# Patient Record
Sex: Male | Born: 1955 | Race: Black or African American | Hispanic: No | Marital: Single | Smoking: Former smoker
Health system: Southern US, Community
[De-identification: ages and names within clinical notes are randomized; demographics above are authoritative.]

## PROBLEM LIST (undated history)

## (undated) DIAGNOSIS — J349 Unspecified disorder of nose and nasal sinuses: Secondary | ICD-10-CM

## (undated) DIAGNOSIS — M549 Dorsalgia, unspecified: Secondary | ICD-10-CM

## (undated) DIAGNOSIS — I10 Essential (primary) hypertension: Secondary | ICD-10-CM

## (undated) DIAGNOSIS — M1990 Unspecified osteoarthritis, unspecified site: Secondary | ICD-10-CM

## (undated) DIAGNOSIS — R609 Edema, unspecified: Secondary | ICD-10-CM

## (undated) DIAGNOSIS — E119 Type 2 diabetes mellitus without complications: Secondary | ICD-10-CM

## (undated) DIAGNOSIS — T7840XA Allergy, unspecified, initial encounter: Secondary | ICD-10-CM

## (undated) HISTORY — PX: NO PAST SURGERIES: SHX2092

---

## 2000-11-26 ENCOUNTER — Encounter: Payer: Self-pay | Admitting: Emergency Medicine

## 2000-11-26 ENCOUNTER — Emergency Department (HOSPITAL_COMMUNITY): Admission: EM | Admit: 2000-11-26 | Discharge: 2000-11-26 | Payer: Self-pay | Admitting: Emergency Medicine

## 2008-07-15 ENCOUNTER — Emergency Department (HOSPITAL_COMMUNITY): Admission: EM | Admit: 2008-07-15 | Discharge: 2008-07-15 | Payer: Self-pay | Admitting: Emergency Medicine

## 2013-10-04 ENCOUNTER — Emergency Department (HOSPITAL_COMMUNITY): Payer: Medicaid Other

## 2013-10-04 ENCOUNTER — Observation Stay (HOSPITAL_COMMUNITY)
Admission: EM | Admit: 2013-10-04 | Discharge: 2013-10-06 | Disposition: A | Discharge status: Home / Self Care | Payer: Medicaid Other | Attending: Internal Medicine | Admitting: Internal Medicine

## 2013-10-04 ENCOUNTER — Encounter (HOSPITAL_COMMUNITY): Payer: Self-pay | Admitting: Emergency Medicine

## 2013-10-04 DIAGNOSIS — E43 Unspecified severe protein-calorie malnutrition: Secondary | ICD-10-CM | POA: Diagnosis present

## 2013-10-04 DIAGNOSIS — R5381 Other malaise: Secondary | ICD-10-CM | POA: Diagnosis not present

## 2013-10-04 DIAGNOSIS — L4050 Arthropathic psoriasis, unspecified: Secondary | ICD-10-CM

## 2013-10-04 DIAGNOSIS — IMO0002 Reserved for concepts with insufficient information to code with codable children: Secondary | ICD-10-CM

## 2013-10-04 DIAGNOSIS — R110 Nausea: Secondary | ICD-10-CM | POA: Insufficient documentation

## 2013-10-04 DIAGNOSIS — E860 Dehydration: Secondary | ICD-10-CM | POA: Diagnosis present

## 2013-10-04 DIAGNOSIS — R509 Fever, unspecified: Secondary | ICD-10-CM

## 2013-10-04 DIAGNOSIS — L511 Stevens-Johnson syndrome: Secondary | ICD-10-CM

## 2013-10-04 DIAGNOSIS — Z720 Tobacco use: Secondary | ICD-10-CM | POA: Insufficient documentation

## 2013-10-04 DIAGNOSIS — Z87891 Personal history of nicotine dependence: Secondary | ICD-10-CM | POA: Diagnosis not present

## 2013-10-04 DIAGNOSIS — R5383 Other fatigue: Secondary | ICD-10-CM

## 2013-10-04 DIAGNOSIS — E876 Hypokalemia: Secondary | ICD-10-CM | POA: Diagnosis present

## 2013-10-04 DIAGNOSIS — R21 Rash and other nonspecific skin eruption: Secondary | ICD-10-CM

## 2013-10-04 DIAGNOSIS — R000 Tachycardia, unspecified: Secondary | ICD-10-CM | POA: Insufficient documentation

## 2013-10-04 DIAGNOSIS — E870 Hyperosmolality and hypernatremia: Secondary | ICD-10-CM

## 2013-10-04 DIAGNOSIS — E871 Hypo-osmolality and hyponatremia: Secondary | ICD-10-CM | POA: Diagnosis present

## 2013-10-04 LAB — CBC WITH DIFFERENTIAL/PLATELET
Basophils Absolute: 0 10*3/uL (ref 0.0–0.1)
Basophils Relative: 1 % (ref 0–1)
Eosinophils Absolute: 0 10*3/uL (ref 0.0–0.7)
Eosinophils Relative: 0 % (ref 0–5)
HCT: 39.7 % (ref 39.0–52.0)
Hemoglobin: 13.7 g/dL (ref 13.0–17.0)
Lymphocytes Relative: 11 % — ABNORMAL LOW (ref 12–46)
Lymphs Abs: 0.7 10*3/uL (ref 0.7–4.0)
MCH: 28.4 pg (ref 26.0–34.0)
MCHC: 34.5 g/dL (ref 30.0–36.0)
MCV: 82.2 fL (ref 78.0–100.0)
Monocytes Absolute: 0.8 10*3/uL (ref 0.1–1.0)
Monocytes Relative: 13 % — ABNORMAL HIGH (ref 3–12)
Neutro Abs: 5 10*3/uL (ref 1.7–7.7)
Neutrophils Relative %: 75 % (ref 43–77)
Platelets: 256 10*3/uL (ref 150–400)
RBC: 4.83 MIL/uL (ref 4.22–5.81)
RDW: 12.5 % (ref 11.5–15.5)
WBC: 6.6 10*3/uL (ref 4.0–10.5)

## 2013-10-04 LAB — RAPID URINE DRUG SCREEN, HOSP PERFORMED
AMPHETAMINES: NOT DETECTED
Barbiturates: NOT DETECTED
Benzodiazepines: NOT DETECTED
Cocaine: NOT DETECTED
Opiates: NOT DETECTED
TETRAHYDROCANNABINOL: NOT DETECTED

## 2013-10-04 LAB — URINALYSIS, ROUTINE W REFLEX MICROSCOPIC
Bilirubin Urine: NEGATIVE
Glucose, UA: NEGATIVE mg/dL
Hgb urine dipstick: NEGATIVE
KETONES UR: 40 mg/dL — AB
LEUKOCYTES UA: NEGATIVE
Nitrite: NEGATIVE
PROTEIN: 100 mg/dL — AB
Specific Gravity, Urine: 1.014 (ref 1.005–1.030)
Urobilinogen, UA: 4 mg/dL — ABNORMAL HIGH (ref 0.0–1.0)
pH: 5.5 (ref 5.0–8.0)

## 2013-10-04 LAB — COMPREHENSIVE METABOLIC PANEL
ALT: 50 U/L (ref 0–53)
AST: 109 U/L — ABNORMAL HIGH (ref 0–37)
Albumin: 2.2 g/dL — ABNORMAL LOW (ref 3.5–5.2)
Alkaline Phosphatase: 79 U/L (ref 39–117)
BUN: 14 mg/dL (ref 6–23)
CO2: 24 mEq/L (ref 19–32)
Calcium: 8.8 mg/dL (ref 8.4–10.5)
Chloride: 94 mEq/L — ABNORMAL LOW (ref 96–112)
Creatinine, Ser: 0.83 mg/dL (ref 0.50–1.35)
GFR calc Af Amer: >90 mL/min (ref >90)
GFR calc non Af Amer: >90 mL/min (ref >90)
Glucose, Bld: 114 mg/dL — ABNORMAL HIGH (ref 70–99)
Potassium: 3.9 mEq/L (ref 3.7–5.3)
Sodium: 133 mEq/L — ABNORMAL LOW (ref 137–147)
Total Bilirubin: 0.8 mg/dL (ref 0.3–1.2)
Total Protein: 7.9 g/dL (ref 6.0–8.3)

## 2013-10-04 LAB — SEDIMENTATION RATE: Sed Rate: 95 mm/hr — ABNORMAL HIGH (ref 0–16)

## 2013-10-04 LAB — URINE MICROSCOPIC-ADD ON

## 2013-10-04 LAB — CBG MONITORING, ED
GLUCOSE-CAPILLARY: 120 mg/dL — AB (ref 70–99)
Glucose-Capillary: 118 mg/dL — ABNORMAL HIGH (ref 70–99)

## 2013-10-04 LAB — RAPID HIV SCREEN (WH-MAU): Rapid HIV Screen: NONREACTIVE

## 2013-10-04 LAB — HIV ANTIBODY (ROUTINE TESTING W REFLEX): HIV 1&2 Ab, 4th Generation: NONREACTIVE

## 2013-10-04 LAB — RAPID STREP SCREEN (MED CTR MEBANE ONLY): Streptococcus, Group A Screen (Direct): NEGATIVE

## 2013-10-04 LAB — RPR

## 2013-10-04 LAB — I-STAT CG4 LACTIC ACID, ED: LACTIC ACID, VENOUS: 2.25 mmol/L — AB (ref 0.5–2.2)

## 2013-10-04 MED ORDER — SODIUM CHLORIDE 0.9 % IV BOLUS (SEPSIS)
2000.0000 mL | Freq: Once | INTRAVENOUS | Status: AC
  Administered 2013-10-04: 2000 mL via INTRAVENOUS

## 2013-10-04 MED ORDER — ONDANSETRON HCL 4 MG/2ML IJ SOLN: 4.0000 mg | Freq: Four times a day (QID) | INTRAMUSCULAR | Status: DC | PRN

## 2013-10-04 MED ORDER — ALBUTEROL SULFATE (2.5 MG/3ML) 0.083% IN NEBU: 2.5000 mg | INHALATION_SOLUTION | RESPIRATORY_TRACT | Status: DC | PRN

## 2013-10-04 MED ORDER — SODIUM CHLORIDE 0.9 % IV SOLN
INTRAVENOUS | Status: AC
  Administered 2013-10-04 – 2013-10-05 (×3): via INTRAVENOUS

## 2013-10-04 MED ORDER — ACETAMINOPHEN 500 MG PO TABS
1000.0000 mg | ORAL_TABLET | Freq: Once | ORAL | Status: AC
  Administered 2013-10-04: 1000 mg via ORAL
  Filled 2013-10-04: qty 2

## 2013-10-04 MED ORDER — ONDANSETRON HCL 4 MG PO TABS: 4.0000 mg | ORAL_TABLET | Freq: Four times a day (QID) | ORAL | Status: DC | PRN

## 2013-10-04 NOTE — ED Notes (Signed)
CBG 120 

## 2013-10-04 NOTE — ED Notes (Signed)
Hospitalist MD at bedside for evaluation.

## 2013-10-04 NOTE — ED Notes (Addendum)
Pt reports no medical hx, thinks he might be diabetic. Pt reports he had a sinus infection 3 weeks ago. Took OTC meds, broke out in a rash, rash went away and now is back. Diffuse rash. Reports body aches 5/10. Pt has not been eating in 2 weeks. A table spoon of soup once or twice a day.

## 2013-10-04 NOTE — Consult Note (Signed)
[facility]    Date of Admission:  10/04/2013  Date of Consult:  10/04/2013  Reason for Consult: fever and a rash Referring Physician: Dr. Algis Liming   HPI: Lawrence Jennings is an 58 y.o. male with no significant PMHX self diagnosed with sinusitis 3 weeks ago and took 4 different over-the-counter medications (cannot recollect names of any) and the next day noticed rash on the face. He stopped taking the medications and 2 days later, the rash began to improve. However a couple days later, patient noticed recurrence of rash on his face, scalp ears, underneath chin, back,  penis.   It is now present extensively on his back and has spread to his  Legs. He has a few lesions on his soles as well.   On exam he has exudate in posterior oropharynx.    They apparently developed as red areas without swelling, no pruritus or pain and skin eventually peels.   He has poor appetite for 2 weeks and has not eaten or drank much. He denies sores in his mouth or eyes. No difficulty swallowing. He has subjective fevers and chills. Denies headache, earache or sore throat. No cough, dyspnea or chest pain. No nausea, vomiting, abdominal pain or diarrhea. No dysuria or urinary frequency. No sickly contacts.     He lives with his sister and her 32 year old son, neither of which have been ill.   Sister did recently brought in a new cat 3 weeks ago but no history of the cat scratching or biting him and he has no history of allergy to cats.  No recent travel history. 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 He is currently a Optometrist in a church. He does admit to having been sexually active with other men and women but has not had sex for years. He denies hx of STDs.   . In the ED, patient had low-grade temperature of 67F, mild tachycardia in the low 100s, lab work unremarkable except for mild hyponatremia. Elevated AST with normal ALT,  normal bilirubin and alk phosph. CBC with normal WBC hgb, platelets, ANC at 0.7 barely in normal range,  No significant Eos.  Chest and neck x-ray without acute findings. HIV rapid screen negative. RPR nonreactive (but PROZONE not checked)  UA negative Lactate mildly elevated at 2.25.     History reviewed. No pertinent past medical history.  No PMHX  History reviewed. No pertinent past surgical history.ergies:  No surgeries  No Known Allergies  NKDA  Medications: I have reviewed patients current medications as documented in Epic. No medications Anti-infectives   None      Social History:  reports that he has been smoking Cigarettes.  He has a 5 pack-year smoking history. He has never used smokeless tobacco. He reports that he does not drink alcohol or use illicit drugs.  History reviewed. No pertinent family history. No hx of CTD, RA.  As in HPI and primary teams notes otherwise 12 point review of systems is negative  Blood pressure 112/73, pulse 103, temperature 99.8 F (37.7 C), temperature source Oral, resp. rate 18, SpO2 97.00%. General: Alert and awake, oriented x3, not in any acute distress. HEENT: anicteric sclera,, EOMI, oropharynx +  Exudate  See below:      CVS tachy rate, normal r,  no murmur rubs or gallops Chest: clear to auscultation bilaterally, no wheezing, rales or rhonchi Abdomen: soft nontender, nondistended, normal bowel sounds,  Extremities: no  clubbing or edema noted bilaterally Skin: see pictures  Rash on face: Macular with confluent areas, scaling, exfolating  lesion on scalp            Back involved fairly extensively      But chest and trunk relatively spared     Hands with exfolative lesions:     Penis and scrotum with  Intense erythema and exfoliative rash                Feet with few lesions:      Legs with fresh lesions:       Neuro: nonfocal, strength and sensation intact    Results  for orders placed during the hospital encounter of 10/04/13 (from the past 48 hour(s))  CBG MONITORING, ED     Status: Abnormal   Collection Time    10/04/13  9:29 AM      Result Value Ref Range   Glucose-Capillary 120 (*) 70 - 99 mg/dL  CBG MONITORING, ED     Status: Abnormal   Collection Time    10/04/13  9:52 AM      Result Value Ref Range   Glucose-Capillary 118 (*) 70 - 99 mg/dL  CBC WITH DIFFERENTIAL     Status: Abnormal   Collection Time    10/04/13 10:05 AM      Result Value Ref Range   WBC 6.6  4.0 - 10.5 K/uL   RBC 4.83  4.22 - 5.81 MIL/uL   Hemoglobin 13.7  13.0 - 17.0 g/dL   HCT 39.7  39.0 - 52.0 %   MCV 82.2  78.0 - 100.0 fL   MCH 28.4  26.0 - 34.0 pg   MCHC 34.5  30.0 - 36.0 g/dL   RDW 12.5  11.5 - 15.5 %   Platelets 256  150 - 400 K/uL   Neutrophils Relative % 75  43 - 77 %   Neutro Abs 5.0  1.7 - 7.7 K/uL   Lymphocytes Relative 11 (*) 12 - 46 %   Lymphs Abs 0.7  0.7 - 4.0 K/uL   Monocytes Relative 13 (*) 3 - 12 %   Monocytes Absolute 0.8  0.1 - 1.0 K/uL   Eosinophils Relative 0  0 - 5 %   Eosinophils Absolute 0.0  0.0 - 0.7 K/uL   Basophils Relative 1  0 - 1 %   Basophils Absolute 0.0  0.0 - 0.1 K/uL  COMPREHENSIVE METABOLIC PANEL     Status: Abnormal   Collection Time    10/04/13 10:05 AM      Result Value Ref Range   Sodium 133 (*) 137 - 147 mEq/L   Potassium 3.9  3.7 - 5.3 mEq/L   Chloride 94 (*) 96 - 112 mEq/L   CO2 24  19 - 32 mEq/L   Glucose, Bld 114 (*) 70 - 99 mg/dL   BUN 14  6 - 23 mg/dL   Creatinine, Ser 0.83  0.50 - 1.35 mg/dL   Calcium 8.8  8.4 - 10.5 mg/dL   Total Protein 7.9  6.0 - 8.3 g/dL   Albumin 2.2 (*) 3.5 - 5.2 g/dL   AST 109 (*) 0 - 37 U/L   ALT 50  0 - 53 U/L   Alkaline Phosphatase 79  39 - 117 U/L   Total Bilirubin 0.8  0.3 - 1.2 mg/dL   GFR calc non Af Amer >90  >90 mL/min   GFR calc Af Amer >90  >  90 mL/min   Comment: (NOTE)     The eGFR has been calculated using the CKD EPI equation.     This calculation has not been  validated in all clinical situations.     eGFR's persistently <90 mL/min signify possible Chronic Kidney     Disease.  RAPID HIV SCREEN [facility])     Status: None   Collection Time    10/04/13 10:05 AM      Result Value Ref Range   SUDS Rapid HIV Screen NON REACTIVE  NON REACTIVE   Comment: RESULT CALLED TO, READ BACK BY AND VERIFIED WITH:     YAO,D MD @ 1107 [phone] Modena Jansky  RPR     Status: None   Collection Time    10/04/13 10:05 AM      Result Value Ref Range   RPR NON REAC  NON REAC   Comment: Performed at [facility] ACID, ED     Status: Abnormal   Collection Time    10/04/13 10:26 AM      Result Value Ref Range   Lactic Acid, Venous 2.25 (*) 0.5 - 2.2 mmol/L  URINALYSIS, ROUTINE W REFLEX MICROSCOPIC     Status: Abnormal   Collection Time    10/04/13 10:42 AM      Result Value Ref Range   Color, Urine AMBER (*) YELLOW   Comment: BIOCHEMICALS MAY BE AFFECTED BY COLOR   APPearance CLOUDY (*) CLEAR   Specific Gravity, Urine 1.014  1.005 - 1.030   pH 5.5  5.0 - 8.0   Glucose, UA NEGATIVE  NEGATIVE mg/dL   Hgb urine dipstick NEGATIVE  NEGATIVE   Bilirubin Urine NEGATIVE  NEGATIVE   Ketones, ur 40 (*) NEGATIVE mg/dL   Protein, ur 100 (*) NEGATIVE mg/dL   Urobilinogen, UA 4.0 (*) 0.0 - 1.0 mg/dL   Nitrite NEGATIVE  NEGATIVE   Leukocytes, UA NEGATIVE  NEGATIVE  URINE MICROSCOPIC-ADD ON     Status: Abnormal   Collection Time    10/04/13 10:42 AM      Result Value Ref Range   Squamous Epithelial / LPF RARE  RARE   Casts HYALINE CASTS (*) NEGATIVE  URINE RAPID DRUG SCREEN (HOSP PERFORMED)     Status: None   Collection Time    10/04/13 11:53 AM      Result Value Ref Range   Opiates NONE DETECTED  NONE DETECTED   Cocaine NONE DETECTED  NONE DETECTED   Benzodiazepines NONE DETECTED  NONE DETECTED   Amphetamines NONE DETECTED  NONE DETECTED   Tetrahydrocannabinol NONE DETECTED  NONE DETECTED   Barbiturates NONE DETECTED  NONE DETECTED    Comment:            DRUG SCREEN FOR MEDICAL PURPOSES     ONLY.  IF CONFIRMATION IS NEEDED     FOR ANY PURPOSE, NOTIFY LAB     WITHIN 5 DAYS.                LOWEST DETECTABLE LIMITS     FOR URINE DRUG SCREEN     Drug Class       Cutoff (ng/mL)     Amphetamine      1000     Barbiturate      200     Benzodiazepine   379     Tricyclics       444     Opiates  300     Cocaine          300     THC              50   No results found for this basename: sdes, specrequest, cult, reptstatus   Dg Neck Soft Tissue  10/04/2013   CLINICAL DATA:  Neck pain fever  EXAM: NECK SOFT TISSUES - 1+ VIEW  COMPARISON:  None.  FINDINGS: There is no evidence of retropharyngeal soft tissue swelling or epiglottic enlargement. The cervical airway is unremarkable and no radio-opaque foreign body identified.  IMPRESSION: Negative.   Electronically Signed   By: Margaree Mackintosh M.D.   On: 10/04/2013 10:30   Dg Chest 2 View  10/04/2013   CLINICAL DATA:  Rash and cough.  EXAM: CHEST - 2 VIEW  COMPARISON:  None  FINDINGS: The heart size and mediastinal contours are within normal limits. Mild pulmonary interstitial prominence and scarring bilaterally is likely consistent with chronic disease. There is no evidence of pulmonary edema, consolidation, pneumothorax, nodule or pleural fluid. The visualized skeletal structures are unremarkable.  IMPRESSION: No active disease.  Probable mild chronic lung disease.   Electronically Signed   By: Aletta Edouard M.D.   On: 10/04/2013 10:25     No results found for this or any previous visit (from the past 720 hour(s)).   Impression/Recommendation  Principal Problem:   Rash Active Problems:   Dehydration   Tobacco abuse   Valton Schwartz is a 58 y.o. male previously healthy but now with onset of rash on face (that preceded by sinus congestion and ingestion of 4 different OTC meds) brief improvement off meds then with progression on face and emergence of rash on palms,  penis, scrotum back, legs. Parts of the rash have exfoliative component. He has exudate in posterior oropharynx.  #1 Fever and rash:  Differential diagnosis is fairly broad. Could this be a Steven's Johnson's type rash post Mycoplasma infection? Could have have post streptococcal Scarlet fever rash. He doesn't have many other features c/w Scarlet fever. Certainly one could worry about other viruses that can present this way including Coxsackie virus infection.   This could certainly be primary HIV and Syphilis also on the table (need to check for prozone)  Disseminated GC possible  Viral hepatitis also needs to be considered  Finally Behcet's disease and other AI phenemona need consideration including Levimasole toxicity from impure cocaine  --I will check rapid strep screen from throat, throat culture, GC culture from throat, GC and chlamydia from urine --send HIV RNA , HIV ab sent, resent RPR But check for prozone --check Cold agglutinins, Mycoplasma abs, Coxsackie virus antibody panel --check ESR, CRP, ANA, RF,  --check acute hepatitis panel  Not clear that a specific therapeutic intervention is needed at this point but would be VERY helpful to have Dermatology help I will see if I can talk to Dermatologist who accesses Epic to review images and clniical story  There had been concern for Measles voiced by IP but pattern of rash and illness not c/w that.  I spent greater than 60 minutes with the patient including greater than 50% of time in face to face counsel of the patient and in coordination of their care.    10/04/2013, 2:23 PM   Thank you so much for this interesting consult  [facility] [phone] (pager) [phone] (office) 10/04/2013, 2:23 PM  [place] 10/04/2013, 2:23 PM

## 2013-10-04 NOTE — H&P (Addendum)
History and Physical  Lawrence Jennings LNL:[id number] DOB: May 21, 1955 DOA: 10/04/2013  Referring physician: EDP PCP: No primary provider on file.  Outpatient Specialists:  1. None  Chief Complaint: Skin rash, poor oral intake and weakness.  HPI: Lawrence Jennings is a 58 y.o. male with no known PMH, self diagnosed with sinusitis 3 weeks ago and took 4 different over-the-counter medications (cannot recollect names of any) and the next day noticed rash on the face. We stop taking the medications and 2 days later, the rash spontaneously resolved. However a couple days later, patient noticed recurrence of rash on his face, ears, underneath chin, back, lower extremities and penis. They apparently developed as red areas without swelling, no pruritus or pain and skin eventually peels. He has poor appetite for 2 weeks and has not eaten or drank much. He denies sores in his mouth or eyes. No difficulty swallowing. He has subjective fevers and chills. Denies headache, earache or sore throat. No cough, dyspnea or chest pain. No nausea, vomiting, abdominal pain or diarrhea. No dysuria or urinary frequency. No sickly contacts. He lives with his sister who recently brought in a new cat 3 weeks ago but no history of the cat scratching or biting him. No recent travel history. Volunteers that he is bisexual but has been sexually inactive for many years. In the ED, patient had low-grade temperature of 79F, mild tachycardia in the low 100s, lab work unremarkable except for mild hyponatremia. Chest and neck x-ray without acute findings. HIV screen negative. RPR nonreactive. UA without features of UTI. Lactate mildly elevated at 2.25. Hospitalist admission requested for possible infectious etiology of his presentation.   Review of Systems: All systems reviewed and apart from history of presenting illness, are negative.  History reviewed. No pertinent past medical history. History reviewed. No pertinent past surgical  history. Social History:  reports that he has been smoking Cigarettes.  He has a 5 pack-year smoking history. He has never used smokeless tobacco. He reports that he does not drink alcohol or use illicit drugs.he states that he has not smoked in 3 weeks. Single. Lives with sister and is independent of activities of daily living.    No Known Allergies  History reviewed. No pertinent family history.  negative family history.  Prior to Admission medications   Not on File   Physical Exam: Filed Vitals:   10/04/13 1215 10/04/13 1230 10/04/13 1245 10/04/13 1300  BP: 110/65 111/79 110/73 112/73  Pulse: 105 100 99 103  Temp:      TempSrc:      Resp: 16 19 15 18   SpO2: 92% 96% 95% 97%     General exam: Moderately built and nourished  middle-aged male patient, lying comfortably supine on the gurney in no obvious distress. Does not look septic or toxic.  Head, eyes and ENT: Nontraumatic and normocephalic. Pupils equally reacting to light and accommodation. Oral mucosa  dry .  Neck: Supple. No JVD, carotid bruit or thyromegaly.  Lymphatics: No lymphadenopathy.  Respiratory system: Clear to auscultation. No increased work of breathing.  Cardiovascular system: S1 and S2 heard, RRR. No JVD, murmurs, gallops, clicks or pedal edema.  Gastrointestinal system: Abdomen is nondistended, soft and nontender. Normal bowel sounds heard. No organomegaly or masses appreciated.  Central nervous system: Alert and oriented. No focal neurological deficits.  Extremities: Symmetric 5 x 5 power. Peripheral pulses symmetrically felt.   Skin:  Patient has multiple rashes on his face, ears, underneath chin, scalp, back , legs,  palms and penis. These are at different stages of healing. The rash appears macular, mild erythema on some of them, dry, few of them scaly-especially on bilateral external ears, elbows, palms and penis (on skin/prepuce). No drainage.  Musculoskeletal system: Negative exam.  Psychiatry:  Pleasant and cooperative.   Labs on Admission:  Basic Metabolic Panel:  Recent Labs Lab 10/04/13 1005  NA 133*  K 3.9  CL 94*  CO2 24  GLUCOSE 114*  BUN 14  CREATININE 0.83  CALCIUM 8.8   Liver Function Tests:  Recent Labs Lab 10/04/13 1005  AST 109*  ALT 50  ALKPHOS 79  BILITOT 0.8  PROT 7.9  ALBUMIN 2.2*   No results found for this basename: LIPASE, AMYLASE,  in the last 168 hours No results found for this basename: AMMONIA,  in the last 168 hours CBC:  Recent Labs Lab 10/04/13 1005  WBC 6.6  NEUTROABS 5.0  HGB 13.7  HCT 39.7  MCV 82.2  PLT 256   Cardiac Enzymes: No results found for this basename: CKTOTAL, CKMB, CKMBINDEX, TROPONINI,  in the last 168 hours  BNP (last 3 results) No results found for this basename: PROBNP,  in the last 8760 hours CBG:  Recent Labs Lab 10/04/13 0929 10/04/13 0952  GLUCAP 120* 118*    Radiological Exams on Admission: Dg Neck Soft Tissue  10/04/2013   CLINICAL DATA:  Neck pain fever  EXAM: NECK SOFT TISSUES - 1+ VIEW  COMPARISON:  None.  FINDINGS: There is no evidence of retropharyngeal soft tissue swelling or epiglottic enlargement. The cervical airway is unremarkable and no radio-opaque foreign body identified.  IMPRESSION: Negative.   Electronically Signed   By: Margaree Mackintosh M.D.   On: 10/04/2013 10:30   Dg Chest 2 View  10/04/2013   CLINICAL DATA:  Rash and cough.  EXAM: CHEST - 2 VIEW  COMPARISON:  None  FINDINGS: The heart size and mediastinal contours are within normal limits. Mild pulmonary interstitial prominence and scarring bilaterally is likely consistent with chronic disease. There is no evidence of pulmonary edema, consolidation, pneumothorax, nodule or pleural fluid. The visualized skeletal structures are unremarkable.  IMPRESSION: No active disease.  Probable mild chronic lung disease.   Electronically Signed   By: Aletta Edouard M.D.   On: 10/04/2013 10:25    EKG: Independently reviewed.  Sinus  tachycardia at 120 beats per minute without acute changes.  Assessment/Plan Principal Problem:   Rash Active Problems:   Dehydration   Tobacco abuse   1. Skin rash: Developed 2-3 weeks ago post OTC medications for sinusitis, associated subjective fever and chills, poor appetite, new cat at home. DD-drug reaction/erythema multiforme, infectious etiology (seems less likely), psoriasis versus seborrheic dermatitis. Admit for observation. HIV screen and RPR negative. Chest x-ray and UA unremarkable. ID consulted by ED-await recommendations. No antimicrobials at this time. 2. Dehydration with mild hyponatremia: Secondary to poor oral intake. No mucosal lesions. Hydrate IV. Diet as tolerated. 3. Tobacco abuse: Cessation counseled.   ,  Code Status:  Full  Family Communication:  None at bedside  Disposition Plan:  home when medically stable   Time spent:  50 minutes  Modena Jansky, MD, FACP, [facility]. [facility] Pager [phone]  If 7PM-7AM, please contact night-coverage [email or web address] Password [email or web address] 10/04/2013, 1:31 PM

## 2013-10-04 NOTE — ED Notes (Signed)
On preparing to transport patient, this RN noticed bed placement had been changed. Called to [facility] [place] to give report to new nurse, [facility] [place] states they do not have tele

## 2013-10-04 NOTE — Progress Notes (Signed)
Dr Waymon Amato paged to notify of pt's arrival to unit as ordered.

## 2013-10-04 NOTE — ED Notes (Signed)
MD at bedside. 

## 2013-10-04 NOTE — ED Notes (Signed)
Results of the IStat Lactic given to Dr Silverio Lay

## 2013-10-04 NOTE — ED Notes (Signed)
Delay in transport due to patient bed request changing from telemetry to medical/surgical.

## 2013-10-04 NOTE — ED Provider Notes (Addendum)
CSN: [id number]     Arrival date & time 10/04/13  0906 History   First MD Initiated Contact with Patient 10/04/13 0912     Chief Complaint  Patient presents with  . Rash  . Fever     (Consider location/radiation/quality/duration/timing/severity/associated sxs/prior Treatment) The history is provided by the patient.  Lawrence Jennings is a 58 y.o. male here with rash, fever, dehydration. He had sinus congestion and self diagnosed with sinusitis and took some over the counter decongestants. About 2 days afterwards, had some rash on his face. After several days, it went away. Had some hoarseness in his voice and not eating much during that time. Since yesterday, noticed worsening rash now spreading to his torso and legs. Also trouble drinking for the last 2 weeks. No vomiting or abdominal pain. Had some chills and myalgias. Denies history of HIV or syphilis. Denies recent travel or tick bites. Denies any known allergies.    History reviewed. No pertinent past medical history. History reviewed. No pertinent past surgical history. History reviewed. No pertinent family history. History  Substance Use Topics  . Smoking status: Former Smoker -- 20 years    Types: Cigarettes  . Smokeless tobacco: Not on file  . Alcohol Use: No    Review of Systems  Constitutional: Positive for fever and fatigue.  Gastrointestinal: Positive for nausea.  Skin: Positive for rash.  Neurological: Positive for weakness.  All other systems reviewed and are negative.     Allergies  Review of patient's allergies indicates no known allergies.  Home Medications   Prior to Admission medications   Not on File   BP 105/69  Pulse 109  Temp(Src) 99.8 F (37.7 C) (Oral)  Resp 18  SpO2 93% Physical Exam  Nursing note and vitals reviewed. Constitutional: He is oriented to person, place, and time.  Dehydrated   HENT:  Head: Normocephalic.  MM dry   Eyes: Conjunctivae and EOM are normal. Pupils are equal,  round, and reactive to light.  Neck: Normal range of motion. Neck supple.  Cardiovascular: Normal rate, regular rhythm and normal heart sounds.   Pulmonary/Chest: Effort normal and breath sounds normal. No respiratory distress. He has no wheezes. He has no rales.  Abdominal: Soft. Bowel sounds are normal. He exhibits no distension. There is no tenderness. There is no rebound and no guarding.  Musculoskeletal: Normal range of motion. He exhibits no edema and no tenderness.  Neurological: He is alert and oriented to person, place, and time. No cranial nerve deficit. Coordination normal.  Skin:  Macular papular rash mainly on face and R earbut also on torso and legs with scabs. No surrounding cellulitis or abscess. No obvious involvement on lips or eyes. He does have dry scabs on the tip of his penis.   Psychiatric: He has a normal mood and affect. His behavior is normal. Judgment and thought content normal.    ED Course  Procedures (including critical care time) Labs Review Labs Reviewed  CBC WITH DIFFERENTIAL - Abnormal; Notable for the following:    Lymphocytes Relative 11 (*)    Monocytes Relative 13 (*)    All other components within normal limits  COMPREHENSIVE METABOLIC PANEL - Abnormal; Notable for the following:    Sodium 133 (*)    Chloride 94 (*)    Glucose, Bld 114 (*)    Albumin 2.2 (*)    AST 109 (*)    All other components within normal limits  URINALYSIS, ROUTINE W REFLEX MICROSCOPIC - Abnormal;  Notable for the following:    Color, Urine AMBER (*)    APPearance CLOUDY (*)    Ketones, ur 40 (*)    Protein, ur 100 (*)    Urobilinogen, UA 4.0 (*)    All other components within normal limits  URINE MICROSCOPIC-ADD ON - Abnormal; Notable for the following:    Casts HYALINE CASTS (*)    All other components within normal limits  I-STAT CG4 LACTIC ACID, ED - Abnormal; Notable for the following:    Lactic Acid, Venous 2.25 (*)    All other components within normal limits   CBG MONITORING, ED - Abnormal; Notable for the following:    Glucose-Capillary 118 (*)    All other components within normal limits  CULTURE, BLOOD (ROUTINE X 2)  CULTURE, BLOOD (ROUTINE X 2)  URINE CULTURE  RAPID HIV SCREEN ([facility])  RPR  HIV ANTIBODY (ROUTINE TESTING)    Imaging Review Dg Neck Soft Tissue  10/04/2013   CLINICAL DATA:  Neck pain fever  EXAM: NECK SOFT TISSUES - 1+ VIEW  COMPARISON:  None.  FINDINGS: There is no evidence of retropharyngeal soft tissue swelling or epiglottic enlargement. The cervical airway is unremarkable and no radio-opaque foreign body identified.  IMPRESSION: Negative.   Electronically Signed   By: Salome Holmes M.D.   On: 10/04/2013 10:30   Dg Chest 2 View  10/04/2013   CLINICAL DATA:  Rash and cough.  EXAM: CHEST - 2 VIEW  COMPARISON:  None  FINDINGS: The heart size and mediastinal contours are within normal limits. Mild pulmonary interstitial prominence and scarring bilaterally is likely consistent with chronic disease. There is no evidence of pulmonary edema, consolidation, pneumothorax, nodule or pleural fluid. The visualized skeletal structures are unremarkable.  IMPRESSION: No active disease.  Probable mild chronic lung disease.   Electronically Signed   By: Irish Lack M.D.   On: 10/04/2013 10:25     EKG Interpretation   Date/Time:  Tuesday October 04 2013 09:46:01 EDT Ventricular Rate:  120 PR Interval:  139 QRS Duration: 70 QT Interval:  340 QTC Calculation: 480 R Axis:   77 Text Interpretation:  Sinus tachycardia Ventricular premature complex  Borderline T wave abnormalities Borderline prolonged QT interval No  previous ECGs available Confirmed by YAO  MD, DAVID ([id number]) on 10/04/2013  9:48:50 AM      MDM   Final diagnoses:  None   Lawrence Jennings is a 58 y.o. male here with rash. Also dehydrated and febrile and tachycardic. No meningeal signs so I doubt meningitis. Will do sepsis workup. Will hydrate patient. Also consider early  steven johnson vs TEN but not on any known meds that could cause it.   11:49 AM Tachycardia improved. Lactate slightly elevated but UA and CXR unremarkable. Hospitalist request ID consult. I called Dr. Daiva Eves, who will see patient. Will admit to tele.    Richardean Canal, MD 10/04/13 1153  Richardean Canal, MD 10/04/13 1154

## 2013-10-04 NOTE — Progress Notes (Signed)
[facility] provided pt with a list of primary care resources and a [facility] Orange Card application to help patient establish primary care.  °

## 2013-10-05 ENCOUNTER — Telehealth: Payer: Self-pay

## 2013-10-05 DIAGNOSIS — E876 Hypokalemia: Secondary | ICD-10-CM | POA: Diagnosis present

## 2013-10-05 DIAGNOSIS — E43 Unspecified severe protein-calorie malnutrition: Secondary | ICD-10-CM | POA: Diagnosis present

## 2013-10-05 DIAGNOSIS — R509 Fever, unspecified: Secondary | ICD-10-CM

## 2013-10-05 DIAGNOSIS — E870 Hyperosmolality and hypernatremia: Secondary | ICD-10-CM

## 2013-10-05 DIAGNOSIS — E871 Hypo-osmolality and hyponatremia: Secondary | ICD-10-CM | POA: Diagnosis present

## 2013-10-05 DIAGNOSIS — R21 Rash and other nonspecific skin eruption: Secondary | ICD-10-CM

## 2013-10-05 DIAGNOSIS — E860 Dehydration: Secondary | ICD-10-CM

## 2013-10-05 LAB — BASIC METABOLIC PANEL
BUN: 13 mg/dL (ref 6–23)
CHLORIDE: 105 meq/L (ref 96–112)
CO2: 24 mEq/L (ref 19–32)
Calcium: 7.6 mg/dL — ABNORMAL LOW (ref 8.4–10.5)
Creatinine, Ser: 0.67 mg/dL (ref 0.50–1.35)
GFR calc Af Amer: >90 mL/min (ref >90)
GFR calc non Af Amer: >90 mL/min (ref >90)
Glucose, Bld: 125 mg/dL — ABNORMAL HIGH (ref 70–99)
POTASSIUM: 3.4 meq/L — AB (ref 3.7–5.3)
Sodium: 139 mEq/L (ref 137–147)

## 2013-10-05 LAB — GC/CHLAMYDIA PROBE AMP
CT Probe RNA: NEGATIVE
GC Probe RNA: NEGATIVE

## 2013-10-05 LAB — DRUGS OF ABUSE SCREEN W/O ALC, ROUTINE URINE
AMPHETAMINE SCRN UR: NEGATIVE
BARBITURATE QUANT UR: NEGATIVE
BENZODIAZEPINES.: NEGATIVE
CREATININE, U: 86 mg/dL
Cocaine Metabolites: NEGATIVE
MARIJUANA METABOLITE: NEGATIVE
Methadone: NEGATIVE
Opiate Screen, Urine: NEGATIVE
Phencyclidine (PCP): NEGATIVE
Propoxyphene: NEGATIVE

## 2013-10-05 LAB — CBC
HEMATOCRIT: 32.1 % — AB (ref 39.0–52.0)
HEMOGLOBIN: 11 g/dL — AB (ref 13.0–17.0)
MCH: 28.6 pg (ref 26.0–34.0)
MCHC: 34.3 g/dL (ref 30.0–36.0)
MCV: 83.6 fL (ref 78.0–100.0)
Platelets: 207 10*3/uL (ref 150–400)
RBC: 3.84 MIL/uL — ABNORMAL LOW (ref 4.22–5.81)
RDW: 12.6 % (ref 11.5–15.5)
WBC: 4.1 10*3/uL (ref 4.0–10.5)

## 2013-10-05 LAB — HEPATITIS PANEL, ACUTE
HCV AB: NEGATIVE
Hep A IgM: NONREACTIVE
Hep B C IgM: NONREACTIVE
Hepatitis B Surface Ag: NEGATIVE

## 2013-10-05 LAB — ANTISTREPTOLYSIN O TITER: ASO: 157 IU/mL (ref <409)

## 2013-10-05 LAB — RHEUMATOID FACTOR

## 2013-10-05 LAB — C-REACTIVE PROTEIN: CRP: 8.5 mg/dL — ABNORMAL HIGH (ref <0.60)

## 2013-10-05 LAB — RPR

## 2013-10-05 LAB — HIV-1 RNA ULTRAQUANT REFLEX TO GENTYP+

## 2013-10-05 MED ORDER — ACETAMINOPHEN 650 MG RE SUPP: 650.0000 mg | Freq: Four times a day (QID) | RECTAL | Status: DC | PRN

## 2013-10-05 MED ORDER — ALUM & MAG HYDROXIDE-SIMETH 200-200-25 MG PO CHEW: 1.0000 | CHEWABLE_TABLET | Freq: Three times a day (TID) | ORAL | Status: DC | PRN

## 2013-10-05 MED ORDER — POTASSIUM CHLORIDE CRYS ER 20 MEQ PO TBCR
40.0000 meq | EXTENDED_RELEASE_TABLET | Freq: Once | ORAL | Status: AC
  Administered 2013-10-05: 40 meq via ORAL
  Filled 2013-10-05 (×2): qty 2

## 2013-10-05 MED ORDER — SIMETHICONE 80 MG PO CHEW
80.0000 mg | CHEWABLE_TABLET | Freq: Three times a day (TID) | ORAL | Status: DC | PRN
  Administered 2013-10-05: 80 mg via ORAL
  Filled 2013-10-05: qty 1

## 2013-10-05 MED ORDER — ACETAMINOPHEN 325 MG PO TABS: 650.0000 mg | ORAL_TABLET | Freq: Four times a day (QID) | ORAL | Status: DC | PRN

## 2013-10-05 MED ORDER — PREDNISONE 10 MG PO TABS
60.0000 mg | ORAL_TABLET | Freq: Every day | ORAL | Status: DC
Start: 2013-10-05 — End: 2013-10-06
  Administered 2013-10-05 – 2013-10-06 (×2): 60 mg via ORAL
  Filled 2013-10-05 (×2): qty 1

## 2013-10-05 MED ORDER — ENSURE COMPLETE PO LIQD
237.0000 mL | Freq: Two times a day (BID) | ORAL | Status: DC
  Administered 2013-10-05: 237 mL via ORAL

## 2013-10-05 MED ORDER — ENOXAPARIN SODIUM 40 MG/0.4ML ~~LOC~~ SOLN
40.0000 mg | SUBCUTANEOUS | Status: DC
  Administered 2013-10-05: 40 mg via SUBCUTANEOUS
  Filled 2013-10-05 (×2): qty 0.4

## 2013-10-05 NOTE — Progress Notes (Signed)
INITIAL NUTRITION ASSESSMENT  DOCUMENTATION CODES Per approved criteria  -Severe malnutrition in the context of acute illness or injury  Pt meets criteria for severe MALNUTRITION in the context of acute illness as evidenced by PO intake >50% for > 5 days, severe muscle wasting and subcutaneous fat loss, 15.6% body weight loss in < one month.   INTERVENTION: -Recommend strawberry Ensure Complete po BID, each supplement provides 350 kcal and 13 grams of protein -Discussed high protein/kcal snacks -Will provide supplement coupons as warranted -Will continue to monitor  NUTRITION DIAGNOSIS: Unintentional wt loss related to inadequate energy intake as evidenced by 15.6% body weight loss in 3 weeks.   Goal: Pt to meet >/= 90% of their estimated nutrition needs    Monitor:  Total protein/energy intake, labs, weights  Reason for Assessment: MST  58 y.o. male  Admitting Dx: Rash  ASSESSMENT: Lawrence Jennings is a 58 y.o. male with no known PMH, self diagnosed with sinusitis 3 weeks ago and took 4 different over-the-counter medications (cannot recollect names of any) and the next day noticed rash on the face. We stop taking the medications and 2 days later, the rash spontaneously resolved. However a couple days later, patient noticed recurrence of rash on his face, ears, underneath chin, back, lower extremities and penis. They apparently developed as red areas without swelling, no pruritus or pain and skin eventually peels. He has poor appetite for 2 weeks and has not eaten or drank much   -Pt endorsed an unintentional wt loss of 30 lbs (15.6% body weight loss) in 3 weeks -Reported unable to tolerate any food for past 2 weeks. Diet recall indicated pt largely consuming water and beverages. Wanted to try Ensure/Boost supplements, but did not have transportation/family support to purchase them for him. Noted that when he would try to eat, he would be unable to do so; however denied n/v/abd pain  and dysphagia. Pt would not further elaborate on origins for minimal PO intake. -Appetite improving during admit, consuming >90% of breakfast. Denied early satiety/n/v/abd pain post meals -Would benefit from Ensure for nutrient replenishment, pt prefers strawberry flavor. Encouraged pt to inform RN if he would like supplement coupons upon d/c. Discussed high protein/kcal snacks to add into diet as tolerated (yogurt, dairy, nuts) Nutrition Focused Physical Exam:  Subcutaneous Fat:  Orbital Region: WDL Upper Arm Region: moderate wasting Thoracic and Lumbar Region: WDL  Muscle:  Temple Region: moderate wasting Clavicle Bone Region: WDL Clavicle and Acromion Bone Region: moderate wasting Scapular Bone Region: moderate wasting  Dorsal Hand: WDL Patellar Region: severe wasting Anterior Thigh Region: severe wasting Posterior Calf Region: moderate wasting  Edema: none noted    Height: Ht Readings from Last 1 Encounters:  10/04/13 5\' 11"  (1.803 m)    Weight: Wt Readings from Last 1 Encounters:  10/05/13 162 lb 8 oz (73.71 kg)    Ideal Body Weight: 172 lbs  % Ideal Body Weight: 94%  Wt Readings from Last 10 Encounters:  10/05/13 162 lb 8 oz (73.71 kg)    Usual Body Weight: 190 lbs  % Usual Body Weight: 84%  BMI:  Body mass index is 22.67 kg/(m^2).  Estimated Nutritional Needs: Kcal: 1850-2150 Protein: 85-100 gram Fluid: >/=2200 ml/daily  Skin: Rash on arm, face, head, hand, back  Diet Order: General  EDUCATION NEEDS: -Education needs addressed   Intake/Output Summary (Last 24 hours) at 10/05/13 1034 Last data filed at 10/05/13 0525  Gross per 24 hour  Intake    240 ml  Output    625 ml  Net   -385 ml    Last BM: 6/02   Labs:   Recent Labs Lab 10/04/13 1005 10/05/13 0317  NA 133* 139  K 3.9 3.4*  CL 94* 105  CO2 24 24  BUN 14 13  CREATININE 0.83 0.67  CALCIUM 8.8 7.6*  GLUCOSE 114* 125*    CBG (last 3)   Recent Labs  10/04/13 0929  10/04/13 0952  GLUCAP 120* 118*    Scheduled Meds: . feeding supplement (ENSURE COMPLETE)  237 mL Oral BID BM    Continuous Infusions: . sodium chloride 125 mL/hr at 10/05/13 1014    History reviewed. No pertinent past medical history.  History reviewed. No pertinent past surgical history.  Lloyd Huger MS RD LDN Clinical Dietitian [phone]

## 2013-10-05 NOTE — Progress Notes (Signed)
CSW received referral that pt has no insurance.  CSW spoke with financial counselor who has already met with pt.  RNCM has met with pt to assist with PCP and medication needs.   No social work needs identified.  CSW signing off.   Please re-consult if social work needs arise.  Alison Murray, MSW, [place] [facility] [phone]

## 2013-10-05 NOTE — Progress Notes (Signed)
[facility]    Subjective: Skin peeling off more, feels "about the same"   Antibiotics:  Anti-infectives   None      Medications: Scheduled Meds: . enoxaparin (LOVENOX) injection  40 mg Subcutaneous Q24H  . feeding supplement (ENSURE COMPLETE)  237 mL Oral BID BM   Continuous Infusions: . sodium chloride 125 mL/hr at 10/05/13 1014   PRN Meds:.acetaminophen, acetaminophen, albuterol, ondansetron (ZOFRAN) IV, ondansetron, simethicone    Objective: Weight change:   Intake/Output Summary (Last 24 hours) at 10/05/13 1522 Last data filed at 10/05/13 0935  Gross per 24 hour  Intake    240 ml  Output    975 ml  Net   -735 ml   Blood pressure 106/77, pulse 106, temperature 100.6 F (38.1 C), temperature source Oral, resp. rate 18, height '5\' 11"'  (1.803 m), weight 162 lb 8 oz (73.71 kg), SpO2 100.00%. Temp:  [100.1 F (37.8 C)-100.6 F (38.1 C)] 100.6 F (38.1 C) (06/03 0522) Pulse Rate:  [106-107] 106 (06/03 0522) Resp:  [18] 18 (06/03 0522) BP: (106-118)/(76-77) 106/77 mmHg (06/03 0522) SpO2:  [100 %] 100 % (06/03 0522) Weight:  [162 lb 8 oz (73.71 kg)] 162 lb 8 oz (73.71 kg) (06/03 0522)  Physical Exam: General: Alert and awake, oriented x3, not in any acute distress. HEENT: anicteric sclera, pupils reactive to light and accommodation, EOMI CVS regular rate, normal r,  no murmur rubs or gallops Chest: clear to auscultation bilaterally, no wheezing, rales or rhonchi Abdomen: soft nontender, nondistended, normal bowel sounds, Extremities: no  clubbing or edema noted bilaterally Skin:  Face yesterday:         Today:    Hands yesterday:       Hands today:    Penis and scrotum yesterday          Today:        Elbows today:      Back yesterday       Today     Left ankle yesterday      Today        Neuro: nonfocal  CBC:  Recent Labs Lab 10/04/13 1005 10/05/13 0317    HGB 13.7 11.0*  HCT 39.7 32.1*  PLT 256 207     BMET  Recent Labs  10/04/13 1005 10/05/13 0317  NA 133* 139  K 3.9 3.4*  CL 94* 105  CO2 24 24  GLUCOSE 114* 125*  BUN 14 13  CREATININE 0.83 0.67  CALCIUM 8.8 7.6*     Liver Panel   Recent Labs  10/04/13 1005  PROT 7.9  ALBUMIN 2.2*  AST 109*  ALT 50  ALKPHOS 79  BILITOT 0.8       Sedimentation Rate  Recent Labs  10/04/13 1512  ESRSEDRATE 95*   C-Reactive Protein  Recent Labs  10/04/13 1512  CRP 8.5*    Micro Results: Recent Results (from the past 240 hour(s))  CULTURE, BLOOD (ROUTINE X 2)     Status: None   Collection Time    10/04/13 10:01 AM      Result Value Ref Range Status   Specimen Description BLOOD LEFT FOREARM   Final   Special Requests BOTTLES DRAWN AEROBIC AND ANAEROBIC 2 CC   Final   Culture  Setup Time     Final   Value: 10/04/2013 12:48     Performed at [facility]   Culture     Final   Value:  BLOOD CULTURE RECEIVED NO GROWTH TO DATE CULTURE WILL BE HELD FOR 5 DAYS BEFORE ISSUING A FINAL NEGATIVE REPORT     Performed at [facility]   Report Status PENDING   Incomplete  CULTURE, BLOOD (ROUTINE X 2)     Status: None   Collection Time    10/04/13 10:01 AM      Result Value Ref Range Status   Specimen Description BLOOD LEFT ARM   Final   Special Requests BOTTLES DRAWN AEROBIC AND ANAEROBIC 5 CC EACH   Final   Culture  Setup Time     Final   Value: 10/04/2013 12:48     Performed at [facility]   Culture     Final   Value:        BLOOD CULTURE RECEIVED NO GROWTH TO DATE CULTURE WILL BE HELD FOR 5 DAYS BEFORE ISSUING A FINAL NEGATIVE REPORT     Performed at [facility]   Report Status PENDING   Incomplete  GC/CHLAMYDIA PROBE AMP     Status: None   Collection Time    10/04/13  3:35 PM      Result Value Ref Range Status   CT Probe RNA NEGATIVE  NEGATIVE Final   GC Probe RNA NEGATIVE  NEGATIVE Final   Comment: (NOTE)                                                                                                **Normal Reference Range: Negative**          Assay performed using the Gen-Probe APTIMA COMBO2 (R) Assay.     Acceptable specimen types for this assay include APTIMA Swabs (Unisex,     endocervical, urethral, or vaginal), first void urine, and ThinPrep     liquid based cytology samples.     Performed at [place]     Status: None   Collection Time    10/04/13  3:39 PM      Result Value Ref Range Status   Streptococcus, Group A Screen (Direct) NEGATIVE  NEGATIVE Final   Comment: (NOTE)     A Rapid Antigen test may result negative if the antigen level in the     sample is below the detection level of this test. The FDA has not     cleared this test as a stand-alone test therefore the rapid antigen     negative result has reflexed to a Group A Strep culture.    Studies/Results: Dg Neck Soft Tissue  10/04/2013   CLINICAL DATA:  Neck pain fever  EXAM: NECK SOFT TISSUES - 1+ VIEW  COMPARISON:  None.  FINDINGS: There is no evidence of retropharyngeal soft tissue swelling or epiglottic enlargement. The cervical airway is unremarkable and no radio-opaque foreign body identified.  IMPRESSION: Negative.   Electronically Signed   By: Margaree Mackintosh M.D.   On: 10/04/2013 10:30   Dg Chest 2 View  10/04/2013   CLINICAL DATA:  Rash and cough.  EXAM: CHEST - 2 VIEW  COMPARISON:  None  FINDINGS: The  heart size and mediastinal contours are within normal limits. Mild pulmonary interstitial prominence and scarring bilaterally is likely consistent with chronic disease. There is no evidence of pulmonary edema, consolidation, pneumothorax, nodule or pleural fluid. The visualized skeletal structures are unremarkable.  IMPRESSION: No active disease.  Probable mild chronic lung disease.   Electronically Signed   By: Aletta Edouard M.D.   On: 10/04/2013 10:25      Assessment/Plan:  Principal Problem:    Rash Active Problems:   Dehydration   Protein-calorie malnutrition, severe   Hyponatremia   Hypokalemia    Lawrence Jennings is a 58 y.o. male with male previously healthy but now with onset of rash on face (that preceded by sinus congestion and ingestion of 4 different OTC meds) brief improvement off meds then with progression on face and emergence of rash on palms, penis, scrotum back, legs. Parts of the rash have exfoliative component. He has exudate in posterior oropharynx. RPR negative, HIV, Hep panel negative. GC probe urine negative. GAS probe OP negative. ASO negative. ESR 95. CRP is 8.5.  Tox screen negative.  #1 Fever and rash:  Differential diagnosis is fairly broad.  The more I look at the rash today the more Psoriaform it appears, could it have been triggered by antihistamines from OTC several weeks ago.   Could this be a Steven's Johnson's type rash post Mycoplasma infection, does not seem to be progressing that way.   Could have have post streptococcal Scarlet fever rash. He doesn't have many other features c/w Scarlet fever.   ASO were negative and GAS from throat negative  Certainly one could worry about other viruses that can present this way including Coxsackie virus infection.   This could certainly be primary HIV and Syphilis also on the table--was prozone checked   Behcet's disease and other AI phenemona need consideration including Levimasole toxicity from impure cocaine (but urine tox screen negative)  --check ANA, RF, ssa, ssb --followup Cold agglutinins, Mycoplasma abs, Coxsackie virus antibody panel   I will try again to see  If I can find a dermatologist who can access Epic and review case with them.  I wold vote to give pt systemic prednisone at 75m a day to see if he responds to this. I DONT think he has a bacterial infection driving this at all.  I spent greater than 40 minutes with the patient including greater than 50% of time in face to face counsel  of the patient and in coordination of their care.     LOS: 1 day   CTruman Hayward6/07/2013, 3:22 PM

## 2013-10-05 NOTE — Progress Notes (Signed)
On-call notified of AM temp (100.6). Blood and urine cultures drawn 6/2-results pending. No new orders.

## 2013-10-05 NOTE — Care Management Note (Addendum)
  Page 1 of 1   10/05/2013     8:29:35 AM CARE MANAGEMENT NOTE 10/05/2013  Patient:  Lawrence Jennings, Lawrence Jennings   Account Number:  [id number]  Date Initiated:  10/05/2013  Documentation initiated by:  [facility]  Subjective/Objective Assessment:   adm: Skin rash, poor oral intake and weakness.     Action/Plan:   discharge planning   Anticipated DC Date:  10/07/2013   Anticipated DC Plan:  [place]  CM consult  [facility]      Choice offered to / List presented to:             Status of service:  In process, will continue to follow Medicare Important Message given?   (If response is "NO", the following Medicare IM given date fields will be blank) Date Medicare IM given:   Date Additional Medicare IM given:    Discharge Disposition:    Per UR Regulation:    If discussed at Long Length of Stay Meetings, dates discussed:    Comments:  10/05/13 08:00 CM met with pt in room to discuss medication concerns.  Pt lives with sister and niece, is unemployed, no insurance, no PCP.  CM gave pt [place] pamphlet and explained CM will make an appt for him today to follow up at the [facility] for an orange card, PCP, and medicaid/insurance.  Pt verbalized understanding once an appt is secured, he can take his prescriptions to the [facility] to be filled directly after discharge.  Pt states he has transportation.  If pt discharges on the weekend, pt will need [place] letter for presriptions.  Will continue to follow for discharge needs.  Mariane Masters, BSN, CM [phone].

## 2013-10-05 NOTE — Telephone Encounter (Signed)
Nsg Mgr contacted office about setting up an Appointment  to [facility].Pt is in hospital@ this time Pt does not have his personal Ph with him at this time. Nsg Mgr Stated when ever office can reach out to Pt. is fine.

## 2013-10-05 NOTE — Progress Notes (Addendum)
Patient ID: Lawrence Jennings, male   DOB: 11/23/1955, 58 y.o.   MRN: [id number] [facility] PROGRESS NOTE  Lawrence Jennings OBS:[id number] DOB: 1955/11/03 DOA: 10/04/2013 PCP: No primary provider on file.  Brief narrative: 58 y.o. male with no known past medical history who presented to [facility] ED 10/04/2013 with widespread rash started on his face shortly after taking  over-the-counter medications for sinusitis. Rash initially spontaneously resolved but then came back with generalized spread. No associated pruritus or pain. No sick contacts and no reports of history of insect bites or recent travel history. Pt reproted he is bisexual but has been sexually inactive for many years. In the ED, patient had low-grade temperature of 80F, mild tachycardia in the low 100s, blood work was unremarkable except for mild hyponatremia. Chest and neck x-ray were without acute findings. HIV screen negative. RPR nonreactive. UA without features of UTI. Lactate was mildly elevated at 2.25. Infectious disease was consulted for further input on management.  Assessment/Plan:  Principal Problem:   Generalized rash and fever  Widespread rash including scalp, face, trunk, back, palms, feet macular with confluent areas, scaling, exfolating lesion   Differential diagnosis is very broad and includes but is not limited to HIV, syphilis, post streptococcal Scarlet fever rash, other viruses including Coxsackie virus infection, ?but less likely measles  As mentioned HIV screen is negative, RPR is nonreactive, antistreptolysin O titer is within normal limits, rapid strep screen is negative, acute hepatitis panel negative/nonreactive. The rest of the blood work is pending including gonococcus culture, HIV RNA, mycoplasma pneumonia antibody, coxsackie a virus, coxsackie B virus, anti-DNAse B antibody, cold agglutinin titer  Sedimentation rate was elevated at 95 and CRP was high at 8.5  Appreciate infectious disease following and  their recommendations. Patient is currently not on antibiotics.  Active problems: Hyponatremia  Resolved with IV fluids Hypokalemia  Potassium is 3.4 this morning and we repleted with 40 mEq by mouth potassium   Severe protein calorie malnutrition   In the context of acute illness as evidenced by less than 50% of by mouth intake for more than 5 days, severe muscle wasting and subcutaneous fat loss  Nutrition was consulted with recommendation to start him sure supplement twice a day   DVT prophylaxis: Lovenox subQ  Code Status: full code  Family Communication: plan of care discussed with the patient Disposition Plan: home when stable   Alison Murray, MD  [facility] Pager [phone]  If 7PM-7AM, please contact night-coverage [email or web address] Password [email or web address] 10/05/2013, 10:51 AM   LOS: 1 day   Consultants:  Infectious disease (Dr. Gwen [place])  Procedures:  None   Antibiotics:  None   HPI/Subjective: No acute overnight events.  Objective: Filed Vitals:   10/04/13 1300 10/04/13 1345 10/04/13 2055 10/05/13 0522  BP: 112/73 112/71 118/76 106/77  Pulse: 103 95 107 106  Temp:  98.8 F (37.1 C) 100.1 F (37.8 C) 100.6 F (38.1 C)  TempSrc:  Oral Oral Oral  Resp: 18 16 18 18   Height:  5\' 11"  (1.803 m)    Weight:  72.576 kg (160 lb)  73.71 kg (162 lb 8 oz)  SpO2: 97% 98% 100% 100%    Intake/Output Summary (Last 24 hours) at 10/05/13 1051 Last data filed at 10/05/13 0525  Gross per 24 hour  Intake    240 ml  Output    625 ml  Net   -385 ml    Exam:   General:  Pt is alert, follows commands appropriately,  not in acute distress  Cardiovascular: Regular rate and rhythm, S1/S2, no murmurs  Respiratory: Clear to auscultation bilaterally, no wheezing, no crackles, no rhonchi  Abdomen: Soft, non tender, non distended, bowel sounds present  Skin: widespread rash including scalp, face, trunk, back, palms, feet macular with confluent areas, scaling,  exfolating lesion   Extremities: No edema, pulses DP and PT palpable bilaterally  Neuro: Grossly nonfocal  Data Reviewed: Basic Metabolic Panel:  Recent Labs Lab 10/04/13 1005 10/05/13 0317  NA 133* 139  K 3.9 3.4*  CL 94* 105  CO2 24 24  GLUCOSE 114* 125*  BUN 14 13  CREATININE 0.83 0.67  CALCIUM 8.8 7.6*   Liver Function Tests:  Recent Labs Lab 10/04/13 1005  AST 109*  ALT 50  ALKPHOS 79  BILITOT 0.8  PROT 7.9  ALBUMIN 2.2*   No results found for this basename: LIPASE, AMYLASE,  in the last 168 hours No results found for this basename: AMMONIA,  in the last 168 hours CBC:  Recent Labs Lab 10/04/13 1005 10/05/13 0317  WBC 6.6 4.1  NEUTROABS 5.0  --   HGB 13.7 11.0*  HCT 39.7 32.1*  MCV 82.2 83.6  PLT 256 207   Cardiac Enzymes: No results found for this basename: CKTOTAL, CKMB, CKMBINDEX, TROPONINI,  in the last 168 hours BNP: No components found with this basename: POCBNP,  CBG:  Recent Labs Lab 10/04/13 0929 10/04/13 0952  GLUCAP 120* 118*    CULTURE, BLOOD (ROUTINE X 2)     Status: None   Collection Time    10/04/13 10:01 AM      Result Value Ref Range Status   Specimen Description BLOOD LEFT FOREARM   Final   Value:        BLOOD CULTURE RECEIVED NO GROWTH TO DATE CULTURE WILL BE HELD FOR 5 DAYS BEFORE ISSUING A FINAL NEGATIVE REPORT     Performed at [facility]   Report Status PENDING   Incomplete  CULTURE, BLOOD (ROUTINE X 2)     Status: None   Collection Time    10/04/13 10:01 AM      Result Value Ref Range Status   Specimen Description BLOOD LEFT ARM   Final   Value:        BLOOD CULTURE RECEIVED NO GROWTH TO DATE CULTURE WILL BE HELD FOR 5 DAYS BEFORE ISSUING A FINAL NEGATIVE REPORT     Performed at [facility]   Report Status PENDING   Incomplete  GC/CHLAMYDIA PROBE AMP     Status: None   Collection Time    10/04/13  3:35 PM      Result Value Ref Range Status   CT Probe RNA NEGATIVE  NEGATIVE Final   GC  Probe RNA NEGATIVE  NEGATIVE Final  RAPID STREP SCREEN     Status: None   Collection Time    10/04/13  3:39 PM      Result Value Ref Range Status   Streptococcus, Group A Screen (Direct) NEGATIVE  NEGATIVE Final     Studies: Dg Neck Soft Tissue 10/04/2013     IMPRESSION: Negative.     Dg Chest 2 View 10/04/2013    IMPRESSION: No active disease.  Probable mild chronic lung disease.      Scheduled Meds: . feeding supplement (ENSURE COMPLETE)  237 mL Oral BID BM   Continuous Infusions: . sodium chloride 125 mL/hr at 10/05/13 1014

## 2013-10-06 DIAGNOSIS — E871 Hypo-osmolality and hyponatremia: Secondary | ICD-10-CM

## 2013-10-06 DIAGNOSIS — E876 Hypokalemia: Secondary | ICD-10-CM

## 2013-10-06 LAB — ANA: ANA: POSITIVE — AB

## 2013-10-06 LAB — GLUCOSE, CAPILLARY: Glucose-Capillary: 224 mg/dL — ABNORMAL HIGH (ref 70–99)

## 2013-10-06 LAB — ANTI-NUCLEAR AB-TITER (ANA TITER): ANA Titer 1: 1:40 {titer} — ABNORMAL HIGH

## 2013-10-06 LAB — MYCOPLASMA PNEUMONIAE ANTIBODY, IGM: MYCOPLASMA PNEUMO IGM: 63 U/mL (ref <770)

## 2013-10-06 MED ORDER — PREDNISONE 20 MG PO TABS: 60.0000 mg | ORAL_TABLET | Freq: Every day | ORAL | Status: DC

## 2013-10-06 MED ORDER — PREDNISONE 5 MG PO TABS: 5.0000 mg | ORAL_TABLET | Freq: Every day | ORAL | Status: DC

## 2013-10-06 MED ORDER — DIPHENHYDRAMINE HCL 50 MG PO CAPS
50.0000 mg | ORAL_CAPSULE | Freq: Four times a day (QID) | ORAL | Status: DC | PRN
  Administered 2013-10-06: 50 mg via ORAL
  Filled 2013-10-06: qty 1

## 2013-10-06 NOTE — Progress Notes (Signed)
[facility]    Subjective: Joints hurt much less   Antibiotics:  Anti-infectives   None      Medications: Scheduled Meds: . enoxaparin (LOVENOX) injection  40 mg Subcutaneous Q24H  . feeding supplement (ENSURE COMPLETE)  237 mL Oral BID BM  . predniSONE  60 mg Oral Daily   Continuous Infusions:   PRN Meds:.acetaminophen, acetaminophen, albuterol, diphenhydrAMINE, ondansetron (ZOFRAN) IV, ondansetron, simethicone    Objective: Weight change:   Intake/Output Summary (Last 24 hours) at 10/06/13 1412 Last data filed at 10/06/13 0945  Gross per 24 hour  Intake   1080 ml  Output   2800 ml  Net  -1720 ml   Blood pressure 139/84, pulse 77, temperature 97.6 F (36.4 C), temperature source Oral, resp. rate 16, height '5\' 11"'  (1.803 m), weight 162 lb 8 oz (73.71 kg), SpO2 99.00%. Temp:  [97.6 F (36.4 C)-99.7 F (37.6 C)] 97.6 F (36.4 C) (06/04 0542) Pulse Rate:  [77-103] 77 (06/04 0542) Resp:  [16] 16 (06/04 0542) BP: (115-139)/(69-84) 139/84 mmHg (06/04 0542) SpO2:  [99 %] 99 % (06/04 0542)  Physical Exam: General: Alert and awake, oriented x3, not in any acute distress. HEENT: anicteric sclera, pupils reactive to light and accommodation, EOMI CVS regular rate, normal r,  no murmur rubs or gallops Chest: clear to auscultation bilaterally, no wheezing, rales or rhonchi Abdomen: soft nontender, nondistended, normal bowel sounds, Extremities: no  clubbing or edema noted bilaterally Skin:   Face Today:         Hands today:      Penis and scrotum today             Left leg today:            Today        Neuro: nonfocal  CBC:  Recent Labs Lab 10/04/13 1005 10/05/13 0317  HGB 13.7 11.0*  HCT 39.7 32.1*  PLT 256 207     BMET  Recent Labs  10/04/13 1005 10/05/13 0317  NA 133* 139  K 3.9 3.4*  CL 94* 105  CO2 24 24  GLUCOSE 114* 125*  BUN 14 13  CREATININE 0.83 0.67    CALCIUM 8.8 7.6*     Liver Panel   Recent Labs  10/04/13 1005  PROT 7.9  ALBUMIN 2.2*  AST 109*  ALT 50  ALKPHOS 79  BILITOT 0.8       Sedimentation Rate  Recent Labs  10/04/13 1512  ESRSEDRATE 95*   C-Reactive Protein  Recent Labs  10/04/13 1512  CRP 8.5*    Micro Results: Recent Results (from the past 240 hour(s))  CULTURE, BLOOD (ROUTINE X 2)     Status: None   Collection Time    10/04/13 10:01 AM      Result Value Ref Range Status   Specimen Description BLOOD LEFT FOREARM   Final   Special Requests BOTTLES DRAWN AEROBIC AND ANAEROBIC 2 CC   Final   Culture  Setup Time     Final   Value: 10/04/2013 12:48     Performed at [facility]   Culture     Final   Value:        BLOOD CULTURE RECEIVED NO GROWTH TO DATE CULTURE WILL BE HELD FOR 5 DAYS BEFORE ISSUING A FINAL NEGATIVE REPORT     Performed at [facility]   Report Status PENDING   Incomplete  CULTURE, BLOOD (ROUTINE X 2)  Status: None   Collection Time    10/04/13 10:01 AM      Result Value Ref Range Status   Specimen Description BLOOD LEFT ARM   Final   Special Requests BOTTLES DRAWN AEROBIC AND ANAEROBIC 5 CC EACH   Final   Culture  Setup Time     Final   Value: 10/04/2013 12:48     Performed at [facility]   Culture     Final   Value:        BLOOD CULTURE RECEIVED NO GROWTH TO DATE CULTURE WILL BE HELD FOR 5 DAYS BEFORE ISSUING A FINAL NEGATIVE REPORT     Performed at [facility]   Report Status PENDING   Incomplete  URINE CULTURE     Status: None   Collection Time    10/04/13 10:58 AM      Result Value Ref Range Status   Specimen Description URINE, CATHETERIZED   Final   Special Requests NONE   Final   Culture  Setup Time     Final   Value: 10/05/2013 02:59     Performed at [place] PENDING   Incomplete   Culture     Final   Value: Culture reincubated for better growth     Performed at [facility]   Report  Status PENDING   Incomplete  GC/CHLAMYDIA PROBE AMP     Status: None   Collection Time    10/04/13  3:35 PM      Result Value Ref Range Status   CT Probe RNA NEGATIVE  NEGATIVE Final   GC Probe RNA NEGATIVE  NEGATIVE Final   Comment: (NOTE)                                                                                               **Normal Reference Range: Negative**          Assay performed using the Gen-Probe APTIMA COMBO2 (R) Assay.     Acceptable specimen types for this assay include APTIMA Swabs (Unisex,     endocervical, urethral, or vaginal), first void urine, and ThinPrep     liquid based cytology samples.     Performed at [facility]     Status: None   Collection Time    10/04/13  3:39 PM      Result Value Ref Range Status   Streptococcus, Group A Screen (Direct) NEGATIVE  NEGATIVE Final   Comment: (NOTE)     A Rapid Antigen test may result negative if the antigen level in the     sample is below the detection level of this test. The FDA has not     cleared this test as a stand-alone test therefore the rapid antigen     negative result has reflexed to a Group A Strep culture.  GONOCOCCUS CULTURE     Status: None   Collection Time    10/04/13  3:40 PM      Result Value Ref Range Status   Specimen Description THROAT   Final  Special Requests NONE   Final   Culture     Final   Value: Culture reincubated for better growth     Performed at [place] PENDING   Incomplete    Studies/Results: No results found.    Assessment/Plan:  Principal Problem:   Rash Active Problems:   Dehydration   Protein-calorie malnutrition, severe   Hyponatremia   Hypokalemia    Lawrence Jennings is a 58 y.o. male with male previously healthy but now with onset of rash on face (that preceded by sinus congestion and ingestion of 4 different OTC meds) brief improvement off meds then with progression on face and emergence of rash  on palms, penis, scrotum back, legs. Parts of the rash have exfoliative component. He has exudate in posterior oropharynx. RPR negative, HIV, Hep panel negative. HIV RNA negative.  GC probe urine negative. GC culture pending. GAS probe OP negative. ASO negative. ESR 95. CRP is 8.5.  Tox screen negative. ANA + RF negative  #1 Fever and rash:   Differential diagnosis is fairly broad.  The more I look at the rashthe more Psoriaform it appears, could it have been triggered by antihistamines from OTC several weeks ago? Along with the arthritis suggestion of Psoriatic arthritis is higher on list Behcet's disease, stills disease possible  I dont think this is Steven's Johnson's type rash post Mycoplasma infection,.   Could have have post streptococcal Scarlet fever rash. He doesn't have many other features c/w Scarlet fever.   ASO were negative and GAS from throat negative  -followup Cold agglutinins, Mycoplasma abs, Coxsackie virus antibody panel   I discussed this case with Dr. Denna Haggard late last night. He thought SJ unlikely his vote was to hold off on steroids until infection disproved but I had already started them and I dont find any convincing evidence for infection  Would finish 14 day course of high dose prednisone 59m daily  OK to dc  He needs fu with IM, u[place](ID) and Derm   LOS: 2 days   CTruman Hayward6/08/2013, 2:12 PM

## 2013-10-06 NOTE — Progress Notes (Signed)
Pt was lying in bed and awake when I arrived. He recognized me as a friend/ acquaintance from our faith community. Pt was very concerned about the rash that he says he has been dealing with for 3 weeks. He described how he has been week and has lost over 30lbs. Pt said he didn't feel like eating and when he could get strength he would pour and drink water. Pt said he finally got to the point that he couldn't take it anymore and called for a friend to come to hospital. Pt enjoyed talking about family and church during our visit.  Pt kept referring to his face as being ugly and wants to shave but has been advised by staff to avoid shaving. I stressed the importance of following medical advice. Pt was very appreciative of visit and prayer. Marjory Lies Chaplain  10/06/13 1100  Clinical Encounter Type  Visited With Patient

## 2013-10-06 NOTE — Discharge Summary (Signed)
Physician Discharge Summary  Lawrence Jennings LXB:[id number] DOB: 16-Mar-1956 DOA: 10/04/2013  PCP: No primary provider on file.  Admit date: 10/04/2013 Discharge date: 10/06/2013  Recommendations for Outpatient Follow-up:  Start prednisone 60 mg daily for total of 2 weeks. You should be done 10/20/2013. On 10/21/2013 start prednisone taper.Taper down prednisone starting from 50 mg a day, taper down by 5 mg a day down to 0 mg. for ex, today 50 mg, tomorrow 45 mg, then 40 mg the following day and etc... Follow up in [facility] clinic for follow up appt per scheduled appt. We will follow up on the rest of the blood work which is pending  Discharge Diagnoses:  Principal Problem:   Rash Active Problems:   Dehydration   Protein-calorie malnutrition, severe   Hyponatremia   Hypokalemia    Discharge Condition: stable   Diet recommendation: as tolerated   History of present illness:  Lawrence Jennings with no known past medical history who presented to [facility] ED 10/04/2013 with widespread rash started on his face shortly after taking over-the-counter medications for sinusitis. Rash initially spontaneously resolved but then came back with generalized spread. No associated pruritus or pain. No sick contacts and no reports of history of insect bites or recent travel history. Pt reproted he is bisexual but has been sexually inactive for many years. In the ED, patient had low-grade temperature of 8F, mild tachycardia in the low 100s, blood work was unremarkable except for mild hyponatremia. Chest and neck x-ray were without acute findings. HIV screen negative. RPR nonreactive. UA without features of UTI. Lactate was mildly elevated at 2.25. Infectious disease was consulted for further input on management.   Assessment/Plan:   Principal Problem:  Generalized rash and fever  Widespread rash including scalp, face, trunk, back, palms, feet macular with confluent areas, scaling, exfolating lesion   Differential diagnosis is very broad and includes but is not limited to HIV, syphilis, post streptococcal Scarlet fever rash, other viruses including Coxsackie virus infection, ?but less likely measles  As mentioned HIV screen is negative, RPR is nonreactive, antistreptolysin O titer is within normal limits, rapid strep screen is negative, acute hepatitis panel negative/nonreactive. The rest of the blood work is pending including gonococcus culture (reincubated for better growth), HIV RNA (negative), mycoplasma pneumonia antibody, coxsackie a virus, coxsackie B virus, anti-DNAse B antibody, cold agglutinin titer  ANA titer positive but anti DS ab pending; he will see [place] in the clinic and pending the results and him obtaining the orange card will provide necessary referral which includes dermatology referral as well. Sedimentation rate was elevated at 95 and CRP was high at 8.5  Appreciate infectious disease following and their recommendations. Patient is currently not on antibiotics. Active problems:  Hyponatremia  Resolved to normal limits with IV fluids  Hypokalemia  Potassium epleted with 40 mEq by mouth potassium  Severe protein calorie malnutrition  In the context of acute illness as evidenced by less than 50% of by mouth intake for more than 5 days, severe muscle wasting and subcutaneous fat loss  Nutrition was consulted with recommendation to start him sure supplement twice a day   DVT prophylaxis: Lovenox subQ    Code Status: full code  Family Communication: plan of care discussed with the patient   Consultants:  Infectious disease (Dr. Gwen Her Dam) Procedures:  None  Antibiotics:  None   Signed:  Alison Murray, MD  [facility] 10/06/2013, 4:21 PM  Pager #: [phone]  Discharge Exam: Filed Vitals:   10/06/13 1449  BP: 130/77  Pulse: 87  Temp: 98.6 F (37 C)  Resp: 18   Filed Vitals:   10/05/13 1456 10/05/13 2140 10/06/13 0542 10/06/13 1449  BP:  134/82 115/69 139/84 130/77  Pulse: 100 103 77 87  Temp: 99.3 F (37.4 C) 99.7 F (37.6 C) 97.6 F (36.4 C) 98.6 F (37 C)  TempSrc: Oral Oral Oral Oral  Resp: 16 16 16 18   Height:      Weight:      SpO2: 99% 99% 99% 98%    Exam:  General: Pt is alert, not in acute distress  Cardiovascular: Regular rate and rhythm, S1/S2 appreciated  Respiratory: Clear to auscultation bilaterally, no wheezing Abdomen: Soft, non tender, non distended, bowel sounds present  Skin: maybe slightly better widespread rash including scalp, face, trunk, back, palms, feet macular with confluent areas, scaling, exfolating lesion  Extremities: No edema, pulses DP and PT palpable bilaterally  Neuro: Grossly nonfocal  Discharge Instructions  Discharge Instructions   Call MD for:  difficulty breathing, headache or visual disturbances    Complete by:  As directed      Call MD for:  persistant dizziness or light-headedness    Complete by:  As directed      Call MD for:  persistant nausea and vomiting    Complete by:  As directed      Call MD for:  severe uncontrolled pain    Complete by:  As directed      Diet - low sodium heart healthy    Complete by:  As directed      Discharge instructions    Complete by:  As directed   Start prednisone 60 mg daily for total of 2 weeks. You should be done 10/20/2013. On 10/21/2013 start prednisone taper.Taper down prednisone starting from 50 mg a day, taper down by 5 mg a day down to 0 mg. for ex, today 50 mg, tomorrow 45 mg, then 40 mg the following day and etc... Follow up in community health and wellness clinic for follow up appt per scheduled appt.     Increase activity slowly    Complete by:  As directed             Medication List         predniSONE 20 MG tablet  Commonly known as:  DELTASONE  Take 3 tablets (60 mg total) by mouth daily with breakfast.     predniSONE 5 MG tablet  Commonly known as:  DELTASONE  Take 1 tablet (5 mg total) by mouth daily  with breakfast.  Start taking on:  10/21/2013           Follow-up Information   Follow up with Dr. 10/23/2013. (Dr. Marthann Schiller office will call you for a follow up appt. She will be your primary care physician.)    Contact information:   Dr. [facility] [address] [place]  [phone]       Follow up with [facility]     On 10/19/2013. (at 10:15 am)    Contact information:   [address] [place] [phone]       The results of significant diagnostics from this hospitalization (including imaging, microbiology, ancillary and laboratory) are listed below for reference.    Significant Diagnostic Studies: Dg Neck Soft Tissue  10/04/2013   CLINICAL DATA:  Neck pain fever  EXAM: NECK SOFT TISSUES - 1+ VIEW  COMPARISON:  None.  FINDINGS: There is no evidence of retropharyngeal soft tissue swelling or epiglottic enlargement. The cervical airway is unremarkable and no radio-opaque foreign body identified.  IMPRESSION: Negative.   Electronically Signed   By: Salome Holmes M.D.   On: 10/04/2013 10:30   Dg Chest 2 View  10/04/2013   CLINICAL DATA:  Rash and cough.  EXAM: CHEST - 2 VIEW  COMPARISON:  None  FINDINGS: The heart size and mediastinal contours are within normal limits. Mild pulmonary interstitial prominence and scarring bilaterally is likely consistent with chronic disease. There is no evidence of pulmonary edema, consolidation, pneumothorax, nodule or pleural fluid. The visualized skeletal structures are unremarkable.  IMPRESSION: No active disease.  Probable mild chronic lung disease.   Electronically Signed   By: Irish Lack M.D.   On: 10/04/2013 10:25    Microbiology: Recent Results (from the past 240 hour(s))  CULTURE, BLOOD (ROUTINE X 2)     Status: None   Collection Time    10/04/13 10:01 AM      Result Value Ref Range Status   Specimen Description BLOOD LEFT FOREARM    Final   Special Requests BOTTLES DRAWN AEROBIC AND ANAEROBIC 2 CC   Final   Culture  Setup Time     Final   Value: 10/04/2013 12:48     Performed at [facility]   Culture     Final   Value:        BLOOD CULTURE RECEIVED NO GROWTH TO DATE CULTURE WILL BE HELD FOR 5 DAYS BEFORE ISSUING A FINAL NEGATIVE REPORT     Performed at [facility]   Report Status PENDING   Incomplete  CULTURE, BLOOD (ROUTINE X 2)     Status: None   Collection Time    10/04/13 10:01 AM      Result Value Ref Range Status   Specimen Description BLOOD LEFT ARM   Final   Special Requests BOTTLES DRAWN AEROBIC AND ANAEROBIC 5 CC EACH   Final   Culture  Setup Time     Final   Value: 10/04/2013 12:48     Performed at [facility]   Culture     Final   Value:        BLOOD CULTURE RECEIVED NO GROWTH TO DATE CULTURE WILL BE HELD FOR 5 DAYS BEFORE ISSUING A FINAL NEGATIVE REPORT     Performed at [facility]   Report Status PENDING   Incomplete  URINE CULTURE     Status: None   Collection Time    10/04/13 10:58 AM      Result Value Ref Range Status   Specimen Description URINE, CATHETERIZED   Final   Special Requests NONE   Final   Culture  Setup Time     Final   Value: 10/05/2013 02:59     Performed at [facility] Count PENDING   Incomplete   Culture     Final   Value: Culture reincubated for better growth     Performed at [facility]   Report Status PENDING   Incomplete  GC/CHLAMYDIA PROBE AMP     Status: None   Collection Time    10/04/13  3:35 PM      Result Value Ref Range Status   CT Probe RNA NEGATIVE  NEGATIVE Final   GC Probe RNA NEGATIVE  NEGATIVE Final   Comment: (NOTE)                                                                                               **Normal Reference Range: Negative**          Assay performed using the Gen-Probe APTIMA COMBO2 (R) Assay.     Acceptable specimen types for this assay include APTIMA Swabs (Unisex,      endocervical, urethral, or vaginal), first void urine, and ThinPrep     liquid based cytology samples.     Performed at [facility]  RAPID STREP SCREEN     Status: None   Collection Time    10/04/13  3:39 PM      Result Value Ref Range Status   Streptococcus, Group A Screen (Direct) NEGATIVE  NEGATIVE Final   Comment: (NOTE)     A Rapid Antigen test may result negative if the antigen level in the     sample is below the detection level of this test. The FDA has not     cleared this test as a stand-alone test therefore the rapid antigen     negative result has reflexed to a Group A Strep culture.  GONOCOCCUS CULTURE     Status: None   Collection Time    10/04/13  3:40 PM      Result Value Ref Range Status   Specimen Description THROAT   Final   Special Requests NONE   Final   Culture     Final   Value: Culture reincubated for better growth     Performed at [facility]   Report Status PENDING   Incomplete     Labs: Basic Metabolic Panel:  Recent Labs Lab 10/04/13 1005 10/05/13 0317  NA 133* 139  K 3.9 3.4*  CL 94* 105  CO2 24 24  GLUCOSE 114* 125*  BUN 14 13  CREATININE 0.83 0.67  CALCIUM 8.8 7.6*   Liver Function Tests:  Recent Labs Lab 10/04/13 1005  AST 109*  ALT 50  ALKPHOS 79  BILITOT 0.8  PROT 7.9  ALBUMIN 2.2*   No results found for this basename: LIPASE, AMYLASE,  in the last 168 hours No results found for this basename: AMMONIA,  in the last 168 hours CBC:  Recent Labs Lab 10/04/13 1005 10/05/13 0317  WBC 6.6 4.1  NEUTROABS 5.0  --   HGB 13.7 11.0*  HCT 39.7 32.1*  MCV 82.2 83.6  PLT 256 207   Cardiac Enzymes: No results found for this basename: CKTOTAL, CKMB, CKMBINDEX, TROPONINI,  in the last 168 hours BNP: BNP (last 3 results) No results found for this basename: PROBNP,  in the last 8760 hours CBG:  Recent Labs Lab 10/04/13 0929 10/04/13 0952 10/06/13 1456  GLUCAP 120* 118* 224*    Time coordinating  discharge: Over 30 minutes

## 2013-10-06 NOTE — Discharge Instructions (Signed)

## 2013-10-07 LAB — GONOCOCCUS CULTURE

## 2013-10-07 LAB — URINE CULTURE

## 2013-10-07 LAB — COXSACKIE B VIRUS ANTIBODIES
Coxsackie B1 Ab: <1:8 {titer}
Coxsackie B3 Ab: <1:8 {titer}
Coxsackie B4 Ab: <1:8 {titer}
Coxsackie B5 Ab: <1:8 {titer}
Coxsackie B6 Ab: 1:8 {titer} — ABNORMAL HIGH

## 2013-10-07 LAB — COLD AGGLUTININ TITER

## 2013-10-07 LAB — SJOGRENS SYNDROME-A EXTRACTABLE NUCLEAR ANTIBODY: SSA (Ro) (ENA) Antibody, IgG: 1.8

## 2013-10-07 LAB — SJOGRENS SYNDROME-B EXTRACTABLE NUCLEAR ANTIBODY: SSB (La) (ENA) Antibody, IgG: <1

## 2013-10-07 LAB — ANTI-DNASE B ANTIBODY: Anti-DNAse-B: 302 U/mL — ABNORMAL HIGH (ref <301)

## 2013-10-08 ENCOUNTER — Encounter (HOSPITAL_COMMUNITY): Payer: Self-pay | Admitting: Emergency Medicine

## 2013-10-08 ENCOUNTER — Inpatient Hospital Stay (HOSPITAL_COMMUNITY)
Admission: EM | Admit: 2013-10-08 | Discharge: 2013-10-11 | DRG: 193 | Disposition: A | Discharge status: Home / Self Care | Payer: Medicaid Other | Attending: Internal Medicine | Admitting: Internal Medicine

## 2013-10-08 ENCOUNTER — Other Ambulatory Visit (HOSPITAL_COMMUNITY): Payer: Self-pay

## 2013-10-08 ENCOUNTER — Emergency Department (HOSPITAL_COMMUNITY): Payer: Medicaid Other

## 2013-10-08 ENCOUNTER — Inpatient Hospital Stay (HOSPITAL_COMMUNITY): Payer: Medicaid Other

## 2013-10-08 DIAGNOSIS — R7989 Other specified abnormal findings of blood chemistry: Secondary | ICD-10-CM

## 2013-10-08 DIAGNOSIS — R809 Proteinuria, unspecified: Secondary | ICD-10-CM | POA: Diagnosis present

## 2013-10-08 DIAGNOSIS — E871 Hypo-osmolality and hyponatremia: Secondary | ICD-10-CM

## 2013-10-08 DIAGNOSIS — E781 Pure hyperglyceridemia: Secondary | ICD-10-CM | POA: Diagnosis present

## 2013-10-08 DIAGNOSIS — R739 Hyperglycemia, unspecified: Secondary | ICD-10-CM | POA: Diagnosis present

## 2013-10-08 DIAGNOSIS — IMO0002 Reserved for concepts with insufficient information to code with codable children: Secondary | ICD-10-CM

## 2013-10-08 DIAGNOSIS — J189 Pneumonia, unspecified organism: Principal | ICD-10-CM

## 2013-10-08 DIAGNOSIS — R5381 Other malaise: Secondary | ICD-10-CM | POA: Diagnosis present

## 2013-10-08 DIAGNOSIS — R5383 Other fatigue: Secondary | ICD-10-CM

## 2013-10-08 DIAGNOSIS — M083 Juvenile rheumatoid polyarthritis (seronegative): Secondary | ICD-10-CM | POA: Diagnosis present

## 2013-10-08 DIAGNOSIS — R079 Chest pain, unspecified: Secondary | ICD-10-CM

## 2013-10-08 DIAGNOSIS — R700 Elevated erythrocyte sedimentation rate: Secondary | ICD-10-CM | POA: Diagnosis present

## 2013-10-08 DIAGNOSIS — E43 Unspecified severe protein-calorie malnutrition: Secondary | ICD-10-CM

## 2013-10-08 DIAGNOSIS — I6350 Cerebral infarction due to unspecified occlusion or stenosis of unspecified cerebral artery: Secondary | ICD-10-CM

## 2013-10-08 DIAGNOSIS — R21 Rash and other nonspecific skin eruption: Secondary | ICD-10-CM

## 2013-10-08 DIAGNOSIS — R531 Weakness: Secondary | ICD-10-CM | POA: Diagnosis present

## 2013-10-08 DIAGNOSIS — T380X5A Adverse effect of glucocorticoids and synthetic analogues, initial encounter: Secondary | ICD-10-CM | POA: Diagnosis present

## 2013-10-08 DIAGNOSIS — F17200 Nicotine dependence, unspecified, uncomplicated: Secondary | ICD-10-CM | POA: Diagnosis present

## 2013-10-08 DIAGNOSIS — R918 Other nonspecific abnormal finding of lung field: Secondary | ICD-10-CM | POA: Diagnosis present

## 2013-10-08 DIAGNOSIS — R7309 Other abnormal glucose: Secondary | ICD-10-CM | POA: Diagnosis present

## 2013-10-08 DIAGNOSIS — E876 Hypokalemia: Secondary | ICD-10-CM

## 2013-10-08 DIAGNOSIS — M0820 Juvenile rheumatoid arthritis with systemic onset, unspecified site: Secondary | ICD-10-CM

## 2013-10-08 DIAGNOSIS — E860 Dehydration: Secondary | ICD-10-CM

## 2013-10-08 DIAGNOSIS — M6281 Muscle weakness (generalized): Secondary | ICD-10-CM | POA: Diagnosis present

## 2013-10-08 DIAGNOSIS — I639 Cerebral infarction, unspecified: Secondary | ICD-10-CM

## 2013-10-08 DIAGNOSIS — M130 Polyarthritis, unspecified: Secondary | ICD-10-CM

## 2013-10-08 DIAGNOSIS — R509 Fever, unspecified: Secondary | ICD-10-CM

## 2013-10-08 DIAGNOSIS — R9389 Abnormal findings on diagnostic imaging of other specified body structures: Secondary | ICD-10-CM | POA: Diagnosis present

## 2013-10-08 DIAGNOSIS — M1990 Unspecified osteoarthritis, unspecified site: Secondary | ICD-10-CM

## 2013-10-08 HISTORY — DX: Allergy, unspecified, initial encounter: T78.40XA

## 2013-10-08 HISTORY — DX: Dorsalgia, unspecified: M54.9

## 2013-10-08 HISTORY — DX: Unspecified disorder of nose and nasal sinuses: J34.9

## 2013-10-08 LAB — COMPREHENSIVE METABOLIC PANEL
ALT: 49 U/L (ref 0–53)
AST: 82 U/L — ABNORMAL HIGH (ref 0–37)
Albumin: 1.9 g/dL — ABNORMAL LOW (ref 3.5–5.2)
Alkaline Phosphatase: 73 U/L (ref 39–117)
BUN: 11 mg/dL (ref 6–23)
CALCIUM: 8.2 mg/dL — AB (ref 8.4–10.5)
CO2: 23 meq/L (ref 19–32)
CREATININE: 0.69 mg/dL (ref 0.50–1.35)
Chloride: 104 mEq/L (ref 96–112)
GLUCOSE: 116 mg/dL — AB (ref 70–99)
Potassium: 3.4 mEq/L — ABNORMAL LOW (ref 3.7–5.3)
Sodium: 140 mEq/L (ref 137–147)
TOTAL PROTEIN: 6.7 g/dL (ref 6.0–8.3)
Total Bilirubin: 0.4 mg/dL (ref 0.3–1.2)

## 2013-10-08 LAB — PROTIME-INR
INR: 0.91 (ref 0.00–1.49)
PROTHROMBIN TIME: 12.1 s (ref 11.6–15.2)

## 2013-10-08 LAB — TROPONIN I: Troponin I: <0.3 ng/mL (ref <0.30)

## 2013-10-08 LAB — CBC WITH DIFFERENTIAL/PLATELET
Basophils Absolute: 0 10*3/uL (ref 0.0–0.1)
Basophils Relative: 0 % (ref 0–1)
EOS PCT: 0 % (ref 0–5)
Eosinophils Absolute: 0 10*3/uL (ref 0.0–0.7)
HEMATOCRIT: 35.8 % — AB (ref 39.0–52.0)
Hemoglobin: 12.2 g/dL — ABNORMAL LOW (ref 13.0–17.0)
LYMPHS ABS: 0.7 10*3/uL (ref 0.7–4.0)
LYMPHS PCT: 11 % — AB (ref 12–46)
MCH: 28 pg (ref 26.0–34.0)
MCHC: 34.1 g/dL (ref 30.0–36.0)
MCV: 82.1 fL (ref 78.0–100.0)
MONO ABS: 0.9 10*3/uL (ref 0.1–1.0)
MONOS PCT: 13 % — AB (ref 3–12)
Neutro Abs: 5.1 10*3/uL (ref 1.7–7.7)
Neutrophils Relative %: 76 % (ref 43–77)
Platelets: 266 10*3/uL (ref 150–400)
RBC: 4.36 MIL/uL (ref 4.22–5.81)
RDW: 12.7 % (ref 11.5–15.5)
WBC: 6.7 10*3/uL (ref 4.0–10.5)

## 2013-10-08 LAB — URINALYSIS, ROUTINE W REFLEX MICROSCOPIC
Bilirubin Urine: NEGATIVE
Glucose, UA: NEGATIVE mg/dL
Ketones, ur: NEGATIVE mg/dL
LEUKOCYTES UA: NEGATIVE
Nitrite: NEGATIVE
PH: 6 (ref 5.0–8.0)
Protein, ur: >300 mg/dL — AB
SPECIFIC GRAVITY, URINE: 1.011 (ref 1.005–1.030)
Urobilinogen, UA: 4 mg/dL — ABNORMAL HIGH (ref 0.0–1.0)

## 2013-10-08 LAB — PRO B NATRIURETIC PEPTIDE: Pro B Natriuretic peptide (BNP): 206.1 pg/mL — ABNORMAL HIGH (ref 0–125)

## 2013-10-08 LAB — URINE MICROSCOPIC-ADD ON

## 2013-10-08 LAB — CK: Total CK: 98 U/L (ref 7–232)

## 2013-10-08 LAB — COXSACKIE A VIRUS ANTIBODIES

## 2013-10-08 MED ORDER — ONDANSETRON HCL 4 MG/2ML IJ SOLN
4.0000 mg | Freq: Once | INTRAMUSCULAR | Status: AC
  Administered 2013-10-08: 4 mg via INTRAVENOUS
  Filled 2013-10-08: qty 2

## 2013-10-08 MED ORDER — POTASSIUM CHLORIDE 20 MEQ/15ML (10%) PO LIQD
40.0000 meq | Freq: Once | ORAL | Status: AC
  Administered 2013-10-08: 40 meq via ORAL
  Filled 2013-10-08: qty 30

## 2013-10-08 MED ORDER — PREDNISONE 50 MG PO TABS
60.0000 mg | ORAL_TABLET | Freq: Every day | ORAL | Status: DC
  Administered 2013-10-08 – 2013-10-11 (×4): 60 mg via ORAL
  Filled 2013-10-08 (×5): qty 1

## 2013-10-08 MED ORDER — ENOXAPARIN SODIUM 40 MG/0.4ML ~~LOC~~ SOLN
40.0000 mg | SUBCUTANEOUS | Status: DC
  Administered 2013-10-08 – 2013-10-10 (×3): 40 mg via SUBCUTANEOUS
  Filled 2013-10-08 (×5): qty 0.4

## 2013-10-08 MED ORDER — OXYCODONE-ACETAMINOPHEN 5-325 MG PO TABS
1.0000 | ORAL_TABLET | ORAL | Status: DC | PRN
  Administered 2013-10-08 – 2013-10-09 (×3): 1 via ORAL
  Filled 2013-10-08 (×4): qty 1

## 2013-10-08 MED ORDER — VANCOMYCIN HCL 10 G IV SOLR
1250.0000 mg | INTRAVENOUS | Status: AC
  Administered 2013-10-08: 1250 mg via INTRAVENOUS
  Filled 2013-10-08: qty 1250

## 2013-10-08 MED ORDER — PIPERACILLIN-TAZOBACTAM 3.375 G IVPB 30 MIN
3.3750 g | INTRAVENOUS | Status: AC
  Administered 2013-10-08: 3.375 g via INTRAVENOUS
  Filled 2013-10-08: qty 50

## 2013-10-08 MED ORDER — SODIUM CHLORIDE 0.9 % IV BOLUS (SEPSIS)
1000.0000 mL | INTRAVENOUS | Status: AC
  Administered 2013-10-08: 1000 mL via INTRAVENOUS

## 2013-10-08 MED ORDER — PIPERACILLIN-TAZOBACTAM 3.375 G IVPB
3.3750 g | Freq: Three times a day (TID) | INTRAVENOUS | Status: DC
  Administered 2013-10-08 – 2013-10-10 (×6): 3.375 g via INTRAVENOUS
  Filled 2013-10-08 (×7): qty 50

## 2013-10-08 MED ORDER — VANCOMYCIN HCL IN DEXTROSE 1-5 GM/200ML-% IV SOLN
1000.0000 mg | Freq: Three times a day (TID) | INTRAVENOUS | Status: DC
  Administered 2013-10-08 – 2013-10-09 (×4): 1000 mg via INTRAVENOUS
  Filled 2013-10-08 (×6): qty 200

## 2013-10-08 MED ORDER — HYDROMORPHONE HCL PF 1 MG/ML IJ SOLN
0.5000 mg | Freq: Once | INTRAMUSCULAR | Status: AC
  Administered 2013-10-08: 0.5 mg via INTRAVENOUS
  Filled 2013-10-08: qty 1

## 2013-10-08 MED ORDER — SODIUM CHLORIDE 0.9 % IJ SOLN
3.0000 mL | Freq: Two times a day (BID) | INTRAMUSCULAR | Status: DC
  Administered 2013-10-08 – 2013-10-11 (×2): 3 mL via INTRAVENOUS

## 2013-10-08 MED ORDER — SODIUM CHLORIDE 0.9 % IV SOLN
INTRAVENOUS | Status: DC
Start: 2013-10-08 — End: 2013-10-09
  Administered 2013-10-08 (×2): via INTRAVENOUS

## 2013-10-08 NOTE — ED Notes (Signed)
Harrison MD at bedside assessing pt at present time.

## 2013-10-08 NOTE — ED Notes (Signed)
Patient reports that he has been sick in the hospital and has rash throughout body. The patient has weakness and rash unable to control

## 2013-10-08 NOTE — ED Notes (Signed)
Pt encouraged to void when able. 

## 2013-10-08 NOTE — ED Notes (Signed)
Pt encouraged to void

## 2013-10-08 NOTE — Progress Notes (Signed)
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 Pt offered medications for relaxation, but he refused and refused to have the test completed. MD made aware. Pt brought to the floor by NT from ED. RN had informed MRI tech that report had not yet been received from ED and that pt should return there, but she stated that pt was already in transit to the floor.

## 2013-10-08 NOTE — ED Notes (Signed)
Patient transported to CT 

## 2013-10-08 NOTE — ED Provider Notes (Signed)
CSN: [id number]     Arrival date & time 10/08/13  1150 History   First MD Initiated Contact with Patient 10/08/13 1212     Chief Complaint  Patient presents with  . Weakness     (Consider location/radiation/quality/duration/timing/severity/associated sxs/prior Treatment) Patient is a 58 y.o. male presenting with weakness.  Weakness This is a new problem. The current episode started more than 1 week ago. The problem occurs constantly. The problem has been rapidly worsening. Associated symptoms include chest pain and shortness of breath. Pertinent negatives include no abdominal pain and no headaches. Nothing aggravates the symptoms. Nothing relieves the symptoms. He has tried nothing for the symptoms. The treatment provided no relief.    Past Medical History  Diagnosis Date  . Allergic reaction   . Sinus disease   . Back pain    History reviewed. No pertinent past surgical history. History reviewed. No pertinent family history. History  Substance Use Topics  . Smoking status: Current Every Day Smoker -- 0.25 packs/day for 20 years    Types: Cigarettes  . Smokeless tobacco: Never Used  . Alcohol Use: No    Review of Systems  Constitutional: Positive for fatigue. Negative for fever.  HENT: Negative for drooling and rhinorrhea.   Eyes: Negative for pain.  Respiratory: Positive for cough and shortness of breath.   Cardiovascular: Positive for chest pain. Negative for leg swelling.  Gastrointestinal: Positive for nausea. Negative for vomiting, abdominal pain and diarrhea.  Genitourinary: Negative for dysuria and hematuria.  Musculoskeletal: Negative for gait problem and neck pain.  Skin: Positive for rash. Negative for color change.  Neurological: Positive for weakness. Negative for numbness and headaches.  Hematological: Negative for adenopathy.  Psychiatric/Behavioral: Negative for behavioral problems.  All other systems reviewed and are negative.     Allergies  Review of  patient's allergies indicates no known allergies.  Home Medications   Prior to Admission medications   Medication Sig Start Date End Date Taking? Authorizing Provider  predniSONE (DELTASONE) 20 MG tablet Take 3 tablets (60 mg total) by mouth daily with breakfast. 10/06/13 10/20/13  Alison Murray, MD  predniSONE (DELTASONE) 5 MG tablet Take 1 tablet (5 mg total) by mouth daily with breakfast. 10/21/13   Alison Murray, MD   BP 116/80  Pulse 115  Temp(Src) 99.1 F (37.3 C) (Oral)  Resp 16  SpO2 100% Physical Exam  Nursing note and vitals reviewed. Constitutional: He is oriented to person, place, and time. He appears well-developed and well-nourished.  HENT:  Head: Normocephalic and atraumatic.  Right Ear: External ear normal.  Left Ear: External ear normal.  Nose: Nose normal.  Mouth/Throat: Oropharynx is clear and moist. No oropharyngeal exudate.  Eyes: Conjunctivae and EOM are normal. Pupils are equal, round, and reactive to light.  Neck: Normal range of motion. Neck supple.  Cardiovascular: Normal rate, regular rhythm, normal heart sounds and intact distal pulses.  Exam reveals no gallop and no friction rub.   No murmur heard. Pulmonary/Chest: Effort normal and breath sounds normal. No respiratory distress. He has no wheezes.  Abdominal: Soft. Bowel sounds are normal. He exhibits no distension. There is no tenderness. There is no rebound and no guarding.  Musculoskeletal: Normal range of motion. He exhibits no edema and no tenderness.  Neurological: He is alert and oriented to person, place, and time.  Reflex Scores:      Patellar reflexes are 1+ on the right side and 1+ on the left side.  Achilles reflexes are 1+ on the right side and 1+ on the left side. alert, oriented x3 speech: normal in context and clarity memory: intact grossly cranial nerves II-XII: intact motor strength: full proximally and distally in UE's, 3/5 strength in bilateral LE's no involuntary movements or  tremors sensation: intact to light touch diffusely  cerebellar: finger-to-nose and heel-to-shin intact gait: unable to ambulate d/t weakness   Skin: Skin is warm and dry. Rash noted.  Excoriating lesions noted diffusely. Most prominent on the palms, face, elbows.  Psychiatric: He has a normal mood and affect. His behavior is normal.    ED Course  Procedures (including critical care time) Labs Review Labs Reviewed  CBC WITH DIFFERENTIAL - Abnormal; Notable for the following:    Hemoglobin 12.2 (*)    HCT 35.8 (*)    Lymphocytes Relative 11 (*)    Monocytes Relative 13 (*)    All other components within normal limits  COMPREHENSIVE METABOLIC PANEL - Abnormal; Notable for the following:    Potassium 3.4 (*)    Glucose, Bld 116 (*)    Calcium 8.2 (*)    Albumin 1.9 (*)    AST 82 (*)    All other components within normal limits  URINALYSIS, ROUTINE W REFLEX MICROSCOPIC - Abnormal; Notable for the following:    Hgb urine dipstick SMALL (*)    Protein, ur >300 (*)    Urobilinogen, UA 4.0 (*)    All other components within normal limits  PRO B NATRIURETIC PEPTIDE - Abnormal; Notable for the following:    Pro B Natriuretic peptide (BNP) 206.1 (*)    All other components within normal limits  URINE MICROSCOPIC-ADD ON - Abnormal; Notable for the following:    Bacteria, UA FEW (*)    All other components within normal limits  CULTURE, BLOOD (ROUTINE X 2)  CULTURE, BLOOD (ROUTINE X 2)  TROPONIN I  PROTIME-INR  CK  TROPONIN I  TROPONIN I  TROPONIN I    Imaging Review Dg Chest 2 View  10/08/2013   CLINICAL DATA:  Short of breath  EXAM: CHEST  2 VIEW  COMPARISON:  Prior chest x-ray 10/04/2013  FINDINGS: Stable cardiac and mediastinal contours. Trace atherosclerotic calcification in the transverse aorta. Patchy airspace opacities in the right lower lobe conspicuous only on the frontal view. Otherwise, the lungs are clear. Mild bronchitic changes and chronic lung disease unchanged.   IMPRESSION: 1. Patchy airspace opacities in the right lower lobe seen only in the frontal view may reflect very early infiltrate in the setting of bronchopneumonia, or perhaps subsegmental atelectasis.   Electronically Signed   By: Malachy Moan M.D.   On: 10/08/2013 13:29   Dg Wrist Complete Left  10/08/2013   CLINICAL DATA:  Left wrist pain and swelling.  EXAM: LEFT WRIST - COMPLETE 3+ VIEW  COMPARISON:  None.  FINDINGS: There is no evidence of fracture or dislocation. There is no evidence of arthropathy or other focal bone abnormality. Soft tissues are unremarkable.  IMPRESSION: Negative.   Electronically Signed   By: Myles Rosenthal M.D.   On: 10/08/2013 13:34   Ct Head Wo Contrast  10/08/2013   CLINICAL DATA:  Lower extremity weakness.  EXAM: CT HEAD WITHOUT CONTRAST  TECHNIQUE: Contiguous axial images were obtained from the base of the skull through the vertex without intravenous contrast.  COMPARISON:  No priors.  FINDINGS: Slightly ill-defined area of low attenuation in the anterior limb of the right internal capsule (images 17 and  18 of series 2) could represent age-indeterminate ischemia. No other acute intracranial abnormalities. Specifically, no evidence of mass, mass effect, hydrocephalus or abnormal intra or extra-axial fluid collections. No acute displaced skull fractures are identified. Mastoids are well pneumatized bilaterally. There is complete opacification of the right frontal sinus and near complete opacification of the maxillary sinuses bilaterally, with some mild multifocal mucosal thickening throughout the ethmoid sinuses bilaterally.  IMPRESSION: 1. Tiny ill-defined focus of low attenuation in the anterior limb of the right internal capsule could represent age-indeterminate (potentially subacute) ischemia. This could be better evaluated with MRI of the brain if clinically indicated. 2. Extensive paranasal sinus disease, as above. Given the mucoperiosteal thickening, of majority of this  is favored to represent chronic disease, however, clinical correlation for signs and symptoms of acute sinusitis is recommended. These results were called by telephone at the time of interpretation on 10/08/2013 at 1:35 PM to Dr. Randa Spike, Romeo Apple, who verbally acknowledged these results.   Electronically Signed   By: Trudie Reed M.D.   On: 10/08/2013 13:39     EKG Interpretation   Date/Time:  Saturday October 08 2013 12:17:18 EDT Ventricular Rate:  102 PR Interval:  131 QRS Duration: 76 QT Interval:  332 QTC Calculation: 432 R Axis:   69 Text Interpretation:  Sinus tachycardia Probable anteroseptal infarct, old  No significant change since last tracing Confirmed by Mack Alvidrez  MD,  Demarri Elie ([id number]) on 10/08/2013 12:52:42 PM      MDM   Final diagnoses:  CVA (cerebral infarction)  HCAP (healthcare-associated pneumonia)  Generalized weakness  Fatigue  Rash    12:57 PM 58 y.o. male status post recent discharge after being admitted for fever and rash who presents with worsening generalized weakness. He also notes worsening shortness of breath over the last few weeks. He complains of nausea but denies any vomiting. He denies any new fevers. He notes a few new lesions that the rash is otherwise been stable. He is complaining of left wrist pain but denies any injury. He is afebrile and mildly tachycardic here. Will get screening labwork and imaging.  Infiltrate noted on CXR. Will tx for HCAP. Tiny ill-defined focus of low attenuation in the anterior limb of the right internal capsule could represent age-indeterminate (potentially subacute) ischemia noted on CT. Will admit to hospitalist.      Junius Argyle, MD 10/08/13 [phone]

## 2013-10-08 NOTE — Progress Notes (Signed)
ANTIBIOTIC CONSULT NOTE - INITIAL  Pharmacy Consult for vancomycin and Zosyn Indication: pneumonia  No Known Allergies  Patient Measurements: Weight: 73.3kg IBW: 75.3kg  Vital Signs: Temp: 99.1 F (37.3 C) (06/06 1202) Temp src: Oral (06/06 1202) BP: 128/81 mmHg (06/06 1423) Pulse Rate: 96 (06/06 1423)   Medical History: Past Medical History  Diagnosis Date  . Allergic reaction   . Sinus disease   . Back pain     Medications:  Scheduled:  . enoxaparin (LOVENOX) injection  40 mg Subcutaneous Q24H  . sodium chloride  3 mL Intravenous Q12H   Infusions:  . sodium chloride    . piperacillin-tazobactam    . vancomycin     Assessment: 57 yoM admitted 6/6 with weakness, progressive SOB, and chest pain. Patient with recent admission 6/2-6/4 for disseminated rash and discharged on high-dose prednisone taper. Pharmacy is consulted to dose vancomycin and Zosyn for HCAP.  1st doses have been ordered to be given in the ED  Antiinfectives  6/6 >> vancomycin >> 6/6 >> Zosyn >>    Labs / vitals Tmax: 99.1 WBCs: WNL Renal: SCr 0.69 (baseline unknown), CrCl >100 CG/n using SCr=0.8  Microbiology 6/6 blood x2: collected  Goal of Therapy:  Vancomycin trough level 15-20 mcg/ml Zosyn per indication and renal function  Plan:  - start Zosyn 3.375G IV q8h, each dose to be infused over 4 hours at 2000 - agree with vancomycin 1250mg  IV x1 - start vancomycin 1g IV q8h at midnight - vancomycin trough at steady state if indicated - follow-up clinical course, culture results, renal function - follow-up antibiotic de-escalation and length of therapy  Thank you for the consult.  , PharmD, BCPS Pager: [phone] Pharmacy: [phone] 10/08/2013 3:24 PM

## 2013-10-08 NOTE — ED Notes (Addendum)
Pt reports generalized rash over body for 3 weeks. Pt reports worsening of SOB, hoariness, and left hand swelling since discharge Thursday. Pt reports CP 7/10. Pt reports CP for 3 weeks. Pt reports NO change in CP since onset.

## 2013-10-08 NOTE — ED Notes (Signed)
Off floor for testing-MRI-transport to floor when testing completed

## 2013-10-08 NOTE — ED Notes (Signed)
Put urinal beside pt and verbally let him know that we need a specimen as soon as possible.

## 2013-10-08 NOTE — H&P (Signed)
History and Physical    Lawrence Jennings WUJ:[id number] DOB: Dec 28, 1955 DOA: 10/08/2013  Referring physician: Dr. Romeo Apple PCP: No primary provider on file.  Specialists: ID, Dr. Algis Liming  Chief Complaint: weakness  HPI: Lawrence Jennings is a 58 y.o. male without much past medical history recently admitted to [facility] and discharged 2 days ago presents to the ED with weakness and severe bilateral hand pain for the past day. He initially presented on 6/2 with a rash. He self diagnosed with sinusitis about 3-4 weeks ago and took several over the counter medications and the next day he started seeing the rash, also combined with weakness, thought to be infectious in etiology, ID consulted and evaluated patient and underwent extensive workup without a clear defined etiology. Patient improved and was discharged home on high dose prednisone. He states that he took that at home but has been progressively weak, endorsing severe shortness of breath, cough, chest pain. He feels lightheaded; denies any focal weakness, just all over. In the ED, patient is afebrile, labs are somewhat unremarkable except for mild BNP elevation. Underwent CT head for his weakness which found ill-defined focus of low attenuation in the anterior limb of the right internal capsule and had a CXR with patchy airspace opacities in the right lower lobe. [facility] asked for admission.    Review of Systems: as per HPI otherwise negative  Past Medical History  Diagnosis Date  . Allergic reaction   . Sinus disease   . Back pain    History reviewed. No pertinent past surgical history. Social History:  reports that he has been smoking Cigarettes.  He has a 5 pack-year smoking history. He has never used smokeless tobacco. He reports that he does not drink alcohol or use illicit drugs.  No Known Allergies  History reviewed. No pertinent family history.   Prior to Admission medications   Medication Sig Start Date End Date Taking? Authorizing  Provider  predniSONE (DELTASONE) 5 MG tablet Take 1 tablet (5 mg total) by mouth daily with breakfast. 10/21/13  Yes Alison Murray, MD  predniSONE (DELTASONE) 20 MG tablet Take 3 tablets (60 mg total) by mouth daily with breakfast. 10/06/13 10/20/13  Alison Murray, MD   Physical Exam: Filed Vitals:   10/08/13 1315 10/08/13 1339 10/08/13 1400 10/08/13 1423  BP:  126/71 126/66 128/81  Pulse:    96  Temp:      TempSrc:      Resp: 17 21 21 14   SpO2: 99% 96% 93% 95%    General:  No apparent distress, appears very weak  Eyes: no scleral icterus  ENT: moist oropharynx, no ulcers in the mouth  Neck: supple, no JVD  Cardiovascular: regular rate without MRG; 2+ peripheral pulses  Respiratory: CTA biL, no wheezing  Abdomen: soft, non tender to palpation, positive bowel sounds, no guarding, no rebound  Skin: multiple macular areas with rash on face, penis, back, legs. 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    Musculoskeletal: no peripheral edema, bilateral hand MCP swelling,   Psychiatric: normal mood and affect  Neurologic: non focal, weak all over  Labs on Admission:  Basic Metabolic Panel:  Recent Labs Lab 10/04/13 1005 10/05/13 0317 10/08/13 1313  NA 133* 139 140  K 3.9 3.4* 3.4*  CL 94* 105 104  CO2 24 24 23   GLUCOSE 114* 125* 116*  BUN 14 13 11   CREATININE 0.83 0.67 0.69  CALCIUM 8.8 7.6* 8.2*   Liver Function Tests:  Recent Labs Lab  10/04/13 1005 10/08/13 1313  AST 109* 82*  ALT 50 49  ALKPHOS 79 73  BILITOT 0.8 0.4  PROT 7.9 6.7  ALBUMIN 2.2* 1.9*   CBC:  Recent Labs Lab 10/04/13 1005 10/05/13 0317 10/08/13 1313  WBC 6.6 4.1 6.7  NEUTROABS 5.0  --  5.1  HGB 13.7 11.0* 12.2*  HCT 39.7 32.1* 35.8*  MCV 82.2 83.6 82.1  PLT 256 207 266   Cardiac Enzymes:  Recent Labs Lab 10/08/13 1313  TROPONINI <0.30   BNP (last 3 results)  Recent Labs  10/08/13 1313  PROBNP 206.1*   CBG:  Recent Labs Lab 10/04/13 0929 10/04/13 0952 10/06/13 1456   GLUCAP 120* 118* 224*    Radiological Exams on Admission: Dg Chest 2 View  10/08/2013   CLINICAL DATA:  Short of breath  EXAM: CHEST  2 VIEW  COMPARISON:  Prior chest x-ray 10/04/2013  FINDINGS: Stable cardiac and mediastinal contours. Trace atherosclerotic calcification in the transverse aorta. Patchy airspace opacities in the right lower lobe conspicuous only on the frontal view. Otherwise, the lungs are clear. Mild bronchitic changes and chronic lung disease unchanged.  IMPRESSION: 1. Patchy airspace opacities in the right lower lobe seen only in the frontal view may reflect very early infiltrate in the setting of bronchopneumonia, or perhaps subsegmental atelectasis.   Electronically Signed   By: Malachy Moan M.D.   On: 10/08/2013 13:29   Dg Wrist Complete Left  10/08/2013   CLINICAL DATA:  Left wrist pain and swelling.  EXAM: LEFT WRIST - COMPLETE 3+ VIEW  COMPARISON:  None.  FINDINGS: There is no evidence of fracture or dislocation. There is no evidence of arthropathy or other focal bone abnormality. Soft tissues are unremarkable.  IMPRESSION: Negative.   Electronically Signed   By: Myles Rosenthal M.D.   On: 10/08/2013 13:34   Ct Head Wo Contrast  10/08/2013   CLINICAL DATA:  Lower extremity weakness.  EXAM: CT HEAD WITHOUT CONTRAST  TECHNIQUE: Contiguous axial images were obtained from the base of the skull through the vertex without intravenous contrast.  COMPARISON:  No priors.  FINDINGS: Slightly ill-defined area of low attenuation in the anterior limb of the right internal capsule (images 17 and 18 of series 2) could represent age-indeterminate ischemia. No other acute intracranial abnormalities. Specifically, no evidence of mass, mass effect, hydrocephalus or abnormal intra or extra-axial fluid collections. No acute displaced skull fractures are identified. Mastoids are well pneumatized bilaterally. There is complete opacification of the right frontal sinus and near complete opacification of  the maxillary sinuses bilaterally, with some mild multifocal mucosal thickening throughout the ethmoid sinuses bilaterally.  IMPRESSION: 1. Tiny ill-defined focus of low attenuation in the anterior limb of the right internal capsule could represent age-indeterminate (potentially subacute) ischemia. This could be better evaluated with MRI of the brain if clinically indicated. 2. Extensive paranasal sinus disease, as above. Given the mucoperiosteal thickening, of majority of this is favored to represent chronic disease, however, clinical correlation for signs and symptoms of acute sinusitis is recommended. These results were called by telephone at the time of interpretation on 10/08/2013 at 1:35 PM to Dr. Randa Spike, Romeo Apple, who verbally acknowledged these results.   Electronically Signed   By: Trudie Reed M.D.   On: 10/08/2013 13:39    EKG: Independently reviewed.   Assessment/Plan Principal Problem:   HCAP (healthcare-associated pneumonia) Active Problems:   Rash   Dehydration   Protein-calorie malnutrition, severe   Arthritis   Weakness  Chest pain   Elevated brain natriuretic peptide (BNP) level  HCAP - patient recently in the hospital, low grade temp, tachycardic, abnormal CXR and with cough/chest pain, will start treating as HCAP with broad spectrum Vancomycin and Zosyn. Does not meet criteria for sepsis.  - blood cultures obtained   Chest pain - likely due to #1, but high BNP, DOE, obtain 2D echo.  - observe on tele, cycle CEs.   Rash with arhtopathy - somewhat unclear diagnosis, extensive workup just last week showed non reactive RPR, negative HIV including RNA quant, negative hepatitis panel, negative mycoplasma, elevated inflammatory markers Sed rate and CRP, borderline elevation antiDNAseB (302, < 301 is normal), negative ASO, weakly positive Coxsackie B panel and negative Coxsackie A, negative GC/Chlamydia, weakly positive ANA, negative SSB La and SSA Ro, negative RF - continue  steroids, unlikely to be SJS, ?Still's - renal function normal, UA pending  Muscle weakness - will obtain a CK, unlikely but possible to be steroid induced, monitor.   Abnormal CT scan - with his constellation of symptoms unlikely to have a CVA, obtain MRI to better characterize, again RPR negative.  - may be infectious in origin, ID consulted, appreciate input.  - ?need for LP depending on MRI findings  Protein calorie malnutrition   Diet: regular  Fluids: NS DVT Prophylaxis: Lovenox  Code Status: Full  Family Communication: none  Disposition Plan: inpatient   Time spent: 6  This note has been created with Education officer, environmental. Any transcriptional errors are unintentional.   Lawrence Jennings M. Elvera Lennox, MD [facility] Pager [phone]  If 7PM-7AM, please contact night-coverage [email or web address] Password [email or web address] 10/08/2013, 3:09 PM

## 2013-10-08 NOTE — ED Notes (Signed)
Pt is aware that a urine sample is needed.  

## 2013-10-08 NOTE — Progress Notes (Addendum)
Called ED to get report after pt had already arrived to the floor from MRI. ED RN was informed that floor RN called for report about an hour ago and the ED RN was not able to give report. Floor RN's direct phone number was left for ED RN to call back with report. Per ED RN one of her coworkers attempted to call report but was told that Floor RN was busy and that she would return the call to the ED. This is not believed to be the case as Floor RN had no missed calls from the ED or from the secretary on the floor and direct number was left for call back. Report obtained at this time from ED RN via telephone, at the pt's bedside.  Cyndy Freeze

## 2013-10-09 DIAGNOSIS — M129 Arthropathy, unspecified: Secondary | ICD-10-CM

## 2013-10-09 DIAGNOSIS — R799 Abnormal finding of blood chemistry, unspecified: Secondary | ICD-10-CM

## 2013-10-09 DIAGNOSIS — I517 Cardiomegaly: Secondary | ICD-10-CM

## 2013-10-09 DIAGNOSIS — E860 Dehydration: Secondary | ICD-10-CM

## 2013-10-09 DIAGNOSIS — R079 Chest pain, unspecified: Secondary | ICD-10-CM

## 2013-10-09 LAB — COMPREHENSIVE METABOLIC PANEL
ALK PHOS: 72 U/L (ref 39–117)
ALT: 44 U/L (ref 0–53)
AST: 64 U/L — ABNORMAL HIGH (ref 0–37)
Albumin: 1.8 g/dL — ABNORMAL LOW (ref 3.5–5.2)
BUN: 15 mg/dL (ref 6–23)
CO2: 26 meq/L (ref 19–32)
Calcium: 8.2 mg/dL — ABNORMAL LOW (ref 8.4–10.5)
Chloride: 102 mEq/L (ref 96–112)
Creatinine, Ser: 0.74 mg/dL (ref 0.50–1.35)
GLUCOSE: 300 mg/dL — AB (ref 70–99)
POTASSIUM: 3.9 meq/L (ref 3.7–5.3)
Sodium: 139 mEq/L (ref 137–147)
TOTAL PROTEIN: 6.3 g/dL (ref 6.0–8.3)
Total Bilirubin: 0.4 mg/dL (ref 0.3–1.2)

## 2013-10-09 LAB — LIPID PANEL
CHOL/HDL RATIO: 7.5 ratio
Cholesterol: 172 mg/dL (ref 0–200)
HDL: 23 mg/dL — AB (ref >39)
LDL CALC: UNDETERMINED mg/dL (ref 0–99)
TRIGLYCERIDES: 521 mg/dL — AB (ref <150)
VLDL: UNDETERMINED mg/dL (ref 0–40)

## 2013-10-09 LAB — MAGNESIUM: Magnesium: 1.9 mg/dL (ref 1.5–2.5)

## 2013-10-09 LAB — CBC
HCT: 32.6 % — ABNORMAL LOW (ref 39.0–52.0)
Hemoglobin: 11 g/dL — ABNORMAL LOW (ref 13.0–17.0)
MCH: 28 pg (ref 26.0–34.0)
MCHC: 33.7 g/dL (ref 30.0–36.0)
MCV: 83 fL (ref 78.0–100.0)
PLATELETS: 268 10*3/uL (ref 150–400)
RBC: 3.93 MIL/uL — AB (ref 4.22–5.81)
RDW: 12.8 % (ref 11.5–15.5)
WBC: 3.7 10*3/uL — ABNORMAL LOW (ref 4.0–10.5)

## 2013-10-09 LAB — PHOSPHORUS: Phosphorus: 4.1 mg/dL (ref 2.3–4.6)

## 2013-10-09 LAB — TROPONIN I
Troponin I: <0.3 ng/mL (ref <0.30)
Troponin I: <0.3 ng/mL (ref <0.30)

## 2013-10-09 MED ORDER — LORAZEPAM 2 MG/ML IJ SOLN: 1.0000 mg | Freq: Once | INTRAMUSCULAR | Status: DC

## 2013-10-09 MED ORDER — ASPIRIN EC 325 MG PO TBEC
325.0000 mg | DELAYED_RELEASE_TABLET | Freq: Every day | ORAL | Status: DC
  Administered 2013-10-09 – 2013-10-11 (×3): 325 mg via ORAL
  Filled 2013-10-09 (×3): qty 1

## 2013-10-09 NOTE — Progress Notes (Addendum)
[facility] for Infectious Disease    Subjective:  Patient well known to me who was readmitted with severe joint pains, weakness, now being treated for HCAP   Antibiotics:  Anti-infectives   Start     Dose/Rate Route Frequency Ordered Stop   10/09/13 0000  vancomycin (VANCOCIN) IVPB 1000 mg/200 mL premix     1,000 mg 200 mL/hr over 60 Minutes Intravenous Every 8 hours 10/08/13 1523     10/08/13 2000  piperacillin-tazobactam (ZOSYN) IVPB 3.375 g     3.375 g 12.5 mL/hr over 240 Minutes Intravenous Every 8 hours 10/08/13 1523     10/08/13 1500  vancomycin (VANCOCIN) 1,250 mg in sodium chloride 0.9 % 250 mL IVPB     1,250 mg 166.7 mL/hr over 90 Minutes Intravenous STAT 10/08/13 1443 10/08/13 1839   10/08/13 1445  piperacillin-tazobactam (ZOSYN) IVPB 3.375 g     3.375 g 100 mL/hr over 30 Minutes Intravenous STAT 10/08/13 1443 10/08/13 1604      Medications: Scheduled Meds: . aspirin EC  325 mg Oral Daily  . enoxaparin (LOVENOX) injection  40 mg Subcutaneous Q24H  . piperacillin-tazobactam (ZOSYN)  IV  3.375 g Intravenous Q8H  . predniSONE  60 mg Oral Q breakfast  . sodium chloride  3 mL Intravenous Q12H  . vancomycin  1,000 mg Intravenous Q8H   Continuous Infusions:  PRN Meds:.oxyCODONE-acetaminophen    Objective: Weight change:   Intake/Output Summary (Last 24 hours) at 10/09/13 2146 Last data filed at 10/09/13 2026  Gross per 24 hour  Intake 1648.33 ml  Output   4950 ml  Net -3301.67 ml   Blood pressure 129/85, pulse 80, temperature 97.9 F (36.6 C), temperature source Oral, resp. rate 18, height _0  (1.803 m), weight 161 lb 13.1 oz (73.4 kg), SpO2 99.00%. Temp:  [97.5 F (36.4 C)-97.9 F (36.6 C)] 97.9 F (36.6 C) (06/07 2025) Pulse Rate:  [74-80] 80 (06/07 2025) Resp:  [18-20] 18 (06/07 2025) BP: (117-129)/(76-85) 129/85 mmHg (06/07 2025) SpO2:  [98 %-100 %] 99 % (06/07 2025)  Physical Exam: General: Alert and awake, oriented x3, not in any  acute distress.  HEENT: anicteric sclera, pupils reactive to light and accommodation, EOMI CVS regular rate, normal r,  no murmur rubs or gallops Chest: clear to auscultation bilaterally, no wheezing, rales or rhonchi Abdomen: soft nontender, nondistended, normal bowel sounds, Extremities: he has tender wrists bilaterally and tenderness along left knee with direct palpation and with flexion extension about the knee joint Skin:   Rash on face seems  Better, more dry on hands, scrotum, legs      Increased pigment over knee            Neuro: nonfocal  CBC:  Recent Labs Lab 10/04/13 1005 10/05/13 0317 10/08/13 1313 10/09/13 0704  HGB 13.7 11.0* 12.2* 11.0*  HCT 39.7 32.1* 35.8* 32.6*  PLT 256 207 266 268  INR  --   --  0.91  --      BMET  Recent Labs  10/08/13 1313 10/09/13 0704  NA 140 139  K 3.4* 3.9  CL 104 102  CO2 23 26  GLUCOSE 116* 300*  BUN 11 15  CREATININE 0.69 0.74  CALCIUM 8.2* 8.2*     Liver Panel   Recent Labs  10/08/13 1313 10/09/13 0704  PROT 6.7 6.3  ALBUMIN 1.9* 1.8*  AST 82* 64*  ALT 49 44  ALKPHOS 73 72  BILITOT 0.4 0.4  Sedimentation Rate No results found for this basename: ESRSEDRATE,  in the last 72 hours C-Reactive Protein No results found for this basename: CRP,  in the last 72 hours  Micro Results: Recent Results (from the past 240 hour(s))  CULTURE, BLOOD (ROUTINE X 2)     Status: None   Collection Time    10/04/13 10:01 AM      Result Value Ref Range Status   Specimen Description BLOOD LEFT FOREARM   Final   Special Requests BOTTLES DRAWN AEROBIC AND ANAEROBIC 2 CC   Final   Culture  Setup Time     Final   Value: 10/04/2013 12:48     Performed at [facility]   Culture     Final   Value:        BLOOD CULTURE RECEIVED NO GROWTH TO DATE CULTURE WILL BE HELD FOR 5 DAYS BEFORE ISSUING A FINAL NEGATIVE REPORT     Performed at [facility]   Report Status PENDING   Incomplete    CULTURE, BLOOD (ROUTINE X 2)     Status: None   Collection Time    10/04/13 10:01 AM      Result Value Ref Range Status   Specimen Description BLOOD LEFT ARM   Final   Special Requests BOTTLES DRAWN AEROBIC AND ANAEROBIC 5 CC EACH   Final   Culture  Setup Time     Final   Value: 10/04/2013 12:48     Performed at [facility]   Culture     Final   Value:        BLOOD CULTURE RECEIVED NO GROWTH TO DATE CULTURE WILL BE HELD FOR 5 DAYS BEFORE ISSUING A FINAL NEGATIVE REPORT     Performed at [facility]   Report Status PENDING   Incomplete  URINE CULTURE     Status: None   Collection Time    10/04/13 10:58 AM      Result Value Ref Range Status   Specimen Description URINE, CATHETERIZED   Final   Special Requests NONE   Final   Culture  Setup Time     Final   Value: 10/05/2013 02:59     Performed at [place]     Final   Value: 35,000 COLONIES/ML     Performed at [facility]   Culture     Final   Value: Multiple bacterial morphotypes present, none predominant. Suggest appropriate recollection if clinically indicated.     Performed at [facility]   Report Status 10/07/2013 FINAL   Final  GC/CHLAMYDIA PROBE AMP     Status: None   Collection Time    10/04/13  3:35 PM      Result Value Ref Range Status   CT Probe RNA NEGATIVE  NEGATIVE Final   GC Probe RNA NEGATIVE  NEGATIVE Final   Comment: (NOTE)                                                                                               **Normal Reference Range:  Negative**          Assay performed using the Gen-Probe APTIMA COMBO2 (R) Assay.     Acceptable specimen types for this assay include APTIMA Swabs (Unisex,     endocervical, urethral, or vaginal), first void urine, and ThinPrep     liquid based cytology samples.     Performed at [facility]     Status: None   Collection Time    10/04/13  3:39 PM      Result Value Ref Range  Status   Streptococcus, Group A Screen (Direct) NEGATIVE  NEGATIVE Final   Comment: (NOTE)     A Rapid Antigen test may result negative if the antigen level in the     sample is below the detection level of this test. The FDA has not     cleared this test as a stand-alone test therefore the rapid antigen     negative result has reflexed to a Group A Strep culture.  GONOCOCCUS CULTURE     Status: None   Collection Time    10/04/13  3:40 PM      Result Value Ref Range Status   Specimen Description THROAT   Final   Special Requests NONE   Final   Culture     Final   Value: NO NEISSERIA GONORRHOEAE ISOLATED     Performed at [facility]   Report Status 10/07/2013 FINAL   Final  CULTURE, BLOOD (ROUTINE X 2)     Status: None   Collection Time    10/08/13  3:00 PM      Result Value Ref Range Status   Specimen Description BLOOD LEFT ARM  2 ML IN [facility] BOTTLE   Final   Special Requests NONE   Final   Culture  Setup Time     Final   Value: 10/08/2013 19:30     Performed at [facility]   Culture     Final   Value:        BLOOD CULTURE RECEIVED NO GROWTH TO DATE CULTURE WILL BE HELD FOR 5 DAYS BEFORE ISSUING A FINAL NEGATIVE REPORT     Performed at [facility]   Report Status PENDING   Incomplete  CULTURE, BLOOD (ROUTINE X 2)     Status: None   Collection Time    10/08/13  3:10 PM      Result Value Ref Range Status   Specimen Description BLOOD RIGHT ARM  5 ML IN [facility] BOTTLE   Final   Special Requests NONE   Final   Culture  Setup Time     Final   Value: 10/08/2013 21:08     Performed at [facility]   Culture     Final   Value:        BLOOD CULTURE RECEIVED NO GROWTH TO DATE CULTURE WILL BE HELD FOR 5 DAYS BEFORE ISSUING A FINAL NEGATIVE REPORT     Performed at [facility]   Report Status PENDING   Incomplete    Studies/Results: Dg Chest 2 View  10/08/2013   CLINICAL DATA:  Short of breath  EXAM: CHEST  2 VIEW  COMPARISON:  Prior chest x-ray  10/04/2013  FINDINGS: Stable cardiac and mediastinal contours. Trace atherosclerotic calcification in the transverse aorta. Patchy airspace opacities in the right lower lobe conspicuous only on the frontal view. Otherwise, the lungs are clear. Mild bronchitic changes and chronic lung disease unchanged.  IMPRESSION: 1. Patchy airspace opacities in the right lower lobe seen only in the frontal view may reflect very early infiltrate in the setting of bronchopneumonia, or perhaps subsegmental atelectasis.   Electronically Signed   By: Jacqulynn Cadet M.D.   On: 10/08/2013 13:29   Dg Wrist Complete Left  10/08/2013   CLINICAL DATA:  Left wrist pain and swelling.  EXAM: LEFT WRIST - COMPLETE 3+ VIEW  COMPARISON:  None.  FINDINGS: There is no evidence of fracture or dislocation. There is no evidence of arthropathy or other focal bone abnormality. Soft tissues are unremarkable.  IMPRESSION: Negative.   Electronically Signed   By: Earle Gell M.D.   On: 10/08/2013 13:34   Ct Head Wo Contrast  10/08/2013   CLINICAL DATA:  Lower extremity weakness.  EXAM: CT HEAD WITHOUT CONTRAST  TECHNIQUE: Contiguous axial images were obtained from the base of the skull through the vertex without intravenous contrast.  COMPARISON:  No priors.  FINDINGS: Slightly ill-defined area of low attenuation in the anterior limb of the right internal capsule (images 17 and 18 of series 2) could represent age-indeterminate ischemia. No other acute intracranial abnormalities. Specifically, no evidence of mass, mass effect, hydrocephalus or abnormal intra or extra-axial fluid collections. No acute displaced skull fractures are identified. Mastoids are well pneumatized bilaterally. There is complete opacification of the right frontal sinus and near complete opacification of the maxillary sinuses bilaterally, with some mild multifocal mucosal thickening throughout the ethmoid sinuses bilaterally.  IMPRESSION: 1. Tiny ill-defined focus of low  attenuation in the anterior limb of the right internal capsule could represent age-indeterminate (potentially subacute) ischemia. This could be better evaluated with MRI of the brain if clinically indicated. 2. Extensive paranasal sinus disease, as above. Given the mucoperiosteal thickening, of majority of this is favored to represent chronic disease, however, clinical correlation for signs and symptoms of acute sinusitis is recommended. These results were called by telephone at the time of interpretation on 10/08/2013 at 1:35 PM to Dr. Shea Evans, Aline Brochure, who verbally acknowledged these results.   Electronically Signed   By: Vinnie Langton M.D.   On: 10/08/2013 13:39      Assessment/Plan:  Principal Problem:   HCAP (healthcare-associated pneumonia) Active Problems:   Rash   Dehydration   Protein-calorie malnutrition, severe   Arthritis   Weakness   Chest pain   Elevated brain natriuretic peptide (BNP) level    Lawrence Jennings is a 58 y.o. male  previously healthy but now with onset of rash on face (that preceded by sinus congestion and ingestion of 4 different OTC meds) brief improvement off meds then with progression on face and emergence of rash on palms, penis, scrotum back, legs. Parts of the rash have exfoliative component. He has exudate in posterior oropharynx. RPR negative, HIV, Hep panel negative. HIV RNA negative. GC probe urine negative. GC culture pending. GAS probe OP negative. ASO negative. ESR 95. CRP is 8.5. Tox screen negative. ANA + homogenous  And 1:40  RF negative. SSA slightly positive. His coxsackie B is barely + at 1:8. He was DC on 10/06/13 but came back with SEVERE JOINT PAINS in HANDS, left KNEE and weakness and was found on CXR  Possible infiltrate vs atelectasis. Plain films hands normal  He is now on vanco and zosyn  #1 Polyarticular arthritis:  Puzzling picture. I am VERY concerned that he may have a polyarticular septic arthritis that has been made worse by my  starting steroids.   Another option  would be that he has an AI arthritis that worsened when he filled the lower doses of steroids  Today I put forward idea of having an aspirate performed of left knee for cell count and differential crystals and cultures  He was very much against this today.  HOWEVER I DO NOT KNOW WHAT WE ARE TREATING AND BY GIVING HIM STEROIDS AND IV ABX we could be simultaneously potentially treating both an infection and an AI disease and we wont know which he actually has  I would actually vote in the am to stop  His antibacterial antibiotics    IF he continues to have significant joint pain stop his steroids as well and then obtain aspirate from his joint.  WE REALLY NEED TO FIND A WAY TO GET RHEUM INPUT AND IF POSSIBLE DERM INPUT AS AN INPATIENT AND WOULD EVEN CONSIDER TRANSFER TO A FACILITY THAT ACTUALLY HAS THOSE CAPABILITIES.  #2 RASH: VERY CONFUSING PICTURE. I DONT KNOW WHAT TO MAKE OF HIS COXSACKIE VIRUS TITER IT IS BARELY POSITIVE  I WILL REPEAT HIS RPR AND ASK FOR ACTUAL PROZONE TO BE DONE AND RECHECK ESR, ACE LEVEL, FERRITIN   #3 CT HEAD FINDING: NOT SURE WHAT TO MAKE OF THIS. HE IS REFUSING MRI    LOS: 1 day   Truman Hayward 10/09/2013, 9:46 PM

## 2013-10-09 NOTE — Progress Notes (Signed)
Patient ID: Lawrence Jennings  male  ZOX:[id number]    DOB: 15-Mar-1956    DOA: 10/08/2013  PCP: No primary provider on file.  Assessment/Plan: Principal Problem:   HCAP (healthcare-associated pneumonia) - Chest x-ray reviewed, patient placed on IV vancomycin and Zosyn for broad-spectrum antibiotics  Active Problems:   Rash with joint pains, arthritis, weakness - Patient had extensive workup done during the previous admission, showed non reactive RPR, negative HIV including RNA quant, negative hepatitis panel, negative mycoplasma, elevated inflammatory markers Sed rate and CRP, borderline elevation antiDNAseB (302, < 301 is normal), negative ASO, weakly positive Coxsackie B panel and negative Coxsackie A, negative GC/Chlamydia/ neisseria gonorrhea, weakly positive ANA, negative SSB La and SSA Ro, negative RF - Continue high-dose steroids - UA showed proteinuria otherwise no UTI, renal panel normal - Discussed with infectious disease, Dr. Algis LimingVanDam will see the patient today - CK is normal - Patient complaining of shortness of breath, 2-D echo ordered  Abnormal CT scan  - No neurological deficits, with his constellation of symptoms unlikely to have a CVA however needs to have MRI to better characterize, patient refused MRI yesterday due to claustrophobia. I recommended patient MRI with possible conscious sedation, he wants to think about it, does not want MRI at this time. -  RPR negative.  - may be infectious in origin, ID consulted, appreciate input.  - Cancelled MRI for this morning, will order for tomorrow under conscious sedation    Dehydration -Improved, eating oral diet without any difficulty    Protein-calorie malnutrition, severe - Nutrition consult    Chest pain,  Elevated brain natriuretic peptide (BNP) level - No chest pain at this time, 2-D echo pending  DVT Prophylaxis: Lovenox  Code Status: Full code  Family Communication: Patient alert and oriented x4, discussed with  patient  Disposition:  Consultants:  Infectious disease  Procedures:  None  Antibiotics:   IV vancomycin 6/6  IV Zosyn 6/6  Subjective: Patient seen and examined, states feels weak and had significant claustrophobia with MRI last night.   Objective: Weight change:   Intake/Output Summary (Last 24 hours) at 10/09/13 0845 Last data filed at 10/09/13 0759  Gross per 24 hour  Intake 1766.66 ml  Output   2700 ml  Net -933.34 ml   Blood pressure 117/77, pulse 74, temperature 97.5 F (36.4 C), temperature source Oral, resp. rate 20, height 5\' 11"  (1.803 m), weight 73.4 kg (161 lb 13.1 oz), SpO2 100.00%.  Physical Exam: General: Alert and awake, oriented x3, not in any acute distress. 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, Neuro: Cranial nerves II-XII intact, no focal neurological deficits  Lab Results: Basic Metabolic Panel:  Recent Labs Lab 10/08/13 1313 10/09/13 0704  NA 140 139  K 3.4* 3.9  CL 104 102  CO2 23 26  GLUCOSE 116* 300*  BUN 11 15  CREATININE 0.69 0.74  CALCIUM 8.2* 8.2*  MG  --  1.9  PHOS  --  4.1   Liver Function Tests:  Recent Labs Lab 10/08/13 1313 10/09/13 0704  AST 82* 64*  ALT 49 44  ALKPHOS 73 72  BILITOT 0.4 0.4  PROT 6.7 6.3  ALBUMIN 1.9* 1.8*   No results found for this  basename: LIPASE, AMYLASE,  in the last 168 hours No results found for this basename: AMMONIA,  in the last 168 hours CBC:  Recent Labs Lab 10/08/13 1313 10/09/13 0704  WBC 6.7 3.7*  NEUTROABS 5.1  --   HGB 12.2* 11.0*  HCT 35.8* 32.6*  MCV 82.1 83.0  PLT 266 268   Cardiac Enzymes:  Recent Labs Lab 10/08/13 1313 10/08/13 1500 10/08/13 1903  10/09/13 0120 10/09/13 0704  CKTOTAL  --  98  --   --   --   TROPONINI <0.30  --  <0.30 <0.30 <0.30   BNP: No components found with this basename: POCBNP,  CBG:  Recent Labs Lab 10/04/13 0929 10/04/13 0952 10/06/13 1456  GLUCAP 120* 118* 224*     Micro Results: Recent Results (from the past 240 hour(s))  CULTURE, BLOOD (ROUTINE X 2)     Status: None   Collection Time    10/04/13 10:01 AM      Result Value Ref Range Status   Specimen Description BLOOD LEFT FOREARM   Final   Special Requests BOTTLES DRAWN AEROBIC AND ANAEROBIC 2 CC   Final   Culture  Setup Time     Final   Value: 10/04/2013 12:48     Performed at [facility]   Culture     Final   Value:        BLOOD CULTURE RECEIVED NO GROWTH TO DATE CULTURE WILL BE HELD FOR 5 DAYS BEFORE ISSUING A FINAL NEGATIVE REPORT     Performed at [facility]   Report Status PENDING   Incomplete  CULTURE, BLOOD (ROUTINE X 2)     Status: None   Collection Time    10/04/13 10:01 AM      Result Value Ref Range Status   Specimen Description BLOOD LEFT ARM   Final   Special Requests BOTTLES DRAWN AEROBIC AND ANAEROBIC 5 CC EACH   Final   Culture  Setup Time     Final   Value: 10/04/2013 12:48     Performed at [facility]   Culture     Final   Value:        BLOOD CULTURE RECEIVED NO GROWTH TO DATE CULTURE WILL BE HELD FOR 5 DAYS BEFORE ISSUING A FINAL NEGATIVE REPORT     Performed at [facility]   Report Status PENDING   Incomplete  URINE CULTURE     Status: None   Collection Time    10/04/13 10:58 AM      Result Value Ref Range Status   Specimen Description URINE, CATHETERIZED   Final   Special Requests NONE   Final   Culture  Setup Time     Final   Value: 10/05/2013 02:59     Performed at [facility] Count     Final   Value: 35,000 COLONIES/ML     Performed at [facility]   Culture     Final   Value: Multiple bacterial morphotypes present, none predominant.  Suggest appropriate recollection if clinically indicated.     Performed at [facility]   Report Status 10/07/2013 FINAL   Final  GC/CHLAMYDIA PROBE AMP     Status: None   Collection Time    10/04/13  3:35 PM      Result Value Ref Range Status   CT Probe RNA NEGATIVE  NEGATIVE Final   GC Probe RNA NEGATIVE  NEGATIVE Final  Comment: (NOTE)                                                                                               **Normal Reference Range: Negative**          Assay performed using the Gen-Probe APTIMA COMBO2 (R) Assay.     Acceptable specimen types for this assay include APTIMA Swabs (Unisex,     endocervical, urethral, or vaginal), first void urine, and ThinPrep     liquid based cytology samples.     Performed at [facility]  RAPID STREP SCREEN     Status: None   Collection Time    10/04/13  3:39 PM      Result Value Ref Range Status   Streptococcus, Group A Screen (Direct) NEGATIVE  NEGATIVE Final   Comment: (NOTE)     A Rapid Antigen test may result negative if the antigen level in the     sample is below the detection level of this test. The FDA has not     cleared this test as a stand-alone test therefore the rapid antigen     negative result has reflexed to a Group A Strep culture.  GONOCOCCUS CULTURE     Status: None   Collection Time    10/04/13  3:40 PM      Result Value Ref Range Status   Specimen Description THROAT   Final   Special Requests NONE   Final   Culture     Final   Value: NO NEISSERIA GONORRHOEAE ISOLATED     Performed at [facility]   Report Status 10/07/2013 FINAL   Final    Studies/Results: Dg Neck Soft Tissue  10/04/2013   CLINICAL DATA:  Neck pain fever  EXAM: NECK SOFT TISSUES - 1+ VIEW  COMPARISON:  None.  FINDINGS: There is no evidence of retropharyngeal soft tissue swelling or epiglottic enlargement. The cervical airway is unremarkable and no radio-opaque foreign body identified.  IMPRESSION: Negative.    Electronically Signed   By: Salome Holmes M.D.   On: 10/04/2013 10:30   Dg Chest 2 View  10/08/2013   CLINICAL DATA:  Short of breath  EXAM: CHEST  2 VIEW  COMPARISON:  Prior chest x-ray 10/04/2013  FINDINGS: Stable cardiac and mediastinal contours. Trace atherosclerotic calcification in the transverse aorta. Patchy airspace opacities in the right lower lobe conspicuous only on the frontal view. Otherwise, the lungs are clear. Mild bronchitic changes and chronic lung disease unchanged.  IMPRESSION: 1. Patchy airspace opacities in the right lower lobe seen only in the frontal view may reflect very early infiltrate in the setting of bronchopneumonia, or perhaps subsegmental atelectasis.   Electronically Signed   By: Malachy Moan M.D.   On: 10/08/2013 13:29   Dg Chest 2 View  10/04/2013   CLINICAL DATA:  Rash and cough.  EXAM: CHEST - 2 VIEW  COMPARISON:  None  FINDINGS: The heart size and mediastinal contours are within normal limits. Mild pulmonary interstitial prominence and scarring bilaterally is likely consistent with chronic disease. There is no evidence of pulmonary edema,  consolidation, pneumothorax, nodule or pleural fluid. The visualized skeletal structures are unremarkable.  IMPRESSION: No active disease.  Probable mild chronic lung disease.   Electronically Signed   By: Irish Lack M.D.   On: 10/04/2013 10:25   Dg Wrist Complete Left  10/08/2013   CLINICAL DATA:  Left wrist pain and swelling.  EXAM: LEFT WRIST - COMPLETE 3+ VIEW  COMPARISON:  None.  FINDINGS: There is no evidence of fracture or dislocation. There is no evidence of arthropathy or other focal bone abnormality. Soft tissues are unremarkable.  IMPRESSION: Negative.   Electronically Signed   By: Myles Rosenthal M.D.   On: 10/08/2013 13:34   Ct Head Wo Contrast  10/08/2013   CLINICAL DATA:  Lower extremity weakness.  EXAM: CT HEAD WITHOUT CONTRAST  TECHNIQUE: Contiguous axial images were obtained from the base of the skull through  the vertex without intravenous contrast.  COMPARISON:  No priors.  FINDINGS: Slightly ill-defined area of low attenuation in the anterior limb of the right internal capsule (images 17 and 18 of series 2) could represent age-indeterminate ischemia. No other acute intracranial abnormalities. Specifically, no evidence of mass, mass effect, hydrocephalus or abnormal intra or extra-axial fluid collections. No acute displaced skull fractures are identified. Mastoids are well pneumatized bilaterally. There is complete opacification of the right frontal sinus and near complete opacification of the maxillary sinuses bilaterally, with some mild multifocal mucosal thickening throughout the ethmoid sinuses bilaterally.  IMPRESSION: 1. Tiny ill-defined focus of low attenuation in the anterior limb of the right internal capsule could represent age-indeterminate (potentially subacute) ischemia. This could be better evaluated with MRI of the brain if clinically indicated. 2. Extensive paranasal sinus disease, as above. Given the mucoperiosteal thickening, of majority of this is favored to represent chronic disease, however, clinical correlation for signs and symptoms of acute sinusitis is recommended. These results were called by telephone at the time of interpretation on 10/08/2013 at 1:35 PM to Dr. Randa Spike, Romeo Apple, who verbally acknowledged these results.   Electronically Signed   By: Trudie Reed M.D.   On: 10/08/2013 13:39    Medications: Scheduled Meds: . enoxaparin (LOVENOX) injection  40 mg Subcutaneous Q24H  . piperacillin-tazobactam (ZOSYN)  IV  3.375 g Intravenous Q8H  . predniSONE  60 mg Oral Q breakfast  . sodium chloride  3 mL Intravenous Q12H  . vancomycin  1,000 mg Intravenous Q8H      LOS: 1 day   Temitope Griffing Jenna Luo M.D. [facility] 10/09/2013, 8:45 AM Pager: [phone]  If 7PM-7AM, please contact night-coverage [email or web address] Password [email or web address]  **Disclaimer: This note was dictated with voice  recognition software. Similar sounding words can inadvertently be transcribed and this note may contain transcription errors which may not have been corrected upon publication of note.**

## 2013-10-09 NOTE — Progress Notes (Signed)
Echocardiogram 2D Echocardiogram has been performed.  Lawrence Jennings 10/09/2013, 10:46 AM

## 2013-10-09 NOTE — Progress Notes (Signed)
ANTIBIOTIC CONSULT NOTE - Follow up  Pharmacy Consult for vancomycin and Zosyn Indication: pneumonia  No Known Allergies  Patient Measurements: Weight: 73.4kg IBW: 75.3kg  Assessment: 57 yoM admitted 6/6 with weakness, progressive SOB, and chest pain. Patient with recent admission 6/2-6/4 for disseminated rash and discharged on high-dose prednisone taper. Pharmacy is consulted to dose vancomycin and Zosyn for HCAP.  Antiinfectives  6/6 >> vancomycin >> 6/6 >> Zosyn >>    Labs / vitals Tmax: low grade fevers WBCs: low Renal: SCr 0.74 (baseline unknown), CrCl >100 CG/N using SCr=0.8  Microbiology Previous admit 6/2 blood x2: ngtd 6/2 urine: 35k mult bacterial morphs 6/2 mycoplasma pneumoniae IgM: 63 (negative) 6/2 coxsackie virus Ab panel: B2 Ab detected 6/2 CG/Chlamydia probe: negative 6/2 rapid strep screen: negative 6/2 Gonococcus throat Cx: none isolated  Microbiology This admit 6/6 blood x2: collected  Goal of Therapy:  Vancomycin trough level 15-20 mcg/ml Zosyn per indication and renal function  Plan:  - continue Zosyn 3.375G IV q8h, each dose to be infused over 4 hours at 2000 - continue vancomycin 1g IV q8h - vancomycin trough tonight prior to 4th 1g dose due to q8h dosing - follow-up clinical course, culture results, renal function - follow-up antibiotic de-escalation and length of therapy  Thank you for the consult.  Tomi Bamberger, PharmD, BCPS Pager: [phone] Pharmacy: [phone] 10/09/2013 1:29 PM

## 2013-10-10 ENCOUNTER — Inpatient Hospital Stay (HOSPITAL_COMMUNITY): Payer: Medicaid Other

## 2013-10-10 ENCOUNTER — Encounter (HOSPITAL_COMMUNITY): Payer: Self-pay | Admitting: General Surgery

## 2013-10-10 DIAGNOSIS — R21 Rash and other nonspecific skin eruption: Secondary | ICD-10-CM

## 2013-10-10 DIAGNOSIS — L259 Unspecified contact dermatitis, unspecified cause: Secondary | ICD-10-CM

## 2013-10-10 DIAGNOSIS — R509 Fever, unspecified: Secondary | ICD-10-CM

## 2013-10-10 DIAGNOSIS — M129 Arthropathy, unspecified: Secondary | ICD-10-CM

## 2013-10-10 DIAGNOSIS — E781 Pure hyperglyceridemia: Secondary | ICD-10-CM | POA: Diagnosis present

## 2013-10-10 LAB — CBC
HEMATOCRIT: 32 % — AB (ref 39.0–52.0)
Hemoglobin: 10.9 g/dL — ABNORMAL LOW (ref 13.0–17.0)
MCH: 28.5 pg (ref 26.0–34.0)
MCHC: 34.1 g/dL (ref 30.0–36.0)
MCV: 83.8 fL (ref 78.0–100.0)
Platelets: 276 10*3/uL (ref 150–400)
RBC: 3.82 MIL/uL — ABNORMAL LOW (ref 4.22–5.81)
RDW: 12.8 % (ref 11.5–15.5)
WBC: 5.2 10*3/uL (ref 4.0–10.5)

## 2013-10-10 LAB — VANCOMYCIN, TROUGH: Vancomycin Tr: 15.1 ug/mL (ref 10.0–20.0)

## 2013-10-10 LAB — CULTURE, BLOOD (ROUTINE X 2)
Culture: NO GROWTH
Culture: NO GROWTH

## 2013-10-10 LAB — ANGIOTENSIN CONVERTING ENZYME: Angiotensin-Converting Enzyme: 63 U/L — ABNORMAL HIGH (ref 8–52)

## 2013-10-10 LAB — BASIC METABOLIC PANEL
BUN: 15 mg/dL (ref 6–23)
CALCIUM: 8.2 mg/dL — AB (ref 8.4–10.5)
CO2: 26 mEq/L (ref 19–32)
Chloride: 104 mEq/L (ref 96–112)
Creatinine, Ser: 0.69 mg/dL (ref 0.50–1.35)
GFR calc Af Amer: >90 mL/min (ref >90)
Glucose, Bld: 303 mg/dL — ABNORMAL HIGH (ref 70–99)
POTASSIUM: 3.3 meq/L — AB (ref 3.7–5.3)
Sodium: 141 mEq/L (ref 137–147)

## 2013-10-10 LAB — SEDIMENTATION RATE: Sed Rate: 72 mm/hr — ABNORMAL HIGH (ref 0–16)

## 2013-10-10 LAB — FERRITIN: Ferritin: 1590 ng/mL — ABNORMAL HIGH (ref 22–322)

## 2013-10-10 LAB — RPR

## 2013-10-10 MED ORDER — BENZONATATE 100 MG PO CAPS
100.0000 mg | ORAL_CAPSULE | Freq: Three times a day (TID) | ORAL | Status: DC | PRN
  Administered 2013-10-10 – 2013-10-11 (×5): 100 mg via ORAL
  Filled 2013-10-10 (×5): qty 1

## 2013-10-10 MED ORDER — GUAIFENESIN-CODEINE 100-10 MG/5ML PO SOLN
5.0000 mL | Freq: Four times a day (QID) | ORAL | Status: DC | PRN
  Administered 2013-10-10: 5 mL via ORAL
  Filled 2013-10-10: qty 5

## 2013-10-10 MED ORDER — GLUCERNA SHAKE PO LIQD
237.0000 mL | ORAL | Status: DC
  Administered 2013-10-10: 237 mL via ORAL
  Filled 2013-10-10 (×2): qty 237

## 2013-10-10 MED ORDER — GEMFIBROZIL 600 MG PO TABS
600.0000 mg | ORAL_TABLET | Freq: Two times a day (BID) | ORAL | Status: DC
  Administered 2013-10-10 – 2013-10-11 (×2): 600 mg via ORAL
  Filled 2013-10-10 (×4): qty 1

## 2013-10-10 MED ORDER — SIMVASTATIN 5 MG PO TABS
5.0000 mg | ORAL_TABLET | Freq: Every day | ORAL | Status: DC
  Administered 2013-10-10: 5 mg via ORAL
  Filled 2013-10-10 (×2): qty 1

## 2013-10-10 MED ORDER — LIDOCAINE HCL 1 % IJ SOLN
INTRAMUSCULAR | Status: AC
  Administered 2013-10-10: 14:00:00
  Filled 2013-10-10: qty 20

## 2013-10-10 NOTE — Procedures (Signed)
Incision and Drainage Procedure Note  Pre-operative Diagnosis: diffuse rash  Post-operative Diagnosis: same  Indications: This is a 58 yo male who developed an acute rash about 3 weeks ago after getting started on an abx.  His rash has persisted and we were asked to perform a punch biopsy  Anesthesia: 1% plain lidocaine  Procedure Details  The procedure, risks and complications have been discussed in detail (including, but not limited to infection, bleeding) with the patient, and the patient has given verbal consent to the procedure.  The skin was sterilely prepped and draped over the affected area in the usual fashion. After adequate local anesthesia, a 20mm punch biopsy was used to obtain a sample of his skin on his left lateral calf.  Hemostasis was achieved. The patient was observed until stable.   EBL: 1 cc's  Drains: non  Condition: Tolerated procedure well and Stable   Complications: none.  Lawrence Jennings 2:22 PM 10/10/2013

## 2013-10-10 NOTE — Progress Notes (Signed)
INITIAL NUTRITION ASSESSMENT  DOCUMENTATION CODES Per approved criteria  -Not Applicable   INTERVENTION: -Downgrade diet to Carb Modified -Recommend Glucerna Shake Q24H, each supplement provides 220 kcal and 10 grams of protein -Diabetic teaching as warranted -Will continue to monitor  NUTRITION DIAGNOSIS: Inadequate oral intake related to decreased appetite/dislike of food availabilty as evidenced by pt report.   Goal: Pt to meet >/= 90% of their estimated nutrition needs    Monitor:  Total protein/energy intake, labs, weights, Glucose profile, diet education needs  Reason for Assessment: MST  58 y.o. male  Admitting Dx: HCAP (healthcare-associated pneumonia)  ASSESSMENT: Lawrence Jennings is a 58 y.o. male without much past medical history recently admitted to [facility] and discharged 2 days ago presents to the ED with weakness and severe bilateral hand pain for the past day  -Pt familiar to RD from previous hospital admit on 6/02 -Reported some improvement in appetite, had been able to tolerate 50% of 3 meals/day.  -Had tried Ensure Complete during previous admit, but stopped drinking d/t dislike of taste. Was willing to trial another supplement. Recommended Glucerna d/t elevated blood sugars -Was evaluated by Diabetes Coordinator for steroid induced hyperglycemia. HgA1c pending tomorrow, and DM diet was recommended. Downgraded diet to assist with elevated CBG.Will follow up with DM2 education needs. Had made diet modification previously as he reported to be diagnosed as pre-diabetic, and was eager/interested in additional diet information pending HgA1c results -Weight stable from previous admit  Height: Ht Readings from Last 1 Encounters:  10/08/13 5\' 11"  (1.803 m)    Weight: Wt Readings from Last 1 Encounters:  10/08/13 161 lb 13.1 oz (73.4 kg)    Ideal Body Weight: 172 lbs  % Ideal Body Weight: 94%  Wt Readings from Last 10 Encounters:  10/08/13 161 lb 13.1 oz (73.4  kg)  10/05/13 162 lb 8 oz (73.71 kg)    Usual Body Weight: 190 lbs- approximately one month ago; wt stabilizing  % Usual Body Weight: 85%  BMI:  Body mass index is 22.58 kg/(m^2).  Estimated Nutritional Needs: Kcal: 1850-2050 Protein: 80-90  Fluid: >/=1850  Skin: Rash on arm, face, head, hand, back  Diet Order: Carb Control  EDUCATION NEEDS: -Education not appropriate at this time   Intake/Output Summary (Last 24 hours) at 10/10/13 1605 Last data filed at 10/10/13 1417  Gross per 24 hour  Intake   1280 ml  Output   4800 ml  Net  -3520 ml    Last BM:  6/07  Labs:   Recent Labs Lab 10/08/13 1313 10/09/13 0704 10/10/13 0402  NA 140 139 141  K 3.4* 3.9 3.3*  CL 104 102 104  CO2 23 26 26   BUN 11 15 15   CREATININE 0.69 0.74 0.69  CALCIUM 8.2* 8.2* 8.2*  MG  --  1.9  --   PHOS  --  4.1  --   GLUCOSE 116* 300* 303*    CBG (last 3)  No results found for this basename: GLUCAP,  in the last 72 hours  Scheduled Meds: . aspirin EC  325 mg Oral Daily  . enoxaparin (LOVENOX) injection  40 mg Subcutaneous Q24H  . feeding supplement (GLUCERNA SHAKE)  237 mL Oral Q24H  . gemfibrozil  600 mg Oral BID AC  . piperacillin-tazobactam (ZOSYN)  IV  3.375 g Intravenous Q8H  . predniSONE  60 mg Oral Q breakfast  . simvastatin  5 mg Oral q1800  . sodium chloride  3 mL Intravenous Q12H  Continuous Infusions:   Past Medical History  Diagnosis Date  . Allergic reaction   . Sinus disease   . Back pain     History reviewed. No pertinent past surgical history.  Lloyd Huger MS RD LDN Clinical Dietitian Pager:[phone]

## 2013-10-10 NOTE — Consult Note (Signed)
Lawrence Jennings 1955/06/23  [id number].   Requesting MD: Dr. Donnamarie Poag Chief Complaint/Reason for Consult: diffuse rash, need biopsy HPI: This is a 58 yo male who had a sinus infection 3 weeks ago and was started on abx therapy.  He then developed a diffuse maculo-papular type rash with some intermittent purulent drainage.  This was from head to toe.  He was admitted for work up.  We have been asked to proceed with a punch biopsy.  ROS: Please see HPI, otherwise he states these areas hurt with joint myalgias.  History reviewed. No pertinent family history.  Past Medical History  Diagnosis Date  . Allergic reaction   . Sinus disease   . Back pain     History reviewed. No pertinent past surgical history.  Social History:  reports that he has been smoking Cigarettes.  He has a 5 pack-year smoking history. He has never used smokeless tobacco. He reports that he does not drink alcohol or use illicit drugs.  Allergies: No Known Allergies  Medications Prior to Admission  Medication Sig Dispense Refill  . [START ON 10/21/2013] predniSONE (DELTASONE) 5 MG tablet Take 1 tablet (5 mg total) by mouth daily with breakfast.  55 tablet  0  . predniSONE (DELTASONE) 20 MG tablet Take 3 tablets (60 mg total) by mouth daily with breakfast.  42 tablet  0    Blood pressure 119/73, pulse 84, temperature 98.2 F (36.8 C), temperature source Oral, resp. rate 18, height '5\' 11"'  (1.803 m), weight 161 lb 13.1 oz (73.4 kg), SpO2 99.00%. Physical Exam: General: pleasant, WD, WN black male who is laying in bed in NAD HEENT: head is normocephalic, atraumatic.  Sclera are noninjected.  PERRL.  Ears and nose without any masses or lesions.  Mouth is pink and moist Skin: warm and dry with no masses.  Diffuse maculopapular type rash with areas of scaling and dryness.  This extends from his face, trunk, extremities. Psych: A&Ox3 with an appropriate affect.    Results for orders placed during the hospital  encounter of 10/08/13 (from the past 48 hour(s))  CULTURE, BLOOD (ROUTINE X 2)     Status: None   Collection Time    10/08/13  3:00 PM      Result Value Ref Range   Specimen Description BLOOD LEFT ARM  2 ML IN [facility] BOTTLE     Special Requests NONE     Culture  Setup Time       Value: 10/08/2013 19:30     Performed at [facility]   Culture       Value:        BLOOD CULTURE RECEIVED NO GROWTH TO DATE CULTURE WILL BE HELD FOR 5 DAYS BEFORE ISSUING A FINAL NEGATIVE REPORT     Performed at [facility]   Report Status PENDING    CK     Status: None   Collection Time    10/08/13  3:00 PM      Result Value Ref Range   Total CK 98  7 - 232 U/L  URINALYSIS, ROUTINE W REFLEX MICROSCOPIC     Status: Abnormal   Collection Time    10/08/13  3:04 PM      Result Value Ref Range   Color, Urine YELLOW  YELLOW   APPearance CLEAR  CLEAR   Specific Gravity, Urine 1.011  1.005 - 1.030   pH 6.0  5.0 - 8.0   Glucose, UA NEGATIVE  NEGATIVE mg/dL  Hgb urine dipstick SMALL (*) NEGATIVE   Bilirubin Urine NEGATIVE  NEGATIVE   Ketones, ur NEGATIVE  NEGATIVE mg/dL   Protein, ur >300 (*) NEGATIVE mg/dL   Urobilinogen, UA 4.0 (*) 0.0 - 1.0 mg/dL   Nitrite NEGATIVE  NEGATIVE   Leukocytes, UA NEGATIVE  NEGATIVE  URINE MICROSCOPIC-ADD ON     Status: Abnormal   Collection Time    10/08/13  3:04 PM      Result Value Ref Range   Squamous Epithelial / LPF RARE  RARE   RBC / HPF 0-2  <3 RBC/hpf   Bacteria, UA FEW (*) RARE  CULTURE, BLOOD (ROUTINE X 2)     Status: None   Collection Time    10/08/13  3:10 PM      Result Value Ref Range   Specimen Description BLOOD RIGHT ARM  5 ML IN [facility] BOTTLE     Special Requests NONE     Culture  Setup Time       Value: 10/08/2013 21:08     Performed at [facility]   Culture       Value:        BLOOD CULTURE RECEIVED NO GROWTH TO DATE CULTURE WILL BE HELD FOR 5 DAYS BEFORE ISSUING A FINAL NEGATIVE REPORT     Performed at [facility]    Report Status PENDING    TROPONIN I     Status: None   Collection Time    10/08/13  7:03 PM      Result Value Ref Range   Troponin I <0.30  <0.30 ng/mL   Comment:            Due to the release kinetics of cTnI,     a negative result within the first hours     of the onset of symptoms does not rule out     myocardial infarction with certainty.     If myocardial infarction is still suspected,     repeat the test at appropriate intervals.  TROPONIN I     Status: None   Collection Time    10/09/13  1:20 AM      Result Value Ref Range   Troponin I <0.30  <0.30 ng/mL   Comment:            Due to the release kinetics of cTnI,     a negative result within the first hours     of the onset of symptoms does not rule out     myocardial infarction with certainty.     If myocardial infarction is still suspected,     repeat the test at appropriate intervals.  TROPONIN I     Status: None   Collection Time    10/09/13  7:04 AM      Result Value Ref Range   Troponin I <0.30  <0.30 ng/mL   Comment:            Due to the release kinetics of cTnI,     a negative result within the first hours     of the onset of symptoms does not rule out     myocardial infarction with certainty.     If myocardial infarction is still suspected,     repeat the test at appropriate intervals.  CBC     Status: Abnormal   Collection Time    10/09/13  7:04 AM      Result Value Ref Range   WBC  3.7 (*) 4.0 - 10.5 K/uL   RBC 3.93 (*) 4.22 - 5.81 MIL/uL   Hemoglobin 11.0 (*) 13.0 - 17.0 g/dL   HCT 32.6 (*) 39.0 - 52.0 %   MCV 83.0  78.0 - 100.0 fL   MCH 28.0  26.0 - 34.0 pg   MCHC 33.7  30.0 - 36.0 g/dL   RDW 12.8  11.5 - 15.5 %   Platelets 268  150 - 400 K/uL  COMPREHENSIVE METABOLIC PANEL     Status: Abnormal   Collection Time    10/09/13  7:04 AM      Result Value Ref Range   Sodium 139  137 - 147 mEq/L   Potassium 3.9  3.7 - 5.3 mEq/L   Chloride 102  96 - 112 mEq/L   CO2 26  19 - 32 mEq/L   Glucose,  Bld 300 (*) 70 - 99 mg/dL   BUN 15  6 - 23 mg/dL   Creatinine, Ser 0.74  0.50 - 1.35 mg/dL   Calcium 8.2 (*) 8.4 - 10.5 mg/dL   Total Protein 6.3  6.0 - 8.3 g/dL   Albumin 1.8 (*) 3.5 - 5.2 g/dL   AST 64 (*) 0 - 37 U/L   ALT 44  0 - 53 U/L   Alkaline Phosphatase 72  39 - 117 U/L   Total Bilirubin 0.4  0.3 - 1.2 mg/dL   GFR calc non Af Amer >90  >90 mL/min   GFR calc Af Amer >90  >90 mL/min   Comment: (NOTE)     The eGFR has been calculated using the CKD EPI equation.     This calculation has not been validated in all clinical situations.     eGFR's persistently <90 mL/min signify possible Chronic Kidney     Disease.  MAGNESIUM     Status: None   Collection Time    10/09/13  7:04 AM      Result Value Ref Range   Magnesium 1.9  1.5 - 2.5 mg/dL  PHOSPHORUS     Status: None   Collection Time    10/09/13  7:04 AM      Result Value Ref Range   Phosphorus 4.1  2.3 - 4.6 mg/dL  LIPID PANEL     Status: Abnormal   Collection Time    10/09/13 12:15 PM      Result Value Ref Range   Cholesterol 172  0 - 200 mg/dL   Triglycerides 521 (*) <150 mg/dL   HDL 23 (*) >39 mg/dL   Total CHOL/HDL Ratio 7.5     VLDL UNABLE TO CALCULATE IF TRIGLYCERIDE OVER 400 mg/dL  0 - 40 mg/dL   LDL Cholesterol UNABLE TO CALCULATE IF TRIGLYCERIDE OVER 400 mg/dL  0 - 99 mg/dL   Comment:            Total Cholesterol/HDL:CHD Risk     Coronary Heart Disease Risk Table                         Men   Women      1/2 Average Risk   3.4   3.3      Average Risk       5.0   4.4      2 X Average Risk   9.6   7.1      3 X Average Risk  23.4   11.0  Use the calculated Patient Ratio     above and the CHD Risk Table     to determine the patient's CHD Risk.                ATP III CLASSIFICATION (LDL):      <100     mg/dL   Optimal      100-129  mg/dL   Near or Above                        Optimal      130-159  mg/dL   Borderline      160-189  mg/dL   High      >190     mg/dL   Very High     Performed  at [place], [place]     Status: None   Collection Time    10/09/13 11:08 PM      Result Value Ref Range   Vancomycin Tr 15.1  10.0 - 20.0 ug/mL  CBC     Status: Abnormal   Collection Time    10/10/13  4:02 AM      Result Value Ref Range   WBC 5.2  4.0 - 10.5 K/uL   RBC 3.82 (*) 4.22 - 5.81 MIL/uL   Hemoglobin 10.9 (*) 13.0 - 17.0 g/dL   HCT 32.0 (*) 39.0 - 52.0 %   MCV 83.8  78.0 - 100.0 fL   MCH 28.5  26.0 - 34.0 pg   MCHC 34.1  30.0 - 36.0 g/dL   RDW 12.8  11.5 - 15.5 %   Platelets 276  150 - 400 K/uL  BASIC METABOLIC PANEL     Status: Abnormal   Collection Time    10/10/13  4:02 AM      Result Value Ref Range   Sodium 141  137 - 147 mEq/L   Potassium 3.3 (*) 3.7 - 5.3 mEq/L   Chloride 104  96 - 112 mEq/L   CO2 26  19 - 32 mEq/L   Glucose, Bld 303 (*) 70 - 99 mg/dL   BUN 15  6 - 23 mg/dL   Creatinine, Ser 0.69  0.50 - 1.35 mg/dL   Calcium 8.2 (*) 8.4 - 10.5 mg/dL   GFR calc non Af Amer >90  >90 mL/min   GFR calc Af Amer >90  >90 mL/min   Comment: (NOTE)     The eGFR has been calculated using the CKD EPI equation.     This calculation has not been validated in all clinical situations.     eGFR's persistently <90 mL/min signify possible Chronic Kidney     Disease.  RPR     Status: None   Collection Time    10/10/13  4:02 AM      Result Value Ref Range   RPR NON REAC  NON REAC   Comment: Performed at [facility]     Status: Abnormal   Collection Time    10/10/13  4:02 AM      Result Value Ref Range   Ferritin 1590 (*) 22 - 322 ng/mL   Comment: (NOTE)     Result repeated and verified.     Result confirmed by automatic dilution.     Performed at [place]     Status: Abnormal   Collection Time    10/10/13  4:02 AM      Result  Value Ref Range   Sed Rate 72 (*) 0 - 16 mm/hr   Dg Chest 2 View  10/10/2013   CLINICAL DATA:  Followup airspace disease versus pneumonia  EXAM: CHEST  2 VIEW   COMPARISON:  10/08/2013  FINDINGS: Interval improvement in right lower lobe infiltrate which has nearly completely cleared and is most consistent with resolving pneumonia. No effusion. Negative for heart failure. Left lung remains clear.  IMPRESSION: Near complete resolution of right lower lobe infiltrate.   Electronically Signed   By: Franchot Gallo M.D.   On: 10/10/2013 08:51       Assessment/Plan 1. Diffuse rash  Plan: 1. Punch biopsy done.  Will be sent to pathology. 2. Dry dressing to wound.  Henreitta Cea 10/10/2013, 2:23 PM Pager: [phone]

## 2013-10-10 NOTE — Progress Notes (Addendum)
Patient ID: Lawrence Jennings  male  [id number]    DOB: 01-Jun-1955    DOA: 10/08/2013  PCP: No primary provider on file.  Assessment/Plan: Principal Problem:   HCAP (healthcare-associated pneumonia) - Chest x-ray reviewed, improving today, vancomycin discontinued by ID  Active Problems:   Rash with joint pains, arthritis, weakness unclear etiology - Patient had extensive workup done during the previous admission, showed non reactive RPR, negative HIV including RNA quant, negative hepatitis panel, negative mycoplasma, elevated inflammatory markers Sed rate and CRP, borderline elevation antiDNAseB (302, < 301 is normal), negative ASO, weakly positive Coxsackie B panel and negative Coxsackie A, negative GC/Chlamydia/ neisseria gonorrhea, weakly positive ANA, negative SSB La and SSA Ro, negative RF - Continue high-dose steroids - UA showed proteinuria otherwise no UTI, renal panel normal - Patient reports that his joint pains are somewhat improving, Dr. Drucilla Schmidt had mentioned his left knee aspiration yesterday. Patient reports that he "prayed overnight" and does not have any left knee pain today and is moving his knee without any difficulty.  - Unfortunately dermatology and rheumatology services are not available, I discussed with [facility] for possible transfer however they do not have any beds at this time - Ferritin 1590, elevated ESR and CRP, mild transaminitis with fever, joint pains, diffuse rash, possibility of still's disease or hemochromatosis. I have ordered C282Y/H63D PCR. I spoke in detail, with Dr. Amil Amen, rheumatology, recommended to have a skin biopsy if it is possible, also thinks it's possibility of Still's disease is high, recommended to keep on high-dose steroids, 60 mg prednisone okay. Dr. Amil Amen will see the patient in office on Thursday or Friday outpatient.  - I have discussed with general surgery, they will do skin biopsy today, Dr. Amil Amen will followup on the biopsy  results  Abnormal CT scan  - CT scan showed tiny ill-defined focus of low attenuation in the anterior limb of the right internal capsule could represent age-indeterminate (potentially subacute) ischemia but MRI needs to be done for better visualization - No neurological deficits, with his constellation of symptoms unlikely to have a CVA however needs to have MRI to better characterize, patient refused MRI due to claustrophobia. I had ordered MRI with conscious sedation however he refuses that as well. After discussing with the patient in detail, will repeat CT head to assess if patient has CVA.  - RPR negative, may be infectious in origin, ID consulted, appreciate input.  - continue aspirin 325 mg daily - Lipid panel showed TG 521, chol 172, started on lopid - 2D echo showed EF of 55-60%, normal wall motion  - Will check hemoglobin A1c, hyperglycemia may be due to steroids    Dehydration -Improved, eating oral diet without any difficulty    Protein-calorie malnutrition, severe - Nutrition consult    Chest pain,  Elevated brain natriuretic peptide (BNP) level - No chest pain at this time,  - 2-D echo showed EF of 55-60%, normal wall motion      DVT Prophylaxis: Lovenox  Code Status: Full code  Family Communication: Patient alert and oriented x4, discussed with patient  Disposition:  Consultants:  Infectious disease  Procedures:  None  Antibiotics:   IV vancomycin 6/6> 6/8  IV Zosyn 6/6  Subjective: Patient seen and examined, states feels weak and had significant claustrophobia with MRI last night.   Objective: Weight change:   Intake/Output Summary (Last 24 hours) at 10/10/13 1218 Last data filed at 10/10/13 1025  Gross per 24 hour  Intake  1110 ml  Output   5050 ml  Net  -3940 ml   Blood pressure 119/73, pulse 84, temperature 98.2 F (36.8 C), temperature source Oral, resp. rate 18, height '5\' 11"'  (1.803 m), weight 73.4 kg (161 lb 13.1 oz), SpO2  99.00%.  Physical Exam: General: Alert and awake, oriented x3, not in any acute distress. 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, Neuro: Cranial nerves II-XII intact, no focal neurological deficits  Lab Results: Basic Metabolic Panel:  Recent Labs Lab 10/09/13 0704 10/10/13 0402  NA 139 141  K 3.9 3.3*  CL 102 104  CO2 26 26  GLUCOSE 300* 303*  BUN 15 15  CREATININE 0.74 0.69  CALCIUM 8.2* 8.2*  MG 1.9  --   PHOS 4.1  --    Liver Function Tests:  Recent Labs Lab 10/08/13 1313 10/09/13 0704  AST 82* 64*  ALT 49 44  ALKPHOS 73 72  BILITOT 0.4 0.4  PROT 6.7 6.3  ALBUMIN 1.9* 1.8*   No results found for this basename: LIPASE, AMYLASE,  in the last 168 hours No results found for this basename: AMMONIA,  in the last 168 hours CBC:  Recent Labs Lab 10/08/13 1313 10/09/13 0704 10/10/13 0402  WBC 6.7 3.7* 5.2  NEUTROABS 5.1  --   --   HGB 12.2* 11.0* 10.9*  HCT 35.8* 32.6* 32.0*  MCV 82.1 83.0 83.8  PLT 266 268 276   Cardiac Enzymes:  Recent Labs Lab 10/08/13 1313 10/08/13 1500 10/08/13 1903 10/09/13 0120 10/09/13 0704  CKTOTAL  --  98  --   --   --   TROPONINI <0.30  --  <0.30 <0.30 <0.30   BNP: No components found with this basename: POCBNP,  CBG:  Recent Labs Lab 10/04/13 0929 10/04/13 0952 10/06/13 1456  GLUCAP 120* 118* 224*     Micro Results: Recent Results (from the past 240 hour(s))  CULTURE, BLOOD (ROUTINE X 2)     Status: None   Collection Time    10/04/13 10:01 AM      Result Value Ref Range Status   Specimen Description BLOOD LEFT FOREARM   Final   Special Requests BOTTLES DRAWN AEROBIC AND  ANAEROBIC 2 CC   Final   Culture  Setup Time     Final   Value: 10/04/2013 12:48     Performed at [facility]   Culture     Final   Value: NO GROWTH 5 DAYS     Performed at [facility]   Report Status 10/10/2013 FINAL   Final  CULTURE, BLOOD (ROUTINE X 2)     Status: None   Collection Time    10/04/13 10:01 AM      Result Value Ref Range Status   Specimen Description BLOOD LEFT ARM   Final   Special Requests BOTTLES DRAWN AEROBIC AND ANAEROBIC 5 CC EACH   Final   Culture  Setup Time     Final   Value: 10/04/2013 12:48     Performed at [facility]   Culture     Final   Value: NO GROWTH 5 DAYS     Performed at [facility]  Report Status 10/10/2013 FINAL   Final  URINE CULTURE     Status: None   Collection Time    10/04/13 10:58 AM      Result Value Ref Range Status   Specimen Description URINE, CATHETERIZED   Final   Special Requests NONE   Final   Culture  Setup Time     Final   Value: 10/05/2013 02:59     Performed at [place]     Final   Value: 35,000 COLONIES/ML     Performed at [facility]   Culture     Final   Value: Multiple bacterial morphotypes present, none predominant. Suggest appropriate recollection if clinically indicated.     Performed at [facility]   Report Status 10/07/2013 FINAL   Final  GC/CHLAMYDIA PROBE AMP     Status: None   Collection Time    10/04/13  3:35 PM      Result Value Ref Range Status   CT Probe RNA NEGATIVE  NEGATIVE Final   GC Probe RNA NEGATIVE  NEGATIVE Final   Comment: (NOTE)                                                                                               **Normal Reference Range: Negative**          Assay performed using the Gen-Probe APTIMA COMBO2 (R) Assay.     Acceptable specimen types for this assay include APTIMA Swabs (Unisex,     endocervical, urethral, or vaginal), first void urine, and ThinPrep     liquid based cytology samples.      Performed at [place]     Status: None   Collection Time    10/04/13  3:39 PM      Result Value Ref Range Status   Streptococcus, Group A Screen (Direct) NEGATIVE  NEGATIVE Final   Comment: (NOTE)     A Rapid Antigen test may result negative if the antigen level in the     sample is below the detection level of this test. The FDA has not     cleared this test as a stand-alone test therefore the rapid antigen     negative result has reflexed to a Group A Strep culture.  GONOCOCCUS CULTURE     Status: None   Collection Time    10/04/13  3:40 PM      Result Value Ref Range Status   Specimen Description THROAT   Final   Special Requests NONE   Final   Culture     Final   Value: NO NEISSERIA GONORRHOEAE ISOLATED     Performed at [facility]   Report Status 10/07/2013 FINAL   Final  CULTURE, BLOOD (ROUTINE X 2)     Status: None   Collection Time    10/08/13  3:00 PM      Result Value Ref Range Status   Specimen Description BLOOD LEFT ARM  2 ML IN [facility] BOTTLE   Final   Special Requests NONE  Final   Culture  Setup Time     Final   Value: 10/08/2013 19:30     Performed at [facility]   Culture     Final   Value:        BLOOD CULTURE RECEIVED NO GROWTH TO DATE CULTURE WILL BE HELD FOR 5 DAYS BEFORE ISSUING A FINAL NEGATIVE REPORT     Performed at [facility]   Report Status PENDING   Incomplete  CULTURE, BLOOD (ROUTINE X 2)     Status: None   Collection Time    10/08/13  3:10 PM      Result Value Ref Range Status   Specimen Description BLOOD RIGHT ARM  5 ML IN [facility] BOTTLE   Final   Special Requests NONE   Final   Culture  Setup Time     Final   Value: 10/08/2013 21:08     Performed at [facility]   Culture     Final   Value:        BLOOD CULTURE RECEIVED NO GROWTH TO DATE CULTURE WILL BE HELD FOR 5 DAYS BEFORE ISSUING A FINAL NEGATIVE REPORT     Performed at [facility]   Report Status PENDING    Incomplete    Studies/Results: Dg Neck Soft Tissue  10/04/2013   CLINICAL DATA:  Neck pain fever  EXAM: NECK SOFT TISSUES - 1+ VIEW  COMPARISON:  None.  FINDINGS: There is no evidence of retropharyngeal soft tissue swelling or epiglottic enlargement. The cervical airway is unremarkable and no radio-opaque foreign body identified.  IMPRESSION: Negative.   Electronically Signed   By: Margaree Mackintosh M.D.   On: 10/04/2013 10:30   Dg Chest 2 View  10/08/2013   CLINICAL DATA:  Short of breath  EXAM: CHEST  2 VIEW  COMPARISON:  Prior chest x-ray 10/04/2013  FINDINGS: Stable cardiac and mediastinal contours. Trace atherosclerotic calcification in the transverse aorta. Patchy airspace opacities in the right lower lobe conspicuous only on the frontal view. Otherwise, the lungs are clear. Mild bronchitic changes and chronic lung disease unchanged.  IMPRESSION: 1. Patchy airspace opacities in the right lower lobe seen only in the frontal view may reflect very early infiltrate in the setting of bronchopneumonia, or perhaps subsegmental atelectasis.   Electronically Signed   By: Jacqulynn Cadet M.D.   On: 10/08/2013 13:29   Dg Chest 2 View  10/04/2013   CLINICAL DATA:  Rash and cough.  EXAM: CHEST - 2 VIEW  COMPARISON:  None  FINDINGS: The heart size and mediastinal contours are within normal limits. Mild pulmonary interstitial prominence and scarring bilaterally is likely consistent with chronic disease. There is no evidence of pulmonary edema, consolidation, pneumothorax, nodule or pleural fluid. The visualized skeletal structures are unremarkable.  IMPRESSION: No active disease.  Probable mild chronic lung disease.   Electronically Signed   By: Aletta Edouard M.D.   On: 10/04/2013 10:25   Dg Wrist Complete Left  10/08/2013   CLINICAL DATA:  Left wrist pain and swelling.  EXAM: LEFT WRIST - COMPLETE 3+ VIEW  COMPARISON:  None.  FINDINGS: There is no evidence of fracture or dislocation. There is no evidence of  arthropathy or other focal bone abnormality. Soft tissues are unremarkable.  IMPRESSION: Negative.   Electronically Signed   By: Earle Gell M.D.   On: 10/08/2013 13:34   Ct Head Wo Contrast  10/08/2013   CLINICAL DATA:  Lower extremity weakness.  EXAM:  CT HEAD WITHOUT CONTRAST  TECHNIQUE: Contiguous axial images were obtained from the base of the skull through the vertex without intravenous contrast.  COMPARISON:  No priors.  FINDINGS: Slightly ill-defined area of low attenuation in the anterior limb of the right internal capsule (images 17 and 18 of series 2) could represent age-indeterminate ischemia. No other acute intracranial abnormalities. Specifically, no evidence of mass, mass effect, hydrocephalus or abnormal intra or extra-axial fluid collections. No acute displaced skull fractures are identified. Mastoids are well pneumatized bilaterally. There is complete opacification of the right frontal sinus and near complete opacification of the maxillary sinuses bilaterally, with some mild multifocal mucosal thickening throughout the ethmoid sinuses bilaterally.  IMPRESSION: 1. Tiny ill-defined focus of low attenuation in the anterior limb of the right internal capsule could represent age-indeterminate (potentially subacute) ischemia. This could be better evaluated with MRI of the brain if clinically indicated. 2. Extensive paranasal sinus disease, as above. Given the mucoperiosteal thickening, of majority of this is favored to represent chronic disease, however, clinical correlation for signs and symptoms of acute sinusitis is recommended. These results were called by telephone at the time of interpretation on 10/08/2013 at 1:35 PM to Dr. Shea Evans, Aline Brochure, who verbally acknowledged these results.   Electronically Signed   By: Vinnie Langton M.D.   On: 10/08/2013 13:39    Medications: Scheduled Meds: . aspirin EC  325 mg Oral Daily  . enoxaparin (LOVENOX) injection  40 mg Subcutaneous Q24H  . gemfibrozil   600 mg Oral BID AC  . piperacillin-tazobactam (ZOSYN)  IV  3.375 g Intravenous Q8H  . predniSONE  60 mg Oral Q breakfast  . sodium chloride  3 mL Intravenous Q12H      LOS: 2 days   Macklyn Glandon Krystal Eaton M.D. [facility] 10/10/2013, 12:18 PM Pager: [phone]  If 7PM-7AM, please contact night-coverage [email or web address] Password [email or web address]  **Disclaimer: This note was dictated with voice recognition software. Similar sounding words can inadvertently be transcribed and this note may contain transcription errors which may not have been corrected upon publication of note.**

## 2013-10-10 NOTE — Progress Notes (Signed)
Rx Brief Antibiotic note:  IV Vancomycin  Assessment:  VT=15.1 after 1Gm q8h @ [facility]  SCr stable  At desired goal of 15-20 for HCAP  Plan:  Continue Vancomycin @ current dose  F/u SCr/levels/cultures as needed  Lorenza Evangelist 10/10/2013 12:34 AM

## 2013-10-10 NOTE — Progress Notes (Signed)
[facility]ious [facility]  Day # 3 zosyn  Subjective:  His left knee pain has resolved, still with  Pain in digits of hands ,rash getting better    Antibiotics:  Anti-infectives   Start     Dose/Rate Route Frequency Ordered Stop   10/09/13 0000  vancomycin (VANCOCIN) IVPB 1000 mg/200 mL premix  Status:  Discontinued     1,000 mg 200 mL/hr over 60 Minutes Intravenous Every 8 hours 10/08/13 1523 10/10/13 1007   10/08/13 2000  piperacillin-tazobactam (ZOSYN) IVPB 3.375 g  Status:  Discontinued     3.375 g 12.5 mL/hr over 240 Minutes Intravenous Every 8 hours 10/08/13 1523 10/10/13 1914   10/08/13 1500  vancomycin (VANCOCIN) 1,250 mg in sodium chloride 0.9 % 250 mL IVPB     1,250 mg 166.7 mL/hr over 90 Minutes Intravenous STAT 10/08/13 1443 10/08/13 1839   10/08/13 1445  piperacillin-tazobactam (ZOSYN) IVPB 3.375 g     3.375 g 100 mL/hr over 30 Minutes Intravenous STAT 10/08/13 1443 10/08/13 1604      Medications: Scheduled Meds: . aspirin EC  325 mg Oral Daily  . enoxaparin (LOVENOX) injection  40 mg Subcutaneous Q24H  . feeding supplement (GLUCERNA SHAKE)  237 mL Oral Q24H  . gemfibrozil  600 mg Oral BID AC  . predniSONE  60 mg Oral Q breakfast  . simvastatin  5 mg Oral q1800  . sodium chloride  3 mL Intravenous Q12H   Continuous Infusions:  PRN Meds:.benzonatate, guaiFENesin-codeine, oxyCODONE-acetaminophen    Objective: Weight change:   Intake/Output Summary (Last 24 hours) at 10/10/13 1915 Last data filed at 10/10/13 1753  Gross per 24 hour  Intake   1280 ml  Output   4350 ml  Net  -3070 ml   Blood pressure 121/72, pulse 75, temperature 98.1 F (36.7 C), temperature source Oral, resp. rate 16, height '5\' 11"'  (1.803 m), weight 161 lb 13.1 oz (73.4 kg), SpO2 99.00%. Temp:  [97.9 F (36.6 C)-98.2 F (36.8 C)] 98.1 F (36.7 C) (06/08 1513) Pulse Rate:  [75-84] 75 (06/08 1513) Resp:  [16-18] 16 (06/08 1513) BP: (119-129)/(72-85) 121/72 mmHg  (06/08 1513) SpO2:  [99 %] 99 % (06/08 1513)  Physical Exam: General: Alert and awake, oriented x3, not in any acute distress.  HEENT: anicteric sclera, pupils reactive to light and accommodation, EOMI CVS regular rate, normal r,  no murmur rubs or gallops Chest: clear to auscultation bilaterally, no wheezing, rales or rhonchi Abdomen: soft nontender, nondistended, normal bowel sounds, Extremities: he has tender wrists bilaterally and tenderness along left knee with direct palpation and with flexion extension about the knee joint Skin:   Rash on face seems  Better, more dry on hands,       Better motion from left knee which is nontender now       He has pain in all digitis of hands and in wrists though less so  Neuro: nonfocal  CBC:  Recent Labs Lab 10/04/13 1005 10/05/13 0317 10/08/13 1313 10/09/13 0704 10/10/13 0402  HGB 13.7 11.0* 12.2* 11.0* 10.9*  HCT 39.7 32.1* 35.8* 32.6* 32.0*  PLT 256 207 266 268 276  INR  --   --  0.91  --   --      BMET  Recent Labs  10/09/13 0704 10/10/13 0402  NA 139 141  K 3.9 3.3*  CL 102 104  CO2 26 26  GLUCOSE 300* 303*  BUN 15 15  CREATININE 0.74 0.69  CALCIUM  8.2* 8.2*     Liver Panel   Recent Labs  10/08/13 1313 10/09/13 0704  PROT 6.7 6.3  ALBUMIN 1.9* 1.8*  AST 82* 64*  ALT 49 44  ALKPHOS 73 72  BILITOT 0.4 0.4       Sedimentation Rate  Recent Labs  10/10/13 0402  ESRSEDRATE 72*   C-Reactive Protein No results found for this basename: CRP,  in the last 72 hours  Micro Results: Recent Results (from the past 240 hour(s))  CULTURE, BLOOD (ROUTINE X 2)     Status: None   Collection Time    10/04/13 10:01 AM      Result Value Ref Range Status   Specimen Description BLOOD LEFT FOREARM   Final   Special Requests BOTTLES DRAWN AEROBIC AND ANAEROBIC 2 CC   Final   Culture  Setup Time     Final   Value: 10/04/2013 12:48     Performed at [facility]   Culture     Final   Value:  NO GROWTH 5 DAYS     Performed at [facility]   Report Status 10/10/2013 FINAL   Final  CULTURE, BLOOD (ROUTINE X 2)     Status: None   Collection Time    10/04/13 10:01 AM      Result Value Ref Range Status   Specimen Description BLOOD LEFT ARM   Final   Special Requests BOTTLES DRAWN AEROBIC AND ANAEROBIC 5 CC EACH   Final   Culture  Setup Time     Final   Value: 10/04/2013 12:48     Performed at [facility]   Culture     Final   Value: NO GROWTH 5 DAYS     Performed at [facility]   Report Status 10/10/2013 FINAL   Final  URINE CULTURE     Status: None   Collection Time    10/04/13 10:58 AM      Result Value Ref Range Status   Specimen Description URINE, CATHETERIZED   Final   Special Requests NONE   Final   Culture  Setup Time     Final   Value: 10/05/2013 02:59     Performed at [facility]     Final   Value: 35,000 COLONIES/ML     Performed at [facility]   Culture     Final   Value: Multiple bacterial morphotypes present, none predominant. Suggest appropriate recollection if clinically indicated.     Performed at [facility]   Report Status 10/07/2013 FINAL   Final  GC/CHLAMYDIA PROBE AMP     Status: None   Collection Time    10/04/13  3:35 PM      Result Value Ref Range Status   CT Probe RNA NEGATIVE  NEGATIVE Final   GC Probe RNA NEGATIVE  NEGATIVE Final   Comment: (NOTE)                                                                                               **Normal Reference Range:  Negative**          Assay performed using the Gen-Probe APTIMA COMBO2 (R) Assay.     Acceptable specimen types for this assay include APTIMA Swabs (Unisex,     endocervical, urethral, or vaginal), first void urine, and ThinPrep     liquid based cytology samples.     Performed at [facility]     Status: None   Collection Time    10/04/13  3:39 PM      Result Value Ref Range  Status   Streptococcus, Group A Screen (Direct) NEGATIVE  NEGATIVE Final   Comment: (NOTE)     A Rapid Antigen test may result negative if the antigen level in the     sample is below the detection level of this test. The FDA has not     cleared this test as a stand-alone test therefore the rapid antigen     negative result has reflexed to a Group A Strep culture.  GONOCOCCUS CULTURE     Status: None   Collection Time    10/04/13  3:40 PM      Result Value Ref Range Status   Specimen Description THROAT   Final   Special Requests NONE   Final   Culture     Final   Value: NO NEISSERIA GONORRHOEAE ISOLATED     Performed at [facility]   Report Status 10/07/2013 FINAL   Final  CULTURE, BLOOD (ROUTINE X 2)     Status: None   Collection Time    10/08/13  3:00 PM      Result Value Ref Range Status   Specimen Description BLOOD LEFT ARM  2 ML IN [facility] BOTTLE   Final   Special Requests NONE   Final   Culture  Setup Time     Final   Value: 10/08/2013 19:30     Performed at [facility]   Culture     Final   Value:        BLOOD CULTURE RECEIVED NO GROWTH TO DATE CULTURE WILL BE HELD FOR 5 DAYS BEFORE ISSUING A FINAL NEGATIVE REPORT     Performed at [facility]   Report Status PENDING   Incomplete  CULTURE, BLOOD (ROUTINE X 2)     Status: None   Collection Time    10/08/13  3:10 PM      Result Value Ref Range Status   Specimen Description BLOOD RIGHT ARM  5 ML IN [facility] BOTTLE   Final   Special Requests NONE   Final   Culture  Setup Time     Final   Value: 10/08/2013 21:08     Performed at [facility]   Culture     Final   Value:        BLOOD CULTURE RECEIVED NO GROWTH TO DATE CULTURE WILL BE HELD FOR 5 DAYS BEFORE ISSUING A FINAL NEGATIVE REPORT     Performed at [facility]   Report Status PENDING   Incomplete    Studies/Results: Dg Chest 2 View  10/10/2013   CLINICAL DATA:  Followup airspace disease versus pneumonia  EXAM: CHEST  2 VIEW   COMPARISON:  10/08/2013  FINDINGS: Interval improvement in right lower lobe infiltrate which has nearly completely cleared and is most consistent with resolving pneumonia. No effusion. Negative for heart failure. Left lung remains clear.  IMPRESSION: Near complete resolution of right lower lobe infiltrate.  Electronically Signed   By: Franchot Gallo M.D.   On: 10/10/2013 08:51   Ct Head Wo Contrast  10/10/2013   CLINICAL DATA:  History of lower extremity weakness.  Possible CVA.  EXAM: CT HEAD WITHOUT CONTRAST  TECHNIQUE: Contiguous axial images were obtained from the base of the skull through the vertex without contrast.  COMPARISON:  10/08/2013  FINDINGS: No evidence for acute hemorrhage, mass lesion, midline shift, hydrocephalus or large infarct. Again noted are subtle low-density areas near the anterior limb of the right internal capsule. These findings are similar to the previous examination. There is extensive mucosal disease in the maxillary sinuses bilaterally and cannot exclude fluid within the maxillary sinuses. Right maxillary sinus walls are thickened and suggest chronic changes. There is diffuse mucosal disease in the right frontal sinus. No acute bone abnormality.  IMPRESSION: No acute intracranial abnormality.  Subtle low-density areas along the right internal capsule anterior limb. These findings are nonspecific but could represent lacunar disease of unknown age.  Mucosal disease in the maxillary sinuses and right frontal sinus.   Electronically Signed   By: Markus Daft M.D.   On: 10/10/2013 14:58      Assessment/Plan:  Principal Problem:   HCAP (healthcare-associated pneumonia) Active Problems:   Rash   Dehydration   Protein-calorie malnutrition, severe   Arthritis   Weakness   Chest pain   Elevated brain natriuretic peptide (BNP) level   Hypertriglyceridemia    Lawrence Jennings is a 58 y.o. male  previously healthy but now with onset of rash on face (that preceded by sinus  congestion and ingestion of 4 different OTC meds) brief improvement off meds then with progression on face and emergence of rash on palms, penis, scrotum back, legs. Parts of the rash have exfoliative component. He has exudate in posterior oropharynx. RPR negative, HIV, Hep panel negative. HIV RNA negative. GC probe urine negative. GC culture pending. GAS probe OP negative. ASO negative. ESR 95. CRP is 8.5. Tox screen negative. ANA + homogenous  And 1:40  RF negative. SSA slightly positive. His coxsackie B is barely + at 1:8. He was DC on 10/06/13 but came back with SEVERE JOINT PAINS in HANDS, left KNEE and weakness and was found on CXR  Possible infiltrate vs atelectasis. Plain films hands normal. Ferritin is markely elevated  He has been on vanco and zosyn  #1 Polyarticular arthritis with diffuse rash, fevers,  fleeting pulmonary infiltrates, elevated ESR, CRP ferritin. 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  I understand that he has had punch biopsy by [facility] and I think that can be very useful  I would continue his prednisone 83m daily  I will dc his zosyn as I do not think he has a pyogenic PNA. The pulm infiltrates coinicided with his joint worsening and is undoubtedly part of the same AI pathology  I will also send an ANCA with am labs for tomorrow.  #2 RASH:see above. 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 HE IS REFUSING MRI    LOS: 2 days   CTruman Hayward6/12/2013, 7:15 PM

## 2013-10-10 NOTE — Consult Note (Signed)
Patient seen.  Explained to him that it could 2-3 days for pathology results.

## 2013-10-10 NOTE — Progress Notes (Signed)
Inpatient Diabetes Program Recommendations  AACE/ADA: New Consensus Statement on Inpatient Glycemic Control (2013)  Target Ranges:  Prepandial:   less than 140 mg/dL      Peak postprandial:   less than 180 mg/dL (1-2 hours)      Critically ill patients:  140 - 180 mg/dL   Reason for Visit: Hyperglycemia  Diabetes history: No hx Outpatient Diabetes medications: N/A Current orders for Inpatient glycemic control: None  Results for Lawrence, Jennings (MRN [id number]) as of 10/10/2013 13:04  Ref. Range 10/09/2013 07:04 10/10/2013 04:02  Glucose Latest Range: 70-99 mg/dL 497 (H) 026 (H)   Steroid-induced hyperglycemia. For HgbA1C tomorrow.  Consider addition of Novolog sensitive tidwc and hs while on steroids. Add CHO mod med diet.  Will continue to follow. Thank you. Ailene Ards, RD, LDN, CDE Inpatient Diabetes Coordinator [phone]

## 2013-10-11 DIAGNOSIS — R739 Hyperglycemia, unspecified: Secondary | ICD-10-CM | POA: Diagnosis present

## 2013-10-11 LAB — BASIC METABOLIC PANEL
BUN: 16 mg/dL (ref 6–23)
CALCIUM: 8.3 mg/dL — AB (ref 8.4–10.5)
CHLORIDE: 101 meq/L (ref 96–112)
CO2: 24 meq/L (ref 19–32)
CREATININE: 0.79 mg/dL (ref 0.50–1.35)
GFR calc Af Amer: >90 mL/min (ref >90)
GFR calc non Af Amer: >90 mL/min (ref >90)
GLUCOSE: 308 mg/dL — AB (ref 70–99)
Potassium: 3.9 mEq/L (ref 3.7–5.3)
Sodium: 138 mEq/L (ref 137–147)

## 2013-10-11 LAB — CBC
HEMATOCRIT: 31.1 % — AB (ref 39.0–52.0)
Hemoglobin: 10.9 g/dL — ABNORMAL LOW (ref 13.0–17.0)
MCH: 29.1 pg (ref 26.0–34.0)
MCHC: 35 g/dL (ref 30.0–36.0)
MCV: 82.9 fL (ref 78.0–100.0)
Platelets: 289 10*3/uL (ref 150–400)
RBC: 3.75 MIL/uL — AB (ref 4.22–5.81)
RDW: 12.9 % (ref 11.5–15.5)
WBC: 5.6 10*3/uL (ref 4.0–10.5)

## 2013-10-11 LAB — HEMOGLOBIN A1C
Hgb A1c MFr Bld: 12 % — ABNORMAL HIGH (ref <5.7)
Mean Plasma Glucose: 298 mg/dL — ABNORMAL HIGH (ref <117)

## 2013-10-11 LAB — ALDOLASE: ALDOLASE: 8.4 U/L — AB (ref <=8.1)

## 2013-10-11 MED ORDER — ASPIRIN EC 81 MG PO TBEC: 81.0000 mg | DELAYED_RELEASE_TABLET | Freq: Every day | ORAL | Status: DC

## 2013-10-11 MED ORDER — PREDNISONE 20 MG PO TABS: 60.0000 mg | ORAL_TABLET | Freq: Every day | ORAL | Status: DC

## 2013-10-11 MED ORDER — ASPIRIN 81 MG PO TBEC: 81.0000 mg | DELAYED_RELEASE_TABLET | Freq: Every day | ORAL | Status: DC

## 2013-10-11 MED ORDER — GEMFIBROZIL 600 MG PO TABS: 600.0000 mg | ORAL_TABLET | Freq: Two times a day (BID) | ORAL | Status: DC

## 2013-10-11 MED ORDER — METFORMIN HCL 500 MG PO TABS: 500.0000 mg | ORAL_TABLET | Freq: Every day | ORAL | Status: DC

## 2013-10-11 MED ORDER — GUAIFENESIN-CODEINE 100-10 MG/5ML PO SOLN: 5.0000 mL | Freq: Four times a day (QID) | ORAL | Status: DC | PRN

## 2013-10-11 MED ORDER — OXYCODONE-ACETAMINOPHEN 5-325 MG PO TABS: 1.0000 | ORAL_TABLET | Freq: Four times a day (QID) | ORAL | Status: DC | PRN

## 2013-10-11 MED ORDER — BENZONATATE 100 MG PO CAPS: 100.0000 mg | ORAL_CAPSULE | Freq: Three times a day (TID) | ORAL | Status: DC | PRN

## 2013-10-11 MED ORDER — METFORMIN HCL 500 MG PO TABS
500.0000 mg | ORAL_TABLET | Freq: Every day | ORAL | Status: DC
  Filled 2013-10-11 (×2): qty 1

## 2013-10-11 NOTE — Progress Notes (Signed)
CARE MANAGEMENT NOTE 10/11/2013  Patient:  Lawrence Jennings, Lawrence Jennings   Account Number:  [id number]  Date Initiated:  10/11/2013  Documentation initiated by:  Ezekiel Ina  Subjective/Objective Assessment:   58 y.o. male without much past medical history recently admitted to [facility] and discharged 2 days ago presents to the ED with weakness and severe bilateral hand pain for the past day.     Action/Plan:   from home   Anticipated DC Date:  10/11/2013   Anticipated DC Plan:  HOME/SELF CARE      DC Planning Services  CM consult  MATCH Program      Choice offered to / List presented to:             Status of service:   Medicare Important Message given?   (If response is "NO", the following Medicare IM given date fields will be blank) Date Medicare IM given:   Date Additional Medicare IM given:    Discharge Disposition:  HOME/SELF CARE  Per UR Regulation:  Reviewed for med. necessity/level of care/duration of stay  If discussed at Long Length of Stay Meetings, dates discussed:    Comments:  10/11/13 MMCGIBBONEY, RN, BSN Spoke with pt concerning MATCH program. Only one time in one year with a co pay. Explained to pt that one year is from today's date until next year at this date. Explained to pt that we do not have free medications any more. Pt stated that he could pay the three dollars. [employer].

## 2013-10-11 NOTE — Discharge Summary (Signed)
Physician Discharge Summary  Patient ID: [facility] MRN: [id number] DOB/AGE: Mar 18, 1956 58 y.o.  Admit date: 10/08/2013 Discharge date: 10/11/2013  Primary Care Physician:  No primary provider on file.  Discharge Diagnoses:    . HCAP (healthcare-associated pneumonia) . 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 Dehydration . Protein-calorie malnutrition, severe . Arthritis . Weakness . Chest pain . Elevated brain natriuretic peptide (BNP) level . Hypertriglyceridemia . Hyperglycemia due to steroids  Consults: Infectious disease, Dr. Drucilla Schmidt                   Rheumatology, Dr. Amil Amen over the phone   Recommendations for Outpatient Follow-up:  Patient had skin biopsy done, 6/8, results will will be followed by Dr. Amil Amen   Please note patient has hyperglycemia due to steroids, placed on metformin low-dose 500 mg with breakfast while he is on steroids. Hemoglobin A1c was ordered, still in process.  Hemochromatosis gene panel was also ordered to rule out any hemochromatosis, in process, needs to be followed up  Allergies:  No Known Allergies   Discharge Medications:   Medication List         aspirin 81 MG EC tablet  Take 1 tablet (81 mg total) by mouth daily.  Start taking on:  10/12/2013     benzonatate 100 MG capsule  Commonly known as:  TESSALON  Take 1 capsule (100 mg total) by mouth 3 (three) times daily as needed for cough.     gemfibrozil 600 MG tablet  Commonly known as:  LOPID  Take 1 tablet (600 mg total) by mouth 2 (two) times daily before a meal.     guaiFENesin-codeine 100-10 MG/5ML syrup  Take 5 mLs by mouth every 6 (six) hours as needed for cough.     metFORMIN 500 MG tablet  Commonly known as:  GLUCOPHAGE  Take 1 tablet (500 mg total) by mouth daily with breakfast.     oxyCODONE-acetaminophen 5-325 MG per tablet  Commonly known as:  PERCOCET/ROXICET  Take 1 tablet by mouth every 6 (six) hours as needed for severe pain.      predniSONE 20 MG tablet  Commonly known as:  DELTASONE  Take 3 tablets (60 mg total) by mouth daily with breakfast.         Brief H and P: For complete details please refer to admission H and P, but in brief [facility] is a 58 y.o. male without much past medical history recently admitted to [facility] and discharged 2 days  prior to this admission presented to the ED with weakness and severe bilateral hand pain for the past day. He initially presented on 6/2 with a rash. He self diagnosed with sinusitis about 3-4 weeks ago and took several over the counter medications and the next day he started seeing the rash, also combined with weakness, thought to be infectious in etiology, ID was consulted and evaluated patient and underwent extensive workup without a clear defined etiology. Patient improved and was discharged home on high dose prednisone. He states that he took that at home but has been progressively getting weak, endorsing severe shortness of breath, cough, chest pain. He felt lightheaded; denied any focal weakness, just all over. In the ED, patient is afebrile, labs are somewhat unremarkable except for mild BNP elevation. Underwent CT head for his weakness which found ill-defined focus of low attenuation in the anterior limb of the right internal capsule and had a CXR with patchy airspace opacities in the right lower  lobe. Patient was admitted for possible healthcare associated pneumonia and further workup.   Hospital Course:  HCAP (healthcare-associated pneumonia) : Patient was placed initially on IV vancomycin and Zosyn. His symptoms significantly improved and repeat chest x-ray was done on 6/8 showed near complete resolution of the right lower lobe infiltrate. Dr. Drucilla Schmidt discontinued his antibiotics on 6/8 as per ID, pulmonary infiltrates coincided with his joints worsening and possibly part of the auto immune pathology.  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 Drucilla Schmidt had mentioned his left knee aspiration on 6/7, however patient declined and subsequently the next day reported no pain in his left knee.   Ferritin came back elevated 1590, elevated ESR and CRP, mild transaminitis with fever, joint pains, diffuse rash, possibility of still's disease or hemochromatosis. I have ordered C282Y/H63D PCR which will need to be followed up. I spoke in detail, with Dr. Amil Amen, rheumatology, recommended to have a skin biopsy. General surgery was consulted and patient underwent skin biopsy, biopsy results are still pending Rheumatology recommendations, continue on high-dose steroids, 60 mg prednisone okay. Dr. Amil Amen will see the patient in office on Thursday 6/11.   Abnormal CT scan :  - CT scan showed tiny ill-defined focus of low attenuation in the anterior limb of the right internal capsule could represent age-indeterminate (potentially subacute) ischemia, recommended MRI. No neurological deficits, with his constellation of symptoms unlikely to have a CVA, patient refused MRI. Patient was placed on aspirin. Repeat CT head was done which showed no stroke or any lesions. Lipid panel showed TG 521, chol 172, started on lopid. 2D echo showed EF of 55-60%, normal wall motion   Protein-calorie malnutrition, severe  - Nutrition consult Placed during hospitalization    Chest pain, Elevated brain natriuretic peptide (BNP) level  - No chest pain at this time,  - 2-D echo showed EF of 55-60%, normal wall motion     Hypertriglyceridemia: Started on Lopid    Hyperglycemia: Likely due to high dose steroids, placed on metformin 500 mg daily, follow up on hemoglobin A1c   Day of Discharge BP 128/75  Pulse 85  Temp(Src) 97.7 F (36.5 C) (Oral)  Resp 18  Ht _0  (1.803 m)  Wt 73.4 kg (161 lb 13.1 oz)  BMI 22.58 kg/m2  SpO2 96%  Physical Exam:  General: Alert and awake, oriented x3, not in any acute distress.  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    LAB  RESULTS: Basic Metabolic Panel:  Recent Labs Lab 10/09/13 0704 10/10/13 0402 10/11/13 0425  NA 139 141 138  K 3.9 3.3* 3.9  CL 102 104 101  CO2 _0 GLUCOSE 300* 303* 308*  BUN _1 CREATININE 0.74 0.69 0.79  CALCIUM 8.2* 8.2* 8.3*  MG 1.9  --   --   PHOS 4.1  --   --    Liver Function Tests:  Recent Labs Lab 10/08/13 1313 10/09/13 0704  AST 82* 64*  ALT 49 44  ALKPHOS 73 72  BILITOT 0.4 0.4  PROT 6.7 6.3  ALBUMIN 1.9* 1.8*   No results found for this basename: LIPASE, AMYLASE,  in the last 168 hours No results found for this basename: AMMONIA,  in the last 168 hours CBC:  Recent Labs Lab 10/08/13 1313  10/10/13 0402 10/11/13 0425  WBC 6.7  < > 5.2 5.6  NEUTROABS 5.1  --   --   --   HGB 12.2*  <  > 10.9* 10.9*  HCT 35.8*  < > 32.0* 31.1*  MCV 82.1  < > 83.8 82.9  PLT 266  < > 276 289  < > = values in this interval not displayed. Cardiac Enzymes:  Recent Labs Lab 10/08/13 1313 10/08/13 1500  10/09/13 0120 10/09/13 0704  CKTOTAL  --  98  --   --   --   TROPONINI <0.30  --   < > <0.30 <0.30  < > = values in this interval not displayed. BNP: No components found with this basename: POCBNP,  CBG:  Recent Labs Lab 10/06/13 1456  GLUCAP 224*    Significant Diagnostic Studies:  Dg Chest 2 View  10/08/2013   CLINICAL DATA:  Short of breath  EXAM: CHEST  2 VIEW  COMPARISON:  Prior chest x-ray 10/04/2013  FINDINGS: Stable cardiac and mediastinal contours. Trace atherosclerotic calcification in the transverse aorta. Patchy airspace opacities in the right lower lobe conspicuous only on the frontal view. Otherwise, the lungs are clear. Mild bronchitic changes and chronic lung disease unchanged.  IMPRESSION: 1. Patchy airspace opacities in the right lower lobe seen only in the frontal view may reflect very early infiltrate in the setting of bronchopneumonia, or perhaps subsegmental atelectasis.   Electronically Signed   By: Jacqulynn Cadet M.D.   On: 10/08/2013 13:29   Dg Wrist Complete Left  10/08/2013   CLINICAL DATA:  Left wrist pain and swelling.  EXAM: LEFT WRIST - COMPLETE 3+ VIEW  COMPARISON:  None.  FINDINGS: There is no evidence of fracture or dislocation. There is no evidence of arthropathy or other focal bone abnormality. Soft tissues are unremarkable.  IMPRESSION: Negative.   Electronically Signed   By: Earle Gell M.D.   On: 10/08/2013 13:34   Ct Head Wo Contrast  10/08/2013   CLINICAL DATA:  Lower extremity weakness.  EXAM: CT HEAD WITHOUT CONTRAST  TECHNIQUE: Contiguous axial images were obtained from the base of the skull through the vertex without intravenous contrast.  COMPARISON:  No priors.  FINDINGS: Slightly ill-defined area of low attenuation in the anterior limb of  the right internal capsule (images 17 and 18 of series 2) could represent age-indeterminate ischemia. No other acute intracranial abnormalities. Specifically, no evidence of mass, mass effect, hydrocephalus or abnormal intra or extra-axial fluid collections. No acute displaced skull fractures are identified. Mastoids are well pneumatized bilaterally. There is complete opacification of the right frontal sinus and near complete opacification of  the maxillary sinuses bilaterally, with some mild multifocal mucosal thickening throughout the ethmoid sinuses bilaterally.  IMPRESSION: 1. Tiny ill-defined focus of low attenuation in the anterior limb of the right internal capsule could represent age-indeterminate (potentially subacute) ischemia. This could be better evaluated with MRI of the brain if clinically indicated. 2. Extensive paranasal sinus disease, as above. Given the mucoperiosteal thickening, of majority of this is favored to represent chronic disease, however, clinical correlation for signs and symptoms of acute sinusitis is recommended. These results were called by telephone at the time of interpretation on 10/08/2013 at 1:35 PM to Dr. Shea Evans, Aline Brochure, who verbally acknowledged these results.   Electronically Signed   By: Vinnie Langton M.D.   On: 10/08/2013 13:39    2D ECHO:  Study Conclusions  - Left ventricle: The cavity size was normal. Wall thickness was increased in a pattern of mild LVH. There was mild concentric hypertrophy. Systolic function was normal. The estimated ejection fraction was in the range of 55% to 60%. Wall motion was normal; there were no regional wall motion abnormalities. Left ventricular diastolic function parameters were normal. - Aortic valve: Valve area (Vmax): 2.31 cm^2. - Left atrium: The atrium was mildly dilated  Disposition and Follow-up: Discharge Instructions   Diet Carb Modified    Complete by:  As directed      Increase activity slowly    Complete  by:  As directed             DISPOSITION: Home  DIET:Carb modified diet  TESTS THAT NEED FOLLOW-UP Skin biopsy, hemoglobin A1c, hemochromatosis gene panel  DISCHARGE FOLLOW-UP Follow-up Information   Follow up with Hennie Duos, MD On 10/13/2013. (for hospital follow-up. Please keep your appointment and followup on the biopsy results.)    Specialty:  Rheumatology   Contact information:   [place]  [place] [address] [phone]       Follow up with Leisa Lenz, MD On 10/19/2013. (for hospital follow-up AT 10:15AM)    Specialty:  Internal Medicine   Contact information:   [place] [address] [phone]       Follow up with Scharlene Gloss, MD On 10/20/2013. (for hospital follow-up at 9:00 AM)    Specialty:  Infectious Diseases   Contact information:   [address] [address] [place] [place] [address] [phone]       Time spent on Discharge: 45 minutes  Signed:   Mendel Corning M.D. [facility] 10/11/2013, 1:23 PM Pager: [phone]   **Disclaimer: This note was dictated with voice recognition software. Similar sounding words can inadvertently be transcribed and this note may contain transcription errors which may not have been corrected upon publication of note.**

## 2013-10-14 ENCOUNTER — Encounter (HOSPITAL_COMMUNITY): Payer: Self-pay | Admitting: Emergency Medicine

## 2013-10-14 ENCOUNTER — Emergency Department (HOSPITAL_COMMUNITY): Payer: Medicaid Other

## 2013-10-14 ENCOUNTER — Inpatient Hospital Stay (HOSPITAL_COMMUNITY)
Admission: EM | Admit: 2013-10-14 | Discharge: 2013-10-18 | DRG: 545 | Disposition: A | Discharge status: Home Health | Payer: Medicaid Other | Attending: Internal Medicine | Admitting: Internal Medicine

## 2013-10-14 DIAGNOSIS — Z7982 Long term (current) use of aspirin: Secondary | ICD-10-CM

## 2013-10-14 DIAGNOSIS — E876 Hypokalemia: Secondary | ICD-10-CM | POA: Diagnosis present

## 2013-10-14 DIAGNOSIS — R627 Adult failure to thrive: Secondary | ICD-10-CM | POA: Diagnosis present

## 2013-10-14 DIAGNOSIS — Z9119 Patient's noncompliance with other medical treatment and regimen: Secondary | ICD-10-CM

## 2013-10-14 DIAGNOSIS — E43 Unspecified severe protein-calorie malnutrition: Secondary | ICD-10-CM

## 2013-10-14 DIAGNOSIS — Z79899 Other long term (current) drug therapy: Secondary | ICD-10-CM

## 2013-10-14 DIAGNOSIS — Z794 Long term (current) use of insulin: Secondary | ICD-10-CM

## 2013-10-14 DIAGNOSIS — Z91199 Patient's noncompliance with other medical treatment and regimen due to unspecified reason: Secondary | ICD-10-CM

## 2013-10-14 DIAGNOSIS — Z87891 Personal history of nicotine dependence: Secondary | ICD-10-CM

## 2013-10-14 DIAGNOSIS — IMO0002 Reserved for concepts with insufficient information to code with codable children: Secondary | ICD-10-CM

## 2013-10-14 DIAGNOSIS — L4050 Arthropathic psoriasis, unspecified: Principal | ICD-10-CM | POA: Diagnosis present

## 2013-10-14 DIAGNOSIS — E860 Dehydration: Secondary | ICD-10-CM | POA: Diagnosis present

## 2013-10-14 DIAGNOSIS — E119 Type 2 diabetes mellitus without complications: Secondary | ICD-10-CM | POA: Diagnosis present

## 2013-10-14 DIAGNOSIS — R739 Hyperglycemia, unspecified: Secondary | ICD-10-CM

## 2013-10-14 HISTORY — DX: Unspecified osteoarthritis, unspecified site: M19.90

## 2013-10-14 HISTORY — DX: Edema, unspecified: R60.9

## 2013-10-14 LAB — URINALYSIS, ROUTINE W REFLEX MICROSCOPIC
Bilirubin Urine: NEGATIVE
Glucose, UA: >1000 mg/dL — AB
Ketones, ur: NEGATIVE mg/dL
LEUKOCYTES UA: NEGATIVE
Nitrite: NEGATIVE
Specific Gravity, Urine: 1.017 (ref 1.005–1.030)
Urobilinogen, UA: 1 mg/dL (ref 0.0–1.0)
pH: 6 (ref 5.0–8.0)

## 2013-10-14 LAB — URINE MICROSCOPIC-ADD ON

## 2013-10-14 LAB — COMPREHENSIVE METABOLIC PANEL
ALT: 49 U/L (ref 0–53)
AST: 64 U/L — ABNORMAL HIGH (ref 0–37)
Albumin: 2.1 g/dL — ABNORMAL LOW (ref 3.5–5.2)
Alkaline Phosphatase: 76 U/L (ref 39–117)
BILIRUBIN TOTAL: 0.5 mg/dL (ref 0.3–1.2)
BUN: 10 mg/dL (ref 6–23)
CALCIUM: 8.4 mg/dL (ref 8.4–10.5)
CHLORIDE: 99 meq/L (ref 96–112)
CO2: 25 meq/L (ref 19–32)
CREATININE: 0.56 mg/dL (ref 0.50–1.35)
GFR calc Af Amer: >90 mL/min (ref >90)
Glucose, Bld: 189 mg/dL — ABNORMAL HIGH (ref 70–99)
Potassium: 3.5 mEq/L — ABNORMAL LOW (ref 3.7–5.3)
Sodium: 138 mEq/L (ref 137–147)
Total Protein: 6.9 g/dL (ref 6.0–8.3)

## 2013-10-14 LAB — CBC WITH DIFFERENTIAL/PLATELET
Basophils Absolute: 0 10*3/uL (ref 0.0–0.1)
Basophils Relative: 0 % (ref 0–1)
EOS PCT: 1 % (ref 0–5)
Eosinophils Absolute: 0 10*3/uL (ref 0.0–0.7)
HEMATOCRIT: 37.2 % — AB (ref 39.0–52.0)
HEMOGLOBIN: 12.7 g/dL — AB (ref 13.0–17.0)
Lymphocytes Relative: 14 % (ref 12–46)
Lymphs Abs: 0.9 10*3/uL (ref 0.7–4.0)
MCH: 28.2 pg (ref 26.0–34.0)
MCHC: 34.1 g/dL (ref 30.0–36.0)
MCV: 82.5 fL (ref 78.0–100.0)
MONO ABS: 0.7 10*3/uL (ref 0.1–1.0)
MONOS PCT: 11 % (ref 3–12)
NEUTROS ABS: 4.8 10*3/uL (ref 1.7–7.7)
Neutrophils Relative %: 74 % (ref 43–77)
Platelets: 255 10*3/uL (ref 150–400)
RBC: 4.51 MIL/uL (ref 4.22–5.81)
RDW: 13.5 % (ref 11.5–15.5)
WBC: 6.4 10*3/uL (ref 4.0–10.5)

## 2013-10-14 LAB — CULTURE, BLOOD (ROUTINE X 2)
CULTURE: NO GROWTH
Culture: NO GROWTH

## 2013-10-14 LAB — GLUCOSE, CAPILLARY
GLUCOSE-CAPILLARY: 204 mg/dL — AB (ref 70–99)
GLUCOSE-CAPILLARY: 361 mg/dL — AB (ref 70–99)
Glucose-Capillary: 400 mg/dL — ABNORMAL HIGH (ref 70–99)

## 2013-10-14 LAB — PRO B NATRIURETIC PEPTIDE: PRO B NATRI PEPTIDE: 169.4 pg/mL — AB (ref 0–125)

## 2013-10-14 LAB — TROPONIN I: Troponin I: <0.3 ng/mL (ref <0.30)

## 2013-10-14 LAB — I-STAT CG4 LACTIC ACID, ED: LACTIC ACID, VENOUS: 2.3 mmol/L — AB (ref 0.5–2.2)

## 2013-10-14 LAB — HEMOCHROMATOSIS DNA-PCR(C282Y,H63D)

## 2013-10-14 MED ORDER — ACETAMINOPHEN 650 MG RE SUPP: 650.0000 mg | Freq: Four times a day (QID) | RECTAL | Status: DC | PRN

## 2013-10-14 MED ORDER — ENOXAPARIN SODIUM 40 MG/0.4ML ~~LOC~~ SOLN
40.0000 mg | SUBCUTANEOUS | Status: DC
  Administered 2013-10-14 – 2013-10-17 (×4): 40 mg via SUBCUTANEOUS
  Filled 2013-10-14 (×5): qty 0.4

## 2013-10-14 MED ORDER — NYSTATIN 100000 UNIT/ML MT SUSP
5.0000 mL | Freq: Four times a day (QID) | OROMUCOSAL | Status: DC
  Administered 2013-10-14 – 2013-10-18 (×15): 500000 [IU] via ORAL
  Filled 2013-10-14 (×18): qty 5

## 2013-10-14 MED ORDER — SODIUM CHLORIDE 0.9 % IV BOLUS (SEPSIS)
500.0000 mL | Freq: Once | INTRAVENOUS | Status: AC
  Administered 2013-10-14: 500 mL via INTRAVENOUS

## 2013-10-14 MED ORDER — POTASSIUM CHLORIDE CRYS ER 20 MEQ PO TBCR
40.0000 meq | EXTENDED_RELEASE_TABLET | Freq: Two times a day (BID) | ORAL | Status: AC
  Administered 2013-10-14 – 2013-10-15 (×2): 40 meq via ORAL
  Filled 2013-10-14 (×2): qty 2

## 2013-10-14 MED ORDER — INSULIN ASPART 100 UNIT/ML ~~LOC~~ SOLN
5.0000 [IU] | Freq: Once | SUBCUTANEOUS | Status: AC
  Administered 2013-10-14: 5 [IU] via SUBCUTANEOUS

## 2013-10-14 MED ORDER — ASPIRIN EC 81 MG PO TBEC
81.0000 mg | DELAYED_RELEASE_TABLET | Freq: Every day | ORAL | Status: DC
  Administered 2013-10-14 – 2013-10-18 (×5): 81 mg via ORAL
  Filled 2013-10-14 (×6): qty 1

## 2013-10-14 MED ORDER — BENZONATATE 100 MG PO CAPS
200.0000 mg | ORAL_CAPSULE | Freq: Three times a day (TID) | ORAL | Status: DC | PRN
  Administered 2013-10-14 – 2013-10-18 (×8): 200 mg via ORAL
  Filled 2013-10-14 (×8): qty 2

## 2013-10-14 MED ORDER — HYDROCODONE-ACETAMINOPHEN 5-325 MG PO TABS
1.0000 | ORAL_TABLET | ORAL | Status: DC | PRN
  Administered 2013-10-14 – 2013-10-16 (×4): 2 via ORAL
  Filled 2013-10-14 (×4): qty 2

## 2013-10-14 MED ORDER — GEMFIBROZIL 600 MG PO TABS
600.0000 mg | ORAL_TABLET | Freq: Two times a day (BID) | ORAL | Status: DC
  Administered 2013-10-14 – 2013-10-18 (×8): 600 mg via ORAL
  Filled 2013-10-14 (×10): qty 1

## 2013-10-14 MED ORDER — INSULIN ASPART 100 UNIT/ML ~~LOC~~ SOLN
0.0000 [IU] | Freq: Three times a day (TID) | SUBCUTANEOUS | Status: DC
  Administered 2013-10-14: 3 [IU] via SUBCUTANEOUS

## 2013-10-14 MED ORDER — SODIUM CHLORIDE 0.9 % IV SOLN
INTRAVENOUS | Status: DC
  Administered 2013-10-14: 16:00:00 via INTRAVENOUS

## 2013-10-14 MED ORDER — SODIUM CHLORIDE 0.9 % IV SOLN
INTRAVENOUS | Status: DC
  Administered 2013-10-14 – 2013-10-16 (×8): via INTRAVENOUS

## 2013-10-14 MED ORDER — MORPHINE SULFATE 2 MG/ML IJ SOLN: 2.0000 mg | INTRAMUSCULAR | Status: DC | PRN

## 2013-10-14 MED ORDER — METHYLPREDNISOLONE SODIUM SUCC 125 MG IJ SOLR
60.0000 mg | INTRAMUSCULAR | Status: DC
  Administered 2013-10-14 – 2013-10-15 (×2): 60 mg via INTRAVENOUS
  Filled 2013-10-14 (×3): qty 0.96

## 2013-10-14 MED ORDER — ACETAMINOPHEN 325 MG PO TABS
650.0000 mg | ORAL_TABLET | Freq: Four times a day (QID) | ORAL | Status: DC | PRN
  Administered 2013-10-17: 650 mg via ORAL
  Filled 2013-10-14: qty 2

## 2013-10-14 NOTE — Progress Notes (Signed)
  CARE MANAGEMENT ED NOTE 10/14/2013  Patient:  [place] [facility], [facility]   Account Number:  [id number]  Date Initiated:  10/14/2013  Documentation initiated by:  Radford Pax  Subjective/Objective Assessment:   Patient presents to Ed with worsening diffuse joint pain and weakness over [place] past three days.     Subjective/Objective Assessment Detail:   Patient was discharged from [facility] on 06/09 with dx of pneumonia.     Action/Plan:   Action/Plan Detail:   Anticipated DC Date:       Status Recommendation to Physician:   Result of Recommendation:    Other ED Services  Consult Working Plan    DC Planning Services  CM consult  Other  PCP issues  Medication Assistance    Choice offered to / List presented to:            Status of service:  Completed, signed off  ED Comments:   ED Comments Detail:  [facility] consulted to see patient regarding medication assist and that patient could not take care of himself anymore. [facility] spoke to patient at bedside.  Patient confirms that he does not havea pcp or insurance.  Patient reports he lives at home alone.  [name]'s sister is,"Sometimes there, but when she is, she doesn't do anything."  [facility] asked patient if he has applied for Medicaid?  Patient replied, "[facility] social worker at [facility] told me they were going to do it for me, Medicaid, orange card and disability."  [facility] called financial counselor without success to try to confirm what patient was saying.  According to chart review, patient was initiated into [facility]  program on 10/11/2013.  Patient reports, "I don't recall anything about no MATCH letter."  Patient also requested if he is discharged from [facility] with prescriptions if they could call them into [facility].  "I have someone who can pick them up."  Patient reports, "I think [facility] is trying to be my primary care doctor."  Upon chart review, patient has a follow up appointment with [facility] on 10/19/2013 at  1015am at [address].  [facility] provided patient with brochure to [facility] and explined to patient that he would be able to establish care, enroll for orange card, speak to a financial counselor and receive assistance with medications.  [facility] provided patient with list of financial resources in [place] community sucha as local churches, [employer], [employer], list of discounted pharmacies and websites needymeds.org and GoodRX.com for medication assistance.  [facility] provided patient for phone number for DSS for Medicaid.  Patient thankful for resources.  No further [facility] needs at this time.

## 2013-10-14 NOTE — ED Notes (Signed)
MD at bedside. Hospitalist at bedside. 

## 2013-10-14 NOTE — ED Provider Notes (Signed)
CSN: [id number]     Arrival date & time 10/14/13  1255 History   First MD Initiated Contact with Patient 10/14/13 1300     Chief Complaint  Patient presents with  . Rash  . Joint pain      (Consider location/radiation/quality/duration/timing/severity/associated sxs/prior Treatment) Patient is a 58 y.o. male presenting with rash. The history is provided by the patient.  Rash  patient here complaining of worsening diffuse joint pain with associated weakness x3 days. Was seen by his rheumatologist today and given a dose of Humira as well as Solu-Medrol. States that he lives by himself and cannot ambulate without severe pain. No history of falls. No reported fever. He has had some nonproductive cough and dyspnea but denies any anginal type chest pain. Denies abdominal pain. Some loose stools without blood. Symptoms persistent. Recently discharged from the hospital 3 days ago.  Past Medical History  Diagnosis Date  . Allergic reaction   . Sinus disease   . Back pain    No past surgical history on file. No family history on file. History  Substance Use Topics  . Smoking status: Current Every Day Smoker -- 0.25 packs/day for 20 years    Types: Cigarettes  . Smokeless tobacco: Never Used  . Alcohol Use: No    Review of Systems  Skin: Positive for rash.  All other systems reviewed and are negative.     Allergies  Review of patient's allergies indicates no known allergies.  Home Medications   Prior to Admission medications   Medication Sig Start Date End Date Taking? Authorizing Provider  aspirin EC 81 MG EC tablet Take 1 tablet (81 mg total) by mouth daily. 10/12/13   Ripudeep Jenna Luo, MD  benzonatate (TESSALON) 100 MG capsule Take 1 capsule (100 mg total) by mouth 3 (three) times daily as needed for cough. 10/11/13   Ripudeep Jenna Luo, MD  gemfibrozil (LOPID) 600 MG tablet Take 1 tablet (600 mg total) by mouth 2 (two) times daily before a meal. 10/11/13   Ripudeep K Rai, MD   guaiFENesin-codeine 100-10 MG/5ML syrup Take 5 mLs by mouth every 6 (six) hours as needed for cough. 10/11/13   Ripudeep Jenna Luo, MD  metFORMIN (GLUCOPHAGE) 500 MG tablet Take 1 tablet (500 mg total) by mouth daily with breakfast. 10/11/13   Ripudeep Jenna Luo, MD  oxyCODONE-acetaminophen (PERCOCET/ROXICET) 5-325 MG per tablet Take 1 tablet by mouth every 6 (six) hours as needed for severe pain. 10/11/13   Ripudeep Jenna Luo, MD  predniSONE (DELTASONE) 20 MG tablet Take 3 tablets (60 mg total) by mouth daily with breakfast. 10/11/13 10/25/13  Ripudeep Jenna Luo, MD   There were no vitals taken for this visit. Physical Exam  Nursing note and vitals reviewed. Constitutional: He is oriented to person, place, and time. He appears well-developed and well-nourished.  Non-toxic appearance. No distress.  HENT:  Head: Normocephalic and atraumatic.  Eyes: Conjunctivae, EOM and lids are normal. Pupils are equal, round, and reactive to light.  Neck: Normal range of motion. Neck supple. No tracheal deviation present. No mass present.  Cardiovascular: Normal rate, regular rhythm and normal heart sounds.  Exam reveals no gallop.   No murmur heard. Pulmonary/Chest: Effort normal and breath sounds normal. No stridor. No respiratory distress. He has no decreased breath sounds. He has no wheezes. He has no rhonchi. He has no rales.  Abdominal: Soft. Normal appearance and bowel sounds are normal. He exhibits no distension. There is no tenderness. There is  no rebound and no CVA tenderness.  Musculoskeletal: Normal range of motion. He exhibits no edema and no tenderness.  Neurological: He is alert and oriented to person, place, and time. He has normal strength. No cranial nerve deficit or sensory deficit. GCS eye subscore is 4. GCS verbal subscore is 5. GCS motor subscore is 6.  Skin: Skin is warm and dry. No abrasion and no rash noted.  Diffuse psoriatic rash noted  Psychiatric: His speech is normal and behavior is normal. His affect is  blunt.    ED Course  Procedures (including critical care time) Labs Review Labs Reviewed  URINE CULTURE  CBC WITH DIFFERENTIAL  COMPREHENSIVE METABOLIC PANEL  URINALYSIS, ROUTINE W REFLEX MICROSCOPIC  I-STAT CG4 LACTIC ACID, ED    Imaging Review No results found.   EKG Interpretation None      MDM   Final diagnoses:  None    Spoke with [facility] and patient to be admitted for observation for pain control   Toy Baker, MD 10/14/13 1525

## 2013-10-14 NOTE — Progress Notes (Signed)
10/14/2013 A. Azaria Bartell RNCM 1709pm EDCM also provided patient with [place] Crisis assistance programs.  EDCM placed all printed resources into patient's purple bag.

## 2013-10-14 NOTE — H&P (Addendum)
History and Physical  Lawrence Jennings NFA:[id number] DOB: [birth year] DOA: 10/14/2013  Referring physician: Lorre Nick, MD in ED PCP: No primary provider on file.   Chief Complaint: Generalized weakness  HPI:  58 year old man with new diagnosis of psoriatic arthritis presented to the emergency department  with complaints of weakness generalized, rash, joint and muscle pain and failure to thrive. Initial investigation suggested failure to thrive and dehydration clinically. Plans were made for observation to assist with pain control and coordination of care.  Patient with no significant past medical history until approximately one month ago when he developed diffuse skin rash. He was hospitalized twice in June and discharged 6/9 after extensive investigation ultimately resulting in biopsy-proven psoriatic arthritis. He was seen by his rheumatologist for the first time today, given Depo-Medrol and Humira. The patient came of his own accord to the emergency department thereafter with complaints as above. He reports each of the last 2 times he was in the hospital he improved with fluids and medications, however upon returning home has been unable to have any of his prescriptions including steroids and has subsequently relapsed with increasing joint pain, muscle pain, difficulty ambulating and feeding himself. He reports is unable to even open canned goods.  He has had subjective fever at home, other than his joint pain, muscle is, rash and swelling no new symptoms except he said some shortness of breath which has been investigated extensively with conclusion being infiltrate related to psoriatic arthritis rather than infectious disease.  In the emergency department afebrile with stable vital signs. No hypoxia. Potassium 3.5, AST, ALT without significant change compared to previous studies. Troponin negative. Modest elevation of lactic acid. CBC stable. Recent hemoglobin A1c 12.0. Random glucose on  admission 189.  Review of Systems:  Negative for visual changes, rash, chest pain, dysuria, bleeding, n/v.  Positive for fever subjective, sore throat, shortness of breath with exertion, mild generalized abdominal pain  Past Medical History  Diagnosis Date  . Allergic reaction   . Sinus disease   . Back pain   . Arthritis   . Edema     Past Surgical History  Procedure Laterality Date  . No past surgeries      Social History:  reports that he quit smoking about 4 weeks ago. His smoking use included Cigarettes. He has a 5 pack-year smoking history. He has never used smokeless tobacco. He reports that he does not drink alcohol or use illicit drugs.  No Known Allergies  History reviewed. No pertinent family history.   Prior to Admission medications   Medication Sig Start Date End Date Taking? Authorizing Provider  aspirin EC 81 MG EC tablet Take 1 tablet (81 mg total) by mouth daily. 10/12/13   Ripudeep Jenna Luo, MD  benzonatate (TESSALON) 100 MG capsule Take 1 capsule (100 mg total) by mouth 3 (three) times daily as needed for cough. 10/11/13   Ripudeep Jenna Luo, MD  gemfibrozil (LOPID) 600 MG tablet Take 1 tablet (600 mg total) by mouth 2 (two) times daily before a meal. 10/11/13   Ripudeep K Rai, MD  guaiFENesin-codeine 100-10 MG/5ML syrup Take 5 mLs by mouth every 6 (six) hours as needed for cough. 10/11/13   Ripudeep Jenna Luo, MD  metFORMIN (GLUCOPHAGE) 500 MG tablet Take 1 tablet (500 mg total) by mouth daily with breakfast. 10/11/13   Ripudeep Jenna Luo, MD  oxyCODONE-acetaminophen (PERCOCET/ROXICET) 5-325 MG per tablet Take 1 tablet by mouth every 6 (six) hours as needed for severe pain. 10/11/13  Ripudeep Jenna Luo, MD  predniSONE (DELTASONE) 20 MG tablet Take 3 tablets (60 mg total) by mouth daily with breakfast. 10/11/13 10/25/13  Ripudeep Jenna Luo, MD   Physical Exam: Filed Vitals:   10/14/13 1317 10/14/13 1546  BP: 107/79 111/74  Pulse: 100 104  Temp: 98.6 F (37 C) 99.3 F (37.4 C)  TempSrc:  Oral Oral  Resp: 18 16  Height: 5' 11.5" (1.816 m)   Weight: 73.029 kg (161 lb)   SpO2: 97% 100%    General: Examined in the emergency department. Appears to be debilitated and chronically ill but nontoxic. Eyes: PERRL, normal lids, irises ENT: grossly normal hearing. Mucous membranes are dry. White exudate on tongue. Neck: no LAD, masses or thyromegaly Cardiovascular: RRR, no m/r/g. No LE edema. Respiratory: CTA bilaterally, no w/r/r. Normal respiratory effort. Abdomen: soft, ntnd Skin: Diffuse rash erythematous with plaques and scaling especially on the face and upper body Musculoskeletal: Globally weak, no focal deficits. Diffuse joint swelling specially of the hands. Psychiatric: Appears depressed, tearful Neurologic: grossly non-focal.  Wt Readings from Last 3 Encounters:  10/14/13 73.029 kg (161 lb)  10/08/13 73.4 kg (161 lb 13.1 oz)  10/05/13 73.71 kg (162 lb 8 oz)    Labs on Admission:  Basic Metabolic Panel:  Recent Labs Lab 10/08/13 1313 10/09/13 0704 10/10/13 0402 10/11/13 0425 10/14/13 1330  NA 140 139 141 138 138  K 3.4* 3.9 3.3* 3.9 3.5*  CL 104 102 104 101 99  CO2 23 26 26 24 25   GLUCOSE 116* 300* 303* 308* 189*  BUN 11 15 15 16 10   CREATININE 0.69 0.74 0.69 0.79 0.56  CALCIUM 8.2* 8.2* 8.2* 8.3* 8.4  MG  --  1.9  --   --   --   PHOS  --  4.1  --   --   --     Liver Function Tests:  Recent Labs Lab 10/08/13 1313 10/09/13 0704 10/14/13 1330  AST 82* 64* 64*  ALT 49 44 49  ALKPHOS 73 72 76  BILITOT 0.4 0.4 0.5  PROT 6.7 6.3 6.9  ALBUMIN 1.9* 1.8* 2.1*    CBC:  Recent Labs Lab 10/08/13 1313 10/09/13 0704 10/10/13 0402 10/11/13 0425 10/14/13 1330  WBC 6.7 3.7* 5.2 5.6 6.4  NEUTROABS 5.1  --   --   --  4.8  HGB 12.2* 11.0* 10.9* 10.9* 12.7*  HCT 35.8* 32.6* 32.0* 31.1* 37.2*  MCV 82.1 83.0 83.8 82.9 82.5  PLT 266 268 276 289 255    Cardiac Enzymes:  Recent Labs Lab 10/08/13 1313 10/08/13 1500 10/08/13 1903 10/09/13 0120  10/09/13 0704 10/14/13 1330  CKTOTAL  --  98  --   --   --   --   TROPONINI <0.30  --  <0.30 <0.30 <0.30 <0.30     Recent Labs  10/08/13 1313 10/14/13 1330  PROBNP 206.1* 169.4*    Radiological Exams on Admission: Dg Chest 2 View  10/14/2013   CLINICAL DATA:  Chest pain, shortness of breath, cough for 2 days, former smoker  EXAM: CHEST  2 VIEW  COMPARISON:  10/10/2013  FINDINGS: Normal heart size, mediastinal contours, and pulmonary vascularity.  Lungs hyperinflated with persistent accentuation of RIGHT basilar markings question infiltrate.  Minimal atelectasis at LEFT costophrenic angle.  Remaining lungs clear.  No pleural effusion or pneumothorax.  Few scattered endplate spurs lower thoracic spine.  IMPRESSION: Minimal persistent infiltrate at RIGHT lung base similar to 10/10/2013.  Minimal LEFT base atelectasis.  Electronically Signed   By: Ulyses Southward M.D.   On: 10/14/2013 14:10      Principal Problem:   Psoriatic arthritis Active Problems:   Protein-calorie malnutrition, severe   Hyperglycemia   FTT (failure to thrive) in adult   Assessment/Plan 1. Psoriatic arthritis with severe pain, polyarticular involvement and failure to thrive (reported 35 pound weight loss in last month). 2. Mild elevation of lactic acid, likely reflective of dehydration. No evidence of infection. 3. Hypokalemia. 4. Hyperglycemia with hemoglobin A1c 12.0. Suspicious for new diagnosis of diabetes mellitus. He has received steroids over the last 2 weeks but I doubt steroids which caused a significant elevation. Random blood sugar on admission less than 200, however anticipate significant elevation on steroids. 5. Modest elevation of AST of unclear significance. 6. Severe malnutrition   Failure to thrive secondary to severe psoriatic arthritis and noncompliance with medications secondary to financial reasons. Patient is at high risk for rehospitalization. Patient has poor social support and limited  financial resources.  Plan observation, IV steroids, IV fluids, hydration. Pain management.  Case management consultation. Patient will need a primary care physician.  Replete potassium.  Physical therapy consultation. May benefit from skilled nursing facility short-term rehabilitation.  Consult diabetes RN, start sliding scale insulin to assess 24-hour requirement, may benefit from long-acting insulin--but compliance likely to be an issue.  Case discussed with patient's rheumatologist Dr. Dierdre Forth (cell [phone]) who recommends steroids and supportive care. On discharge the patient should take 60 mg of prednisone daily. He has outpatient followup already arranged in 2 weeks time for second injection of Humira.  Code Status: full code  DVT prophylaxis: Lovenox Family Communication: none present Disposition Plan/Anticipated LOS: Observation  Time spent: 55 minutes  Brendia Sacks, MD  [facility] Pager [phone] 10/14/2013, 4:36 PM

## 2013-10-14 NOTE — ED Notes (Addendum)
Provided pt with water and raisin cake he brought - Dr Freida Busman aware

## 2013-10-14 NOTE — ED Notes (Signed)
Dr Dierdre Forth, now pts rheumatologist, called. Saw pt today dx pt with soyatic arthritis. Pt given humera injection and 80 mg solumedrol injection. Dr did not tell pt to come to ED, pt told dr he was coming to ED for weakness and md wanted to give ED a heads up.  Any questions call Dr Dierdre Forth [phone]

## 2013-10-14 NOTE — ED Notes (Signed)
Pt c/o generalized rash and joint pain x 1 month. Pt states he was seen at his PCP office just prior to arrival and given steroid injection and Humira.

## 2013-10-15 DIAGNOSIS — Z87891 Personal history of nicotine dependence: Secondary | ICD-10-CM | POA: Diagnosis not present

## 2013-10-15 DIAGNOSIS — Z91199 Patient's noncompliance with other medical treatment and regimen due to unspecified reason: Secondary | ICD-10-CM | POA: Diagnosis not present

## 2013-10-15 DIAGNOSIS — Z7982 Long term (current) use of aspirin: Secondary | ICD-10-CM | POA: Diagnosis not present

## 2013-10-15 DIAGNOSIS — Z794 Long term (current) use of insulin: Secondary | ICD-10-CM | POA: Diagnosis not present

## 2013-10-15 DIAGNOSIS — IMO0002 Reserved for concepts with insufficient information to code with codable children: Secondary | ICD-10-CM | POA: Diagnosis not present

## 2013-10-15 DIAGNOSIS — R5381 Other malaise: Secondary | ICD-10-CM | POA: Diagnosis not present

## 2013-10-15 DIAGNOSIS — Z79899 Other long term (current) drug therapy: Secondary | ICD-10-CM | POA: Diagnosis not present

## 2013-10-15 DIAGNOSIS — R627 Adult failure to thrive: Secondary | ICD-10-CM | POA: Diagnosis present

## 2013-10-15 DIAGNOSIS — Z9119 Patient's noncompliance with other medical treatment and regimen: Secondary | ICD-10-CM | POA: Diagnosis not present

## 2013-10-15 DIAGNOSIS — E43 Unspecified severe protein-calorie malnutrition: Secondary | ICD-10-CM | POA: Diagnosis present

## 2013-10-15 DIAGNOSIS — E876 Hypokalemia: Secondary | ICD-10-CM | POA: Diagnosis present

## 2013-10-15 DIAGNOSIS — R7309 Other abnormal glucose: Secondary | ICD-10-CM

## 2013-10-15 DIAGNOSIS — E860 Dehydration: Secondary | ICD-10-CM | POA: Diagnosis present

## 2013-10-15 DIAGNOSIS — R5383 Other fatigue: Secondary | ICD-10-CM | POA: Diagnosis not present

## 2013-10-15 DIAGNOSIS — E119 Type 2 diabetes mellitus without complications: Secondary | ICD-10-CM | POA: Diagnosis present

## 2013-10-15 DIAGNOSIS — L4050 Arthropathic psoriasis, unspecified: Secondary | ICD-10-CM | POA: Diagnosis not present

## 2013-10-15 LAB — URINE CULTURE
Colony Count: NO GROWTH
Culture: NO GROWTH

## 2013-10-15 LAB — GLUCOSE, CAPILLARY
GLUCOSE-CAPILLARY: 239 mg/dL — AB (ref 70–99)
GLUCOSE-CAPILLARY: 398 mg/dL — AB (ref 70–99)
Glucose-Capillary: 276 mg/dL — ABNORMAL HIGH (ref 70–99)
Glucose-Capillary: 213 mg/dL — ABNORMAL HIGH (ref 70–99)

## 2013-10-15 LAB — CBC
HCT: 31.1 % — ABNORMAL LOW (ref 39.0–52.0)
Hemoglobin: 10.5 g/dL — ABNORMAL LOW (ref 13.0–17.0)
MCH: 28.5 pg (ref 26.0–34.0)
MCHC: 33.8 g/dL (ref 30.0–36.0)
MCV: 84.5 fL (ref 78.0–100.0)
PLATELETS: 216 10*3/uL (ref 150–400)
RBC: 3.68 MIL/uL — ABNORMAL LOW (ref 4.22–5.81)
RDW: 13.7 % (ref 11.5–15.5)
WBC: 3.1 10*3/uL — AB (ref 4.0–10.5)

## 2013-10-15 LAB — BASIC METABOLIC PANEL
BUN: 15 mg/dL (ref 6–23)
CALCIUM: 8 mg/dL — AB (ref 8.4–10.5)
CO2: 24 mEq/L (ref 19–32)
Chloride: 102 mEq/L (ref 96–112)
Creatinine, Ser: 0.62 mg/dL (ref 0.50–1.35)
GFR calc Af Amer: >90 mL/min (ref >90)
Glucose, Bld: 291 mg/dL — ABNORMAL HIGH (ref 70–99)
Potassium: 4.4 mEq/L (ref 3.7–5.3)
SODIUM: 137 meq/L (ref 137–147)

## 2013-10-15 MED ORDER — INSULIN ASPART 100 UNIT/ML ~~LOC~~ SOLN
0.0000 [IU] | Freq: Every day | SUBCUTANEOUS | Status: DC
  Administered 2013-10-15: 5 [IU] via SUBCUTANEOUS
  Administered 2013-10-16: 3 [IU] via SUBCUTANEOUS
  Administered 2013-10-17: 4 [IU] via SUBCUTANEOUS

## 2013-10-15 MED ORDER — INSULIN ASPART 100 UNIT/ML ~~LOC~~ SOLN
0.0000 [IU] | Freq: Three times a day (TID) | SUBCUTANEOUS | Status: DC
  Administered 2013-10-15 (×2): 5 [IU] via SUBCUTANEOUS
  Administered 2013-10-15 – 2013-10-16 (×3): 8 [IU] via SUBCUTANEOUS
  Administered 2013-10-16: 15 [IU] via SUBCUTANEOUS
  Administered 2013-10-17 (×3): 8 [IU] via SUBCUTANEOUS
  Administered 2013-10-18: 5 [IU] via SUBCUTANEOUS

## 2013-10-15 NOTE — Progress Notes (Signed)
Consult received for SNF placement.  Noted that PT recommending HHPT.  No CSW needs identified.  Providence Crosby, LCSW Clinical Social Work [phone]

## 2013-10-15 NOTE — Progress Notes (Signed)
  PROGRESS NOTE  Lawrence Jennings GUY:[id number] DOB: Nov 12, 1955 DOA: 10/14/2013 PCP: No primary provider on file. Rheumatologist Dr. Dierdre Forth  Summary: 58 year old man with new diagnosis of psoriatic arthritis presented to the emergency department with complaints of weakness generalized, rash, joint and muscle pain and failure to thrive. Initial investigation suggested failure to thrive and dehydration clinically. Plans were made for observation to assist with pain control and coordination of care.  Assessment/Plan: 1. Psoriatic arthritis with severe pain, and polyarticular involvement and failure to thrive. Improved today with steroids. 2. Dehydration with modest elevation of lactate. Improved. 3. Hyperglycemia with, suspect diabetes mellitus, new diagnosis. 4. Hypokalemia, resolved with repletion. 5. Severe malnutrition   Continue steroids, transition 6/40 to prednisone 60 mg daily  Continue IV fluids  Follow blood sugars. Likely discharged on metformin as compliance may be an issue and hopefully blood sugars will improve on decreased dose of steroids.  Code Status: full code DVT prophylaxis: Lovenox Family Communication: none present Disposition Plan: home with [facility] PT 1-2 days  Brendia Sacks, MD  [facility]  Pager [phone] If 7PM-7AM, please contact night-coverage at [email or web address], password [facility] 10/15/2013, 12:36 PM  LOS: 1 day   Consultants:  Physical therapy, home health  Procedures:    Antibiotics:    HPI/Subjective: Feeling better today. Joints are still painful but has better range of motion. Eating well, drinking well. Able to ambulate with a cane.  Objective: Filed Vitals:   10/14/13 1546 10/14/13 1701 10/14/13 2213 10/15/13 0639  BP: 111/74 128/77 122/77 126/78  Pulse: 104 87 98 80  Temp: 99.3 F (37.4 C) 97.9 F (36.6 C) 98.3 F (36.8 C) 97.4 F (36.3 C)  TempSrc: Oral Oral Oral Oral  Resp: 16 18 18 18   Height:  5' 11.5" (1.816 m)      Weight:  72.394 kg (159 lb 9.6 oz)    SpO2: 100% 97% 97% 100%    Intake/Output Summary (Last 24 hours) at 10/15/13 1236 Last data filed at 10/15/13 0802  Gross per 24 hour  Intake 2383.33 ml  Output   1860 ml  Net 523.33 ml     Filed Weights   10/14/13 1317 10/14/13 1701  Weight: 73.029 kg (161 lb) 72.394 kg (159 lb 9.6 oz)    Exam:   Afebrile, vital signs stable. No hypoxia. Gen. Appears calm and comfortable. Much better appearing today. Cardiovascular. Regular rate and rhythm. No murmur, rub or gallop. No lower extremity edema. Respiratory. Clear to auscultation bilaterally. No wheezes, rales or rhonchi. Normal respiratory effort. Skin. Defuse rash somewhat improved.  Data Reviewed: I/O: -1860 Chemistry: Capillary blood sugars 200-400. Basic metabolic panel unremarkable except for hyperglycemia, hypokalemia has resolved. Heme: Hemoglobin stable, 10.5 ID: Urine culture pending  Scheduled Meds: . aspirin EC  81 mg Oral Daily  . enoxaparin (LOVENOX) injection  40 mg Subcutaneous Q24H  . gemfibrozil  600 mg Oral BID AC  . insulin aspart  0-15 Units Subcutaneous TID WC  . insulin aspart  0-5 Units Subcutaneous QHS  . methylPREDNISolone (SOLU-MEDROL) injection  60 mg Intravenous Q24H  . nystatin  5 mL Oral QID   Continuous Infusions: . sodium chloride 150 mL/hr at 10/15/13 10/17/13    Principal Problem:   Psoriatic arthritis Active Problems:   Protein-calorie malnutrition, severe   Hyperglycemia   FTT (failure to thrive) in adult   Time spent 15 minutes

## 2013-10-15 NOTE — Evaluation (Signed)
Physical Therapy Evaluation Patient Details Name: Lawrence Jennings MRN: [id number] DOB: 05/25/1955 Today's Date: 10/15/2013   History of Present Illness  58 year old man with new diagnosis of psoriatic arthritis presented to the emergency department  with complaints of weakness generalized, rash, joint and muscle pain and failure to thrive. Initial investigation suggested failure to thrive and dehydration clinically. Plans were made for observation to assist with pain control and coordination of care.  Clinical Impression  Pt is mod I with mobility and gait, continues with pain in LE joints.  Pt will benefit from HHPT for balance, strengthening and pain control, no further acute PT needs identified at this time.    Follow Up Recommendations Home health PT    Equipment Recommendations  None recommended by PT    Recommendations for Other Services       Precautions / Restrictions Restrictions Weight Bearing Restrictions: No      Mobility  Bed Mobility                  Transfers Overall transfer level: Modified independent                  Ambulation/Gait Ambulation/Gait assistance: Modified independent (Device/Increase time) Ambulation Distance (Feet): 25 Feet Assistive device: Straight cane       General Gait Details: decreased knee flexion due to pain in joints  Stairs            Wheelchair Mobility    Modified Rankin (Stroke Patients Only)       Balance                                             Pertinent Vitals/Pain Pt c/o pain in joints but is feeling "much better".  RN aware of pain    Home Living Family/patient expects to be discharged to:: Private residence Living Arrangements: Alone Available Help at Discharge: Family;Available PRN/intermittently Type of Home: House Home Access: Level entry     Home Layout: One level Home Equipment: Cane - single point      Prior Function Level of Independence:  Independent with assistive device(s)         Comments: uses SPC     Hand Dominance        Extremity/Trunk Assessment               Lower Extremity Assessment: Generalized weakness         Communication   Communication: No difficulties  Cognition Arousal/Alertness: Awake/alert Behavior During Therapy: WFL for tasks assessed/performed Overall Cognitive Status: Within Functional Limits for tasks assessed                      General Comments      Exercises        Assessment/Plan    PT Assessment All further PT needs can be met in the next venue of care  PT Diagnosis Acute pain;Generalized weakness;Difficulty walking   PT Problem List Decreased strength;Decreased activity tolerance;Decreased balance;Pain  PT Treatment Interventions     PT Goals (Current goals can be found in the Care Plan section) Acute Rehab PT Goals PT Goal Formulation: No goals set, d/c therapy    Frequency     Barriers to discharge        Co-evaluation  End of Session   Activity Tolerance: Patient tolerated treatment well Patient left: with nursing/sitter in room;in bed;with call bell/phone within reach      Functional Assessment Tool Used: clinical judgement Functional Limitation: Mobility: Walking and moving around Mobility: Walking and Moving Around Current Status (Y1950): At least 1 percent but less than 20 percent impaired, limited or restricted Mobility: Walking and Moving Around Goal Status [phone]): At least 1 percent but less than 20 percent impaired, limited or restricted Mobility: Walking and Moving Around Discharge Status [phone]): At least 1 percent but less than 20 percent impaired, limited or restricted    Time: 1045-1056 PT Time Calculation (min): 11 min   Charges:   PT Evaluation $Initial PT Evaluation Tier I: 1 Procedure     PT G Codes:   Functional Assessment Tool Used: clinical judgement Functional Limitation: Mobility:  Walking and moving around    [facility] 10/15/2013, 11:00 AM

## 2013-10-16 DIAGNOSIS — E860 Dehydration: Secondary | ICD-10-CM

## 2013-10-16 LAB — GLUCOSE, CAPILLARY
GLUCOSE-CAPILLARY: 385 mg/dL — AB (ref 70–99)
Glucose-Capillary: 251 mg/dL — ABNORMAL HIGH (ref 70–99)
Glucose-Capillary: 256 mg/dL — ABNORMAL HIGH (ref 70–99)

## 2013-10-16 MED ORDER — INSULIN ASPART 100 UNIT/ML ~~LOC~~ SOLN
2.0000 [IU] | Freq: Three times a day (TID) | SUBCUTANEOUS | Status: DC
  Administered 2013-10-17 – 2013-10-18 (×4): 2 [IU] via SUBCUTANEOUS

## 2013-10-16 MED ORDER — PREDNISONE 20 MG PO TABS
40.0000 mg | ORAL_TABLET | Freq: Every day | ORAL | Status: DC
Start: 2013-10-17 — End: 2013-10-18
  Administered 2013-10-17 – 2013-10-18 (×2): 40 mg via ORAL
  Filled 2013-10-16 (×3): qty 2

## 2013-10-16 MED ORDER — LIVING WELL WITH DIABETES BOOK
Freq: Once | Status: AC
  Administered 2013-10-16: 10:00:00
  Filled 2013-10-16: qty 1

## 2013-10-16 MED ORDER — BOOST / RESOURCE BREEZE PO LIQD
1.0000 | ORAL | Status: DC
  Administered 2013-10-16 – 2013-10-17 (×2): 1 via ORAL

## 2013-10-16 MED ORDER — GLUCERNA SHAKE PO LIQD
237.0000 mL | ORAL | Status: DC
  Administered 2013-10-16 – 2013-10-17 (×2): 237 mL via ORAL
  Filled 2013-10-16 (×3): qty 237

## 2013-10-16 NOTE — Progress Notes (Signed)
PROGRESS NOTE  Lawrence Jennings EXH:[id number] DOB: 1955/05/10 DOA: 10/14/2013 PCP: No primary provider on file. Rheumatologist Dr. Dierdre Forth  Summary: 58 year old man with new diagnosis of psoriatic arthritis presented to the emergency department with complaints of weakness generalized, rash, joint and muscle pain and failure to thrive. Initial investigation suggested failure to thrive and dehydration clinically. Plans were made for observation to assist with pain control and coordination of care.  Assessment/Plan: 1. Psoriatic arthritis with severe pain, and polyarticular involvement and failure to thrive. Improved today with steroids. Will start tapering steroids in am.  2. Dehydration with modest elevation of lactate. Improved with fluids. . 3. Hyperglycemia with, suspect diabetes mellitus, new diagnosis.: hgba1c is pending.  CBG (last 3)   Recent Labs  10/16/13 0722 10/16/13 1126 10/16/13 1636  GLUCAP 385* 251* 256*    Will add 2 units of pre meal coverage.  4. Hypokalemia, resolved with repletion. 5. Severe malnutrition: nutrition consulted and recommendations given.   Code Status: full code DVT prophylaxis: Lovenox Family Communication: none present Disposition Plan: home with [facility] PT 1-2 days  Kathlen Mody, MD  [facility]  Pager [phone] If 7PM-7AM, please contact night-coverage at [email or web address], password [facility] 10/16/2013, 5:09 PM  LOS: 2 days   Consultants:  Physical therapy, home health  Procedures:  none  Antibiotics:  none  HPI/Subjective: Feeling better today. Joints are still painful but has better range of motion. Eating well, drinking well. Able to ambulate with a cane. His main concern is inability to get medications. He reports he is not able to afford them.  We will get case manager consult in am.    Objective: Filed Vitals:   10/15/13 1400 10/15/13 2120 10/16/13 0551 10/16/13 1347  BP: 119/76 144/80 134/80 138/79  Pulse: 84 82 63 68    Temp: 98.4 F (36.9 C) 98.2 F (36.8 C) 97.3 F (36.3 C) 98.1 F (36.7 C)  TempSrc: Oral Oral Oral Oral  Resp: 18 18 18 18   Height:      Weight:      SpO2: 98% 97% 98%     Intake/Output Summary (Last 24 hours) at 10/16/13 1709 Last data filed at 10/16/13 1300  Gross per 24 hour  Intake 4678.98 ml  Output   3925 ml  Net 753.98 ml     Filed Weights   10/14/13 1317 10/14/13 1701  Weight: 73.029 kg (161 lb) 72.394 kg (159 lb 9.6 oz)    Exam:   Afebrile, vital signs stable. No hypoxia. Gen. Appears calm and comfortable. Much better appearing today. Cardiovascular. Regular rate and rhythm. No murmur, rub or gallop. No lower extremity edema. Respiratory. Clear to auscultation bilaterally. No wheezes, rales or rhonchi. Normal respiratory effort. Skin. Defuse rash somewhat improved.  Data Reviewed: I/O: -1860 Chemistry: Capillary blood sugars 200-400. Basic metabolic panel unremarkable except for hyperglycemia, hypokalemia has resolved. Heme: Hemoglobin stable, 10.5 ID: Urine culture pending  Scheduled Meds: . aspirin EC  81 mg Oral Daily  . enoxaparin (LOVENOX) injection  40 mg Subcutaneous Q24H  . feeding supplement (GLUCERNA SHAKE)  237 mL Oral Q24H  . feeding supplement (RESOURCE BREEZE)  1 Container Oral Q24H  . gemfibrozil  600 mg Oral BID AC  . insulin aspart  0-15 Units Subcutaneous TID WC  . insulin aspart  0-5 Units Subcutaneous QHS  . nystatin  5 mL Oral QID  . [START ON 10/17/2013] predniSONE  40 mg Oral QAC breakfast   Continuous Infusions: . sodium chloride 75 mL/hr at 10/16/13 1207  Principal Problem:   Psoriatic arthritis Active Problems:   Protein-calorie malnutrition, severe   Hyperglycemia   FTT (failure to thrive) in adult   Time spent 15 minutes

## 2013-10-16 NOTE — Progress Notes (Signed)
Inpatient Diabetes Program Recommendations  AACE/ADA: New Consensus Statement on Inpatient Glycemic Control (2013)  Target Ranges:  Prepandial:   less than 140 mg/dL      Peak postprandial:   less than 180 mg/dL (1-2 hours)      Critically ill patients:  140 - 180 mg/dL   Inpatient Diabetes Program Recommendations Insulin - Basal: add Lantus 10 units Correction (SSI): may need resistant scale during steroid therapy HgbA1C: =12 Will order basic inpatient education by bedside RN. Thank you  Piedad Climes BSN, RN,CDE Inpatient Diabetes Coordinator [phone] (team pager)

## 2013-10-16 NOTE — Progress Notes (Signed)
INITIAL NUTRITION ASSESSMENT  DOCUMENTATION CODES Per approved criteria  -Not Applicable   INTERVENTION: -Resource Breeze po once daily, each supplement provides 250 kcal and 9 grams of protein - Glucerna Shake po once daily, each supplement provides 220 kcal and 10 grams of protein -RD will continue to monitor  NUTRITION DIAGNOSIS: Inadequate oral intake related to weakness and FTT as evidenced by wt loss and po intake less than estimated needs.   Goal: Pt to meet >/= 90% of their estimated nutrition needs   Monitor:  Wt trends, labs, PO intake  Reason for Assessment: consult for nutrition assessment  58 y.o. male  Admitting Dx: Psoriatic arthritis  ASSESSMENT: 58 year old man with new diagnosis of psoriatic arthritis presented to the emergency department with complaints of weakness generalized, rash, joint and muscle pain and failure to thrive. Initial investigation suggested failure to thrive and dehydration clinically. Plans were made for observation to assist with pain control and coordination of care.  - Pt reports that he had lost 40 lbs within the last month and that he got down to 150 lbs. - Pt says that when he comes to the hospital and receives medication and IV fluids, that his appetite increases a lot. - Today he has eaten 100% of meals and then asked his son to bring him a sub which he as as well.  - Pt's weight has increased to 159 lbs. - Pt would like to continue to increase his weight.  - Pt's HbA1C was 12.0 which he attributes to steriod use - Pt had signs of muscle and fat wasting, but weight is increasing and po intake is good.  Height: Ht Readings from Last 1 Encounters:  10/14/13 5' 11.5" (1.816 m)    Weight: Wt Readings from Last 1 Encounters:  10/14/13 159 lb 9.6 oz (72.394 kg)    Ideal Body Weight: 76.5 kg  % Ideal Body Weight: 95%  Wt Readings from Last 10 Encounters:  10/14/13 159 lb 9.6 oz (72.394 kg)  10/08/13 161 lb 13.1 oz (73.4 kg)   10/05/13 162 lb 8 oz (73.71 kg)    Usual Body Weight: 190 lbs  % Usual Body Weight: 84%  BMI:  Body mass index is 21.95 kg/(m^2).  Estimated Nutritional Needs: Kcal: 1800-2100 Protein: 95-105 g Fluid: ~2.0 L/day  Skin: Intact  Diet Order: Carb Control  EDUCATION NEEDS: -Education needs addressed   Intake/Output Summary (Last 24 hours) at 10/16/13 1426 Last data filed at 10/16/13 1300  Gross per 24 hour  Intake 5038.98 ml  Output   5375 ml  Net -336.02 ml    Last BM: prior to admission   Labs:   Recent Labs Lab 10/11/13 0425 10/14/13 1330 10/15/13 0535  NA 138 138 137  K 3.9 3.5* 4.4  CL 101 99 102  CO2 24 25 24   BUN 16 10 15   CREATININE 0.79 0.56 0.62  CALCIUM 8.3* 8.4 8.0*  GLUCOSE 308* 189* 291*    CBG (last 3)   Recent Labs  10/15/13 2116 10/16/13 0722 10/16/13 1126  GLUCAP 398* 385* 251*    Scheduled Meds: . aspirin EC  81 mg Oral Daily  . enoxaparin (LOVENOX) injection  40 mg Subcutaneous Q24H  . gemfibrozil  600 mg Oral BID AC  . insulin aspart  0-15 Units Subcutaneous TID WC  . insulin aspart  0-5 Units Subcutaneous QHS  . nystatin  5 mL Oral QID  . [START ON 10/17/2013] predniSONE  40 mg Oral QAC breakfast  Continuous Infusions: . sodium chloride 75 mL/hr at 10/16/13 1207    Past Medical History  Diagnosis Date  . Allergic reaction   . Sinus disease   . Back pain   . Arthritis   . Edema     Past Surgical History  Procedure Laterality Date  . No past surgeries      Ebbie Latus RD, LDN

## 2013-10-16 NOTE — Progress Notes (Signed)
Occupational Therapy Evaluation Patient Details Name: Lawrence Jennings MRN: [id number] DOB: [birth year] Today's Date: 10/16/2013    History of Present Illness 58 year old man with new diagnosis of psoriatic arthritis presented to the emergency department  with complaints of weakness generalized, rash, joint and muscle pain and failure to thrive. Initial investigation suggested failure to thrive and dehydration clinically. Plans were made for observation to assist with pain control and coordination of care.   Clinical Impression   Patient with no further OT needs at this time.    Follow Up Recommendations  Other (comment) (HHPT)    Equipment Recommendations  None recommended by OT    Recommendations for Other Services       Precautions / Restrictions Restrictions Weight Bearing Restrictions: No      Mobility Bed Mobility                  Transfers                      Balance                                            ADL Overall ADL's : Modified independent                                       General ADL Comments: Patient reports he is getting up with SPC in room, going to bathroom, bathing and dressing in bathroom, and returning to bed without nursing assistance. Nurse tech confirms this. Patient also reported that he lives with his sister and her sister's adopted son. Pt reports his sister is a Ecologist" and he is concerned about his living environment. Pt reports food and dirty dishes left in kitchen for over a month without being cleaned/disposed of that grows mold, also reports not being able to move from room to room due to stuff everywhere. Patient requesting to speak to Social Work, I discussed with RN and called SW Marchelle Folks, who asked me to put order in. RN assisted me to put in SW consult.     Vision                     Perception     Praxis      Pertinent Vitals/Pain No c/o pain     Hand  Dominance     Extremity/Trunk Assessment Upper Extremity Assessment Upper Extremity Assessment: Overall WFL for tasks assessed   Lower Extremity Assessment Lower Extremity Assessment: Defer to PT evaluation       Communication Communication Communication: No difficulties   Cognition Arousal/Alertness: Awake/alert Behavior During Therapy: WFL for tasks assessed/performed Overall Cognitive Status: Within Functional Limits for tasks assessed                     General Comments       Exercises       Shoulder Instructions      Home Living Family/patient expects to be discharged to:: Private residence Living Arrangements: Alone Available Help at Discharge: Family;Available PRN/intermittently Type of Home: House Home Access: Level entry     Home Layout: One level               Home Equipment: Cane - single point  Prior Functioning/Environment Level of Independence: Independent with assistive device(s)        Comments: uses SPC    OT Diagnosis:     OT Problem List:     OT Treatment/Interventions:      OT Goals(Current goals can be found in the care plan section)    OT Frequency:     Barriers to D/C:            Co-evaluation              End of Session Nurse Communication: Other (comment) (pt request to speak to SW)  Activity Tolerance: Patient tolerated treatment well Patient left: in bed;with call bell/phone within reach   Time: 9470-9628 OT Time Calculation (min): 20 min Charges:  OT General Charges $OT Visit: 1 Procedure OT Evaluation $Initial OT Evaluation Tier I: 1 Procedure OT Treatments $Self Care/Home Management : 8-22 mins G-Codes:    Mccartney Chuba A November 05, 2013, 10:01 AM

## 2013-10-17 LAB — GLUCOSE, CAPILLARY
GLUCOSE-CAPILLARY: 347 mg/dL — AB (ref 70–99)
Glucose-Capillary: 297 mg/dL — ABNORMAL HIGH (ref 70–99)
Glucose-Capillary: 281 mg/dL — ABNORMAL HIGH (ref 70–99)
Glucose-Capillary: 201 mg/dL — ABNORMAL HIGH (ref 70–99)
Glucose-Capillary: 286 mg/dL — ABNORMAL HIGH (ref 70–99)

## 2013-10-17 LAB — HEMOGLOBIN A1C
HEMOGLOBIN A1C: 11.9 % — AB (ref <5.7)
Mean Plasma Glucose: 295 mg/dL — ABNORMAL HIGH (ref <117)

## 2013-10-17 MED ORDER — ENALAPRIL MALEATE 2.5 MG PO TABS
2.5000 mg | ORAL_TABLET | Freq: Every day | ORAL | Status: DC
  Administered 2013-10-17 – 2013-10-18 (×2): 2.5 mg via ORAL
  Filled 2013-10-17 (×2): qty 1

## 2013-10-17 MED ORDER — METFORMIN HCL 500 MG PO TABS: 500.0000 mg | ORAL_TABLET | Freq: Every day | ORAL | Status: DC

## 2013-10-17 MED ORDER — INSULIN GLARGINE 100 UNIT/ML ~~LOC~~ SOLN
8.0000 [IU] | Freq: Every day | SUBCUTANEOUS | Status: DC
  Administered 2013-10-17: 8 [IU] via SUBCUTANEOUS
  Filled 2013-10-17: qty 0.08

## 2013-10-17 NOTE — Progress Notes (Addendum)
Inpatient Diabetes Program Recommendations  AACE/ADA: New Consensus Statement on Inpatient Glycemic Control (2013)  Target Ranges:  Prepandial:   less than 140 mg/dL      Peak postprandial:   less than 180 mg/dL (1-2 hours)      Critically ill patients:  140 - 180 mg/dL   Reason for Visit: MD Consult- New onset diabetes  Diabetes history: A1C last admission 12.  No previous diagnosis of diabetes Outpatient Diabetes medications: Was supposed to take Metformin after discharge 6/9, but has not taken  Current orders for Inpatient glycemic control: Moderate correction Novolog tid with meals, HS correction scale, Novolog 2 units tid with meals  Note:  Third admission for patient so far this month.  Patient has appointment with PCP 6/17.  To follow-up at [facility] regarding orange card, possible Medicaid, etc.   Attempted to discuss new-onset diabetes with him.  Patient insistent that he never had diabetes prior to his recent hospitalizations, and that the elevations in blood glucose are from steroids.  Further states that the insulin injections will end when he goes home because he understands from his doctor that he will be discharged on metformin.  Attempted to explain his A1C result of 12 possibly indicates he has had elevated glucose for about 2-3 months.  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  Has had staff at rest home check his glucose before one time the result was around 500.  Tried to plant seed that perhaps he did have diabetes without his knowledge prior to hospitalizations.  Offered to give patient a paper regarding generic meters available at [facility] with strips at reduced cost.  States he will try to get a meter at [facility].  If unable to get there, won't be able to get because he has no money.  Suggested he let nursing staff help him give some insulin injections while here in case he needs insulin in the future.    Patient states  he has access to the internet via his phone.  Will give him a list of web sites regarding diabetes prior to his discharge for his reference.  Question if patient would meet criteria for Home Health follow-up at discharge since he is at such high risk for return admission?  Thank you.  Adrina Armijo S. Elsie Lincoln, RN, CNS, CDE Inpatient Diabetes Program, team pager [phone]  Addendum:  Placed dietitian referral requesting ideas for patient regarding easy to open, appropriate food items for home.  Also requested attempt regarding appropriate diabetes meal planning instruction if appropriate. Thank you.  Ghadeer Kastelic S. Elsie Lincoln, RN, CNS, CDE Inpatient Diabetes Program, team pager [phone]

## 2013-10-17 NOTE — Progress Notes (Signed)
Clinical Social Work Department BRIEF PSYCHOSOCIAL ASSESSMENT 10/17/2013  Patient:  Lawrence Jennings, Lawrence Jennings     Account Number:  [id number]     Admit date:  10/14/2013  Clinical Social Worker:  Earlie Server  Date/Time:  10/17/2013 04:00 PM  Referred by:  Physician  Date Referred:  10/17/2013 Referred for  Psychosocial assessment   Other Referral:   Interview type:  Patient Other interview type:    PSYCHOSOCIAL DATA Living Status:  FAMILY Admitted from facility:   Level of care:   Primary support name:  Shelia Primary support relationship to patient:  CHILD, ADULT Degree of support available:   Adequate    CURRENT CONCERNS Current Concerns  Other - See comment   Other Concerns:   Patient had questions re: DC plans and community resources    SOCIAL WORK ASSESSMENT / PLAN CSW received referral in order to complete psychosocial assessment reviewed chart and met with patient at bedside. CSW introduced myself and explained role.    Patient reports he lives at home with his sister. Sister works long hours at work and patient reports there are times when he does not even see her for a few days. Patient states that the hospital has assisted with getting [facility] and medication assistance. Patient reports that he is worried about medical conditions and possible diabetes. Patient reports at times he does not eat during the day because he does not have the energy. CSW suggested that patient inquire about Mobile Meals and gave referral to [employer] [facility] [place].    Patient thanked CSW for information and reports no further concerns. CSW is signing off but available if further needs arise.   Assessment/plan status:  Referral to [employer] Other assessment/ plan:   Information/referral to community resources:   Tax adviser    PATIENT'S/FAMILY'S RESPONSE TO PLAN OF CARE: Patient alert and oriented. Patient reports that he is worried about DC home and not  being at 100%. Patient feels that with [facility] and support from family that he will be safe at home. Patient does not want to be diabetic and reports he will be work on eating healthier at home in order to maintain his health. Patient appreciative of information and reports "I should have thought about that myself."       Sindy Messing, LCSW [phone]

## 2013-10-17 NOTE — Progress Notes (Addendum)
Nutrition Education Note  Full nutrition assessment 10/16/13.  RD consulted for nutrition education regarding diabetes.   Lab Results  Component Value Date   HGBA1C 11.9* 10/17/2013    RD provided "Carbohydrate Counting for People with Diabetes" handout from the Academy of Nutrition and Dietetics.  Along with Plate method for Diabetic diet.   Discussed different food groups and their effects on blood sugar, emphasizing carbohydrate-containing foods. Provided list of carbohydrates and recommended serving sizes of common foods.  Patient with extensive experience cooking and cooked his own meals until increased problems with the psoriatic arthritis.  Patient receives food stamps and currently does not have the strength to prepare his own meals.  Patient given resources for mobile meals by staff.  Easy meals and planning ahead when increased strength discussed.  [name] lives in the home but sister is a Chartered loss adjuster per patient and works a lot.  She does not look in on patient and is unable to handle his needs.  Friends shop for patient at times.  Discussed importance of controlled and consistent carbohydrate intake throughout the day. Provided examples of ways to balance meals/snacks and encouraged intake of high-fiber, whole grain complex carbohydrates. Teach back method used.  Patient stated not using sugar and avoiding fried foods and watching portion sizes when he found that his blood sugar was elevated several months ago.  States he tried to manage it that way for a while before seeing an MD.  Expect fair compliance due to patient's difficulty in caring for himself and limited support.  Current diet order is CHO MOD, patient is consuming approximately 100% of meals at this time. Labs and medications reviewed. No further nutrition interventions warranted at this time. RD contact information provided. If additional nutrition issues arise, please re-consult RD.  Oran Rein, RD, LDN Clinical  Inpatient Dietitian Pager:  [phone] Weekend and after hours pager:  [phone]

## 2013-10-17 NOTE — Progress Notes (Addendum)
PROGRESS NOTE  Lawrence Jennings JQB:[id number] DOB: 01/18/1956 DOA: 10/14/2013 PCP: No primary provider on file. Rheumatologist Dr. Dierdre Forth  Summary: 58 year old man with new diagnosis of psoriatic arthritis presented to the emergency department with complaints of weakness generalized, rash, joint and muscle pain and failure to thrive. Initial investigation suggested failure to thrive and dehydration clinically. Plans were made for observation to assist with pain control and coordination of care.  Assessment/Plan: 1. Psoriatic arthritis with severe pain, and polyarticular involvement and failure to thrive. Improved today with steroids. Will start tapering steroids in am.  2. Dehydration with modest elevation of lactate. Improved with fluids. . 3. Hyperglycemia with, suspect diabetes mellitus, new diagnosis.: hgba1c is 11.9. Started him on lantus 8 units qhs.  CBG (last 3)   Recent Labs  10/16/13 0722 10/16/13 1126 10/16/13 1636  GLUCAP 385* 251* 256*    Will add 2 units of pre meal coverage.  4. Hypokalemia, resolved with repletion. 5. Severe malnutrition: nutrition consulted and recommendations given.   Code Status: full code DVT prophylaxis: Lovenox Family Communication: none present Disposition Plan: home with [facility] PT 1-2 days  Kathlen Mody, MD  [facility]  Pager [phone] If 7PM-7AM, please contact night-coverage at [email or web address], password [facility] 10/17/2013, 6:22 PM  LOS: 3 days   Consultants:  Physical therapy, home health  Procedures:  none  Antibiotics:  none  HPI/Subjective: Feeling better today. Joints are still painful but has better range of motion. Eating well, drinking well. Able to ambulate with a cane. His main concern is inability to get medications. He reports he is not able to afford them.  We will get case manager consult in am.    Objective: Filed Vitals:   10/16/13 0551 10/16/13 1347 10/16/13 2052 10/17/13 0623  BP: 134/80 138/79 134/84  132/87  Pulse: 63 68 83 81  Temp: 97.3 F (36.3 C) 98.1 F (36.7 C) 97.4 F (36.3 C) 97.5 F (36.4 C)  TempSrc: Oral Oral Oral Oral  Resp: 18 18 18 18   Height:      Weight:      SpO2: 98%  98% 97%    Intake/Output Summary (Last 24 hours) at 10/17/13 1822 Last data filed at 10/17/13 1154  Gross per 24 hour  Intake 832.63 ml  Output   1900 ml  Net -1067.37 ml     Filed Weights   10/14/13 1317 10/14/13 1701  Weight: 73.029 kg (161 lb) 72.394 kg (159 lb 9.6 oz)    Exam:   Afebrile, vital signs stable. No hypoxia. Gen. Appears calm and comfortable. Much better appearing today. Cardiovascular. Regular rate and rhythm. No murmur, rub or gallop. No lower extremity edema. Respiratory. Clear to auscultation bilaterally. No wheezes, rales or rhonchi. Normal respiratory effort. Skin. Defuse rash somewhat improved.  Data Reviewed: I/O: -1860 Chemistry: Capillary blood sugars 200-400. Basic metabolic panel unremarkable except for hyperglycemia, hypokalemia has resolved. Heme: Hemoglobin stable, 10.5 ID: Urine culture pending  Scheduled Meds: . aspirin EC  81 mg Oral Daily  . enoxaparin (LOVENOX) injection  40 mg Subcutaneous Q24H  . feeding supplement (GLUCERNA SHAKE)  237 mL Oral Q24H  . feeding supplement (RESOURCE BREEZE)  1 Container Oral Q24H  . gemfibrozil  600 mg Oral BID AC  . insulin aspart  0-15 Units Subcutaneous TID WC  . insulin aspart  0-5 Units Subcutaneous QHS  . insulin aspart  2 Units Subcutaneous TID WC  . nystatin  5 mL Oral QID  . predniSONE  40 mg Oral QAC  breakfast   Continuous Infusions: . sodium chloride 75 mL/hr at 10/16/13 2139    Principal Problem:   Psoriatic arthritis Active Problems:   Protein-calorie malnutrition, severe   Hyperglycemia   FTT (failure to thrive) in adult   Time spent 15 minutes

## 2013-10-17 NOTE — Progress Notes (Signed)
Clinical Social Work  CSW attempted to meet with patient but patient currently meeting with other staff. CSW will follow up at later time.  [place], [place] [phone]

## 2013-10-17 NOTE — Care Management Note (Unsigned)
    Page 1 of 1   10/17/2013     1:21:47 PM CARE MANAGEMENT NOTE 10/17/2013  Patient:  Lawrence Jennings, Lawrence Jennings   Account Number:  [id number]  Date Initiated:  10/17/2013  Documentation initiated by:  Allene Dillon  Subjective/Objective Assessment:   58 year old male admitted with Psoriatic arthritis.     Action/Plan:   From home.   Anticipated DC Date:  10/20/2013   Anticipated DC Plan:  [place]  CM consult      Choice offered to / List presented to:             Status of service:  In process, will continue to follow Medicare Important Message given?   (If response is "NO", the following Medicare IM given date fields will be blank) Date Medicare IM given:   Date Additional Medicare IM given:    Discharge Disposition:    Per UR Regulation:  Reviewed for med. necessity/level of care/duration of stay  If discussed at [place] of Stay Meetings, dates discussed:    Comments:  10/17/13 Allene Dillon RN BSN [phone] Met with pt at bedside to discuss his affording prescriptions at d/c. Dr Karleen Hampshire informed me she would be sending him out on Metformin and Prednisone. i informed pt that both these meds are on the $4 list at [facility]. He stated that he alsready has these meds at home.  I reminded him of his appt with Dr Charlies Silvers at the [facility] on 6/17 and he stated he is aware and has it on his phone as a reminder.

## 2013-10-18 LAB — CBC
HCT: 33.4 % — ABNORMAL LOW (ref 39.0–52.0)
HEMOGLOBIN: 11.2 g/dL — AB (ref 13.0–17.0)
MCH: 28.1 pg (ref 26.0–34.0)
MCHC: 33.5 g/dL (ref 30.0–36.0)
MCV: 83.9 fL (ref 78.0–100.0)
Platelets: 201 10*3/uL (ref 150–400)
RBC: 3.98 MIL/uL — AB (ref 4.22–5.81)
RDW: 13.9 % (ref 11.5–15.5)
WBC: 5.3 10*3/uL (ref 4.0–10.5)

## 2013-10-18 LAB — GLUCOSE, CAPILLARY: Glucose-Capillary: 206 mg/dL — ABNORMAL HIGH (ref 70–99)

## 2013-10-18 MED ORDER — INSULIN ASPART 100 UNIT/ML ~~LOC~~ SOLN: 0.0000 [IU] | Freq: Three times a day (TID) | SUBCUTANEOUS | Status: DC

## 2013-10-18 MED ORDER — INSULIN GLARGINE 100 UNIT/ML ~~LOC~~ SOLN: 8.0000 [IU] | Freq: Every day | SUBCUTANEOUS | Status: DC

## 2013-10-18 MED ORDER — ENALAPRIL MALEATE 2.5 MG PO TABS: 2.5000 mg | ORAL_TABLET | Freq: Every day | ORAL | Status: DC

## 2013-10-18 MED ORDER — PREDNISONE 20 MG PO TABS: ORAL_TABLET | ORAL | Status: DC

## 2013-10-18 NOTE — Progress Notes (Signed)
Discharge instructions given to pt, verbalized understanding. Left the unit in stable condition. 

## 2013-10-18 NOTE — Progress Notes (Signed)
Inpatient Diabetes Program Recommendations  AACE/ADA: New Consensus Statement on Inpatient Glycemic Control (2013)  Target Ranges:  Prepandial:   less than 140 mg/dL      Peak postprandial:   less than 180 mg/dL (1-2 hours)      Critically ill patients:  140 - 180 mg/dL   Note:  Returned to talk with patient again today regarding diabetes.  Gave him some websites he can access from his phone regarding diabetes information.  "I'm really hoping I don't have to deal with the diabetes.  I didn't have it before I came in."  Also gave him the booklet "Where Do I Start" from the ADA.  Nurse came in with prescriptions-- including insulin Rx.  Patient does not want to take insulin.  To discuss insulin with his PCP at appointment later this week.  "If he tells me to do it, I will do it.  Not now."  Encouraged patient to keep his follow-up appointment and go get the orange card at [facility].  Encouraged patient to talk to his PCP regarding outpatient education follow-up- as he is refusing this now as well.  Patti S. Elsie Lincoln, RN, CNS, CDE Inpatient Diabetes Program, team pager [phone]

## 2013-10-19 ENCOUNTER — Inpatient Hospital Stay: Payer: Self-pay

## 2013-10-19 ENCOUNTER — Telehealth: Payer: Self-pay | Admitting: General Practice

## 2013-10-19 NOTE — Telephone Encounter (Signed)
Left voicemail for patient to call to schedule appointment to establish care. °

## 2013-10-20 ENCOUNTER — Inpatient Hospital Stay: Payer: Self-pay | Admitting: Internal Medicine

## 2013-10-21 NOTE — Discharge Summary (Signed)
Physician Discharge Summary  Lawrence Jennings UXN:[id number] DOB: 01-19-1956 DOA: 10/14/2013  PCP: No primary [facility] on file.  Admit date: 10/14/2013 Discharge date: 10/21/2013  Time spent: 30 minutes  Recommendations for Outpatient Follow-up:  Follow up with PCP in 1 to 2 days.  You have been diagnosed with diabetes Mellitus and you need closer follow up with PCP Please follow up with rheumatologist as recommended.  Discharge Diagnoses:  Principal Problem:   Psoriatic arthritis Active Problems:   Protein-calorie malnutrition, severe   Hyperglycemia   FTT (failure to thrive) in adult new diagnosis of DM.   Discharge Condition: slightly improved.   Diet recommendation: carb modified diet.   Filed Weights   10/14/13 1317 10/14/13 1701  Weight: 73.029 kg (161 lb) 72.394 kg (159 lb 9.6 oz)    History of present illness:  58 year old man with new diagnosis of psoriatic arthritis presented to the emergency department with complaints of weakness generalized, rash, joint and muscle pain and failure to thrive. Initial investigation suggested failure to thrive and dehydration clinically. Plans were made for observation to assist with pain control and coordination of care. He was also  Found to have exacerbation of psoriatic arthritis. He was starte don steroids and his symptoms improved much. He wanted to be discharged as soon as possible. He was also diagnosed with Dm with hgba1c of 11.9. But he refused to be oninsulin and wishes to be on metformin only.      Hospital Course:  1. Psoriatic arthritis with severe pain, and polyarticular involvement and failure to thrive. Improved today with steroids. We have started tapering steroids and discharged him on steroids with a quick taper. Recommended to follow up with rheumatologist in one week.  2. Dehydration with modest elevation of lactate. Improved with fluids. . 3. Hyperglycemia with, suspect diabetes mellitus, new diagnosis.: hgba1c is  11.9. Started him on lantus 8 units qhs.  CBG (last 3)   Recent Labs   10/16/13 0722  10/16/13 1126  10/16/13 1636   GLUCAP  385*  251*  256*    Patient refused to take insulin and said he does not have DM and wishes to take metformin only.  4. Hypokalemia, resolved with repletion. 5. Severe malnutrition: nutrition consulted and recommendations given.    Procedures:  none  Consultations:  none  Discharge Exam: Filed Vitals:   10/18/13 0537  BP: 120/80  Pulse: 70  Temp: 97.7 F (36.5 C)  Resp: 18    General: alert afebrile comfortable Cardiovascular: s1s2 Respiratory: ctab  Discharge Instructions You were cared for by a hospitalist during your hospital stay. If you have any questions about your discharge medications or the care you received while you were in the hospital after you are discharged, you can call the unit and asked to speak with the hospitalist on call if the hospitalist that took care of you is not available. Once you are discharged, your primary care physician will handle any further medical issues. Please note that NO REFILLS for any discharge medications will be authorized once you are discharged, as it is imperative that you return to your primary care physician (or establish a relationship with a primary care physician if you do not have one) for your aftercare needs so that they can reassess your need for medications and monitor your lab values.  Discharge Instructions   Diet Carb Modified    Complete by:  As directed      Discharge instructions    Complete by:  As  directed   Follow up with PCP tomorrow.  Follow up with rheumatology as recommended.            Medication List         aspirin 81 MG EC tablet  Take 1 tablet (81 mg total) by mouth daily.     benzonatate 100 MG capsule  Commonly known as:  TESSALON  Take 1 capsule (100 mg total) by mouth 3 (three) times daily as needed for cough.     enalapril 2.5 MG tablet  Commonly known  as:  VASOTEC  Take 1 tablet (2.5 mg total) by mouth daily.     gemfibrozil 600 MG tablet  Commonly known as:  LOPID  Take 1 tablet (600 mg total) by mouth 2 (two) times daily before a meal.     guaiFENesin-codeine 100-10 MG/5ML syrup  Take 5 mLs by mouth every 6 (six) hours as needed for cough.     insulin aspart 100 UNIT/ML injection  Commonly known as:  novoLOG  Inject 0-15 Units into the skin 3 (three) times daily with meals.     insulin glargine 100 UNIT/ML injection  Commonly known as:  LANTUS  Inject 0.08 mLs (8 Units total) into the skin at bedtime.     metFORMIN 500 MG tablet  Commonly known as:  GLUCOPHAGE  Take 1 tablet (500 mg total) by mouth daily with breakfast.     oxyCODONE-acetaminophen 5-325 MG per tablet  Commonly known as:  PERCOCET/ROXICET  Take 1 tablet by mouth every 6 (six) hours as needed for severe pain.     predniSONE 20 MG tablet  Commonly known as:  DELTASONE  - Prednisone 20 mg daily for 2 days followed by   - Prednisone 10 mg daily for 3 days.       No Known Allergies     Follow-up Information   Call [facility]. (Call for questions about Mobile Meals)    Contact information:   [address] [place], [place] [address]  Telephone: [phone]         The results of significant diagnostics from this hospitalization (including imaging, microbiology, ancillary and laboratory) are listed below for reference.    Significant Diagnostic Studies: Dg Neck Soft Tissue  10/04/2013   CLINICAL DATA:  Neck pain fever  EXAM: NECK SOFT TISSUES - 1+ VIEW  COMPARISON:  None.  FINDINGS: There is no evidence of retropharyngeal soft tissue swelling or epiglottic enlargement. The cervical airway is unremarkable and no radio-opaque foreign body identified.  IMPRESSION: Negative.   Electronically Signed   By: Salome Holmes M.D.   On: 10/04/2013 10:30   Dg Chest 2 View  10/14/2013   CLINICAL DATA:  Chest pain, shortness of breath,  cough for 2 days, former smoker  EXAM: CHEST  2 VIEW  COMPARISON:  10/10/2013  FINDINGS: Normal heart size, mediastinal contours, and pulmonary vascularity.  Lungs hyperinflated with persistent accentuation of RIGHT basilar markings question infiltrate.  Minimal atelectasis at LEFT costophrenic angle.  Remaining lungs clear.  No pleural effusion or pneumothorax.  Few scattered endplate spurs lower thoracic spine.  IMPRESSION: Minimal persistent infiltrate at RIGHT lung base similar to 10/10/2013.  Minimal LEFT base atelectasis.   Electronically Signed   By: Ulyses Southward M.D.   On: 10/14/2013 14:10   Dg Chest 2 View  10/10/2013   CLINICAL DATA:  Followup airspace disease versus pneumonia  EXAM: CHEST  2 VIEW  COMPARISON:  10/08/2013  FINDINGS: Interval  improvement in right lower lobe infiltrate which has nearly completely cleared and is most consistent with resolving pneumonia. No effusion. Negative for heart failure. Left lung remains clear.  IMPRESSION: Near complete resolution of right lower lobe infiltrate.   Electronically Signed   By: Marlan Palau M.D.   On: 10/10/2013 08:51   Dg Chest 2 View  10/08/2013   CLINICAL DATA:  Short of breath  EXAM: CHEST  2 VIEW  COMPARISON:  Prior chest x-ray 10/04/2013  FINDINGS: Stable cardiac and mediastinal contours. Trace atherosclerotic calcification in the transverse aorta. Patchy airspace opacities in the right lower lobe conspicuous only on the frontal view. Otherwise, the lungs are clear. Mild bronchitic changes and chronic lung disease unchanged.  IMPRESSION: 1. Patchy airspace opacities in the right lower lobe seen only in the frontal view may reflect very early infiltrate in the setting of bronchopneumonia, or perhaps subsegmental atelectasis.   Electronically Signed   By: Malachy Moan M.D.   On: 10/08/2013 13:29   Dg Chest 2 View  10/04/2013   CLINICAL DATA:  Rash and cough.  EXAM: CHEST - 2 VIEW  COMPARISON:  None  FINDINGS: The heart size and  mediastinal contours are within normal limits. Mild pulmonary interstitial prominence and scarring bilaterally is likely consistent with chronic disease. There is no evidence of pulmonary edema, consolidation, pneumothorax, nodule or pleural fluid. The visualized skeletal structures are unremarkable.  IMPRESSION: No active disease.  Probable mild chronic lung disease.   Electronically Signed   By: Irish Lack M.D.   On: 10/04/2013 10:25   Dg Wrist Complete Left  10/08/2013   CLINICAL DATA:  Left wrist pain and swelling.  EXAM: LEFT WRIST - COMPLETE 3+ VIEW  COMPARISON:  None.  FINDINGS: There is no evidence of fracture or dislocation. There is no evidence of arthropathy or other focal bone abnormality. Soft tissues are unremarkable.  IMPRESSION: Negative.   Electronically Signed   By: Myles Rosenthal M.D.   On: 10/08/2013 13:34   Ct Head Wo Contrast  10/10/2013   CLINICAL DATA:  History of lower extremity weakness.  Possible CVA.  EXAM: CT HEAD WITHOUT CONTRAST  TECHNIQUE: Contiguous axial images were obtained from the base of the skull through the vertex without contrast.  COMPARISON:  10/08/2013  FINDINGS: No evidence for acute hemorrhage, mass lesion, midline shift, hydrocephalus or large infarct. Again noted are subtle low-density areas near the anterior limb of the right internal capsule. These findings are similar to the previous examination. There is extensive mucosal disease in the maxillary sinuses bilaterally and cannot exclude fluid within the maxillary sinuses. Right maxillary sinus walls are thickened and suggest chronic changes. There is diffuse mucosal disease in the right frontal sinus. No acute bone abnormality.  IMPRESSION: No acute intracranial abnormality.  Subtle low-density areas along the right internal capsule anterior limb. These findings are nonspecific but could represent lacunar disease of unknown age.  Mucosal disease in the maxillary sinuses and right frontal sinus.   Electronically  Signed   By: Richarda Overlie M.D.   On: 10/10/2013 14:58   Ct Head Wo Contrast  10/08/2013   CLINICAL DATA:  Lower extremity weakness.  EXAM: CT HEAD WITHOUT CONTRAST  TECHNIQUE: Contiguous axial images were obtained from the base of the skull through the vertex without intravenous contrast.  COMPARISON:  No priors.  FINDINGS: Slightly ill-defined area of low attenuation in the anterior limb of the right internal capsule (images 17 and 18 of series 2) could  represent age-indeterminate ischemia. No other acute intracranial abnormalities. Specifically, no evidence of mass, mass effect, hydrocephalus or abnormal intra or extra-axial fluid collections. No acute displaced skull fractures are identified. Mastoids are well pneumatized bilaterally. There is complete opacification of the right frontal sinus and near complete opacification of the maxillary sinuses bilaterally, with some mild multifocal mucosal thickening throughout the ethmoid sinuses bilaterally.  IMPRESSION: 1. Tiny ill-defined focus of low attenuation in the anterior limb of the right internal capsule could represent age-indeterminate (potentially subacute) ischemia. This could be better evaluated with MRI of the brain if clinically indicated. 2. Extensive paranasal sinus disease, as above. Given the mucoperiosteal thickening, of majority of this is favored to represent chronic disease, however, clinical correlation for signs and symptoms of acute sinusitis is recommended. These results were called by telephone at the time of interpretation on 10/08/2013 at 1:35 PM to Dr. Randa Spike, [place] Apple, who verbally acknowledged these results.   Electronically Signed   By: Trudie Reed M.D.   On: 10/08/2013 13:39    Microbiology: Recent Results (from the past 240 hour(s))  URINE CULTURE     Status: None   Collection Time    10/14/13  1:53 PM      Result Value Ref Range Status   Specimen Description URINE, CLEAN CATCH   Final   Special Requests NONE   Final    Culture  Setup Time     Final   Value: 10/14/2013 22:35     Performed at [facility] Count     Final   Value: NO GROWTH     Performed at [facility]   Culture     Final   Value: NO GROWTH     Performed at [facility]   Report Status 10/15/2013 FINAL   Final     Labs: Basic Metabolic Panel:  Recent Labs Lab 10/14/13 1330 10/15/13 0535  NA 138 137  K 3.5* 4.4  CL 99 102  CO2 25 24  GLUCOSE 189* 291*  BUN 10 15  CREATININE 0.56 0.62  CALCIUM 8.4 8.0*   Liver Function Tests:  Recent Labs Lab 10/14/13 1330  AST 64*  ALT 49  ALKPHOS 76  BILITOT 0.5  PROT 6.9  ALBUMIN 2.1*   No results found for this basename: LIPASE, AMYLASE,  in the last 168 hours No results found for this basename: AMMONIA,  in the last 168 hours CBC:  Recent Labs Lab 10/14/13 1330 10/15/13 0535 10/18/13 0516  WBC 6.4 3.1* 5.3  NEUTROABS 4.8  --   --   HGB 12.7* 10.5* 11.2*  HCT 37.2* 31.1* 33.4*  MCV 82.5 84.5 83.9  PLT 255 216 201   Cardiac Enzymes:  Recent Labs Lab 10/14/13 1330  TROPONINI <0.30   BNP: BNP (last 3 results)  Recent Labs  10/08/13 1313 10/14/13 1330  PROBNP 206.1* 169.4*   CBG:  Recent Labs Lab 10/17/13 0803 10/17/13 1152 10/17/13 1656 10/17/13 2132 10/18/13 0733  GLUCAP 281* 201* 286* 347* 206*       Signed:  AKULA,VIJAYA  [facility] 10/21/2013, 12:35 AM

## 2013-10-24 ENCOUNTER — Encounter: Payer: Self-pay | Admitting: Internal Medicine

## 2013-10-24 ENCOUNTER — Ambulatory Visit: Payer: Medicaid Other | Attending: Internal Medicine | Admitting: Internal Medicine

## 2013-10-24 VITALS — BP 103/68 | HR 112 | Temp 98.3°F | Resp 22 | Ht 71.5 in | Wt 161.0 lb

## 2013-10-24 DIAGNOSIS — R739 Hyperglycemia, unspecified: Secondary | ICD-10-CM

## 2013-10-24 DIAGNOSIS — L409 Psoriasis, unspecified: Secondary | ICD-10-CM | POA: Insufficient documentation

## 2013-10-24 DIAGNOSIS — L408 Other psoriasis: Secondary | ICD-10-CM

## 2013-10-24 DIAGNOSIS — Z87891 Personal history of nicotine dependence: Secondary | ICD-10-CM | POA: Insufficient documentation

## 2013-10-24 DIAGNOSIS — R7309 Other abnormal glucose: Secondary | ICD-10-CM | POA: Diagnosis not present

## 2013-10-24 DIAGNOSIS — L4050 Arthropathic psoriasis, unspecified: Secondary | ICD-10-CM | POA: Diagnosis not present

## 2013-10-24 DIAGNOSIS — R21 Rash and other nonspecific skin eruption: Secondary | ICD-10-CM | POA: Diagnosis not present

## 2013-10-24 LAB — GLUCOSE, POCT (MANUAL RESULT ENTRY)
POC GLUCOSE: 511 mg/dL — AB (ref 70–99)
POC Glucose: 506 mg/dl — AB (ref 70–99)
POC Glucose: 363 mg/dl — AB (ref 70–99)

## 2013-10-24 MED ORDER — INSULIN ASPART 100 UNIT/ML ~~LOC~~ SOLN
20.0000 [IU] | Freq: Once | SUBCUTANEOUS | Status: AC
  Administered 2013-10-24: 20 [IU] via SUBCUTANEOUS

## 2013-10-24 MED ORDER — CLOBETASOL PROPIONATE 0.05 % EX CREA: 1.0000 "application " | TOPICAL_CREAM | Freq: Two times a day (BID) | CUTANEOUS | Status: DC

## 2013-10-24 MED ORDER — SODIUM CHLORIDE 0.9 % IV SOLN
Freq: Once | INTRAVENOUS | Status: AC
  Administered 2013-10-24: 1000 mL via INTRAVENOUS

## 2013-10-24 MED ORDER — ENALAPRIL MALEATE 2.5 MG PO TABS: 2.5000 mg | ORAL_TABLET | Freq: Every day | ORAL | Status: DC

## 2013-10-24 MED ORDER — INSULIN ASPART 100 UNIT/ML ~~LOC~~ SOLN: 0.0000 [IU] | Freq: Three times a day (TID) | SUBCUTANEOUS | Status: DC

## 2013-10-24 MED ORDER — TRAMADOL HCL 50 MG PO TABS: 50.0000 mg | ORAL_TABLET | Freq: Three times a day (TID) | ORAL | Status: DC | PRN

## 2013-10-24 MED ORDER — FREESTYLE SYSTEM KIT: 1.0000 | PACK | Status: DC | PRN

## 2013-10-24 NOTE — Addendum Note (Signed)
Addended by: Fredderick Severance on: 10/24/2013 02:47 PM   Modules accepted: Orders

## 2013-10-24 NOTE — Addendum Note (Signed)
Addended by: Jeanann Lewandowsky E on: 10/24/2013 01:36 PM   Modules accepted: Orders

## 2013-10-24 NOTE — Progress Notes (Addendum)
Patient presents to establish care following hospitalization for new dx of psoriatic arthritis Patient states he lives with his sister who is a Chart[employer] loss adjuster which has made his living conditions very difficult. Pt referred to SW to discuss.  Patient instructed on CBG testing along with sliding scale and insulin administration.

## 2013-10-24 NOTE — Patient Instructions (Addendum)
CBG 70 - 120: 0 units  CBG 121 - 150: 2 units  CBG 151 - 200: 3 units  CBG 201 - 250: 5 units  CBG 251 - 300: 8 units  CBG 301 - 350: 11 units  CBG 351 - 400: 15 units  Before a meal (preprandial plasma glucose): 70-130   1-2 hours after beginning of the meal (Postprandial plasma glucose): Less than 180   See more at: www.diabetes.org/living-with-diabetes/treatment-and-care/blood-glucose-control/checking-your-blood-glucose.htm    Psoriasis Psoriasis is a common, long-lasting (chronic) inflammation of the skin. It affects both men and women equally, of all ages and all races. Psoriasis cannot be passed from person to person (not contagious). Psoriasis varies from mild to very severe. When severe, it can greatly affect your quality of life. Psoriasis is an inflammatory disorder affecting the skin as well as other organs including the joints (causing an arthritis). With psoriasis, the skin sheds its top layer of cells more rapidly than it does in someone without psoriasis. CAUSES  The cause of psoriasis is largely unknown. Genetics, your immune system, and the environment seem to play a role in causing psoriasis. Factors that can make psoriasis worse include:  Damage or trauma to the skin, such as cuts, scrapes, and sunburn. This damage often causes new areas of psoriasis (lesions).  Winter dryness and lack of sunlight.  Medicines such as lithium, beta-blockers, antimalarial drugs, ACE inhibitors, nonsteroidal anti-inflammatory drugs (ibuprofen, aspirin), and terbinafine. Let your caregiver know if you are taking any of these drugs.  Alcohol. Excessive alcohol use should be avoided if you have psoriasis. Drinking large amounts of alcohol can affect:  How well your psoriasis treatment works.  How safe your psoriasis treatment is.  Smoking. If you smoke, ask your caregiver for help to quit.  Stress.  Bacterial or viral infections.  Arthritis. Arthritis associated with psoriasis  (psoriatic arthritis) affects less than 10% of patients with psoriasis. The arthritic intensity does not always match the skin psoriasis intensity. It is important to let your caregiver know if your joints hurt or if they are stiff. SYMPTOMS  The most common form of psoriasis begins with little red bumps that gradually become larger. The bumps begin to form scales that flake off easily. The lower layers of scales stick together. When these scales are scratched or removed, the underlying skin is tender and bleeds easily. These areas then grow in size and may become large. Psoriasis often creates a rash that looks the same on both sides of the body (symmetrical). It often affects the elbows, knees, groin, genitals, arms, legs, scalp, and nails. Affected nails often have pitting, loosen, thicken, crumble, and are difficult to treat.  "Inverse psoriasis"occurs in the armpits, under breasts, in skin folds, and around the groin, buttocks, and genitals.  "Guttate psoriasis" generally occurs in children and young adults following a recent sore throat (strep throat). It begins with many small, red, scaly spots on the skin. It clears spontaneously in weeks or a few months without treatment. DIAGNOSIS  Psoriasis is diagnosed by physical exam. A tissue sample (biopsy) may also be taken. TREATMENT The treatment of psoriasis depends on your age, health, and living conditions.  Steroid (cortisone) creams, lotions, and ointments may be used. These treatments are associated with thinning of the skin, blood vessels that get larger (dilated), loss of skin pigmentation, and easy bruising. It is important to use these steroids as directed by your caregiver. Only treat the affected areas and not the normal, unaffected skin. People on long-term  steroid treatment should wear a medical alert bracelet. Injections may be used in areas that are difficult to treat.  Scalp treatments are available as shampoos, solutions, sprays,  foams, and oils. Avoid scratching the scalp and picking at the scales.  Anthralin medicine works well on areas that are difficult to treat. However, it stains clothes and skin and may cause temporary irritation.  Synthetic vitamin D (calcipotriene)can be used on small areas. It is available by prescription. The forms of synthetic vitamin D available in health food stores do not help with psoriasis.  Coal tarsare available in various strengths for psoriasis that is difficult to treat. They are one of the longest used treatments for difficult to treat psoriasis. However, they are messy to use.  Light therapy (UV therapy) can be carefully and professionally monitored in a dermatologist's office. Careful sunbathing is helpful for many people as directed by your caregiver. The exposure should be just long enough to cause a mild redness (erythema) of your skin. Avoid sunburn as this may make the condition worse. Sunscreen (SPF of 30 or higher) should be used to protect against sunburn. Cataracts, wrinkles, and skin aging are some of the harmful side effects of light therapy.  If creams (topical medicines) fail, there are several other options for systemic or oral medicines your caregiver can suggest. Psoriasis can sometimes be very difficult to treat. It can come and go. It is necessary to follow up with your caregiver regularly if your psoriasis is difficult to treat. Usually, with persistence you can get a good amount of relief. Maintaining consistent care is important. Do not change caregivers just because you do not see immediate results. It may take several trials to find the right combination of treatment for you. PREVENTING FLARE-UPS  Wear gloves while you wash dishes, while cleaning, and when you are outside in the cold.  If you have radiators, place a bowl of water or damp towel on the radiator. This will help put water back in the air. You can also use a humidifier to keep the air moist. Try to  keep the humidity at about 60% in your home.  Apply moisturizer while your skin is still damp from bathing or showering. This traps water in the skin.  Avoid long, hot baths or showers. Keep soap use to a minimum. Soaps dry out the skin and wash away the protective oils. Use a fragrance free, dye free soap.  Drink enough water and fluids to keep your urine clear or pale yellow. Not drinking enough water depletes your skin's water supply.  Turn off the heat at night and keep it low during the day. Cool air is less drying. SEEK MEDICAL CARE IF:  You have increasing pain in the affected areas.  You have uncontrolled bleeding in the affected areas.  You have increasing redness or warmth in the affected areas.  You start to have pain or stiffness in your joints.  You start feeling depressed about your condition.  You have a fever. Document Released: 04/18/2000 Document Revised: 07/14/2011 Document Reviewed: 10/14/2010 [facility] Patient Information 2015 [place], [place]. This information is not intended to replace advice given to you by your health care provider. Make sure you discuss any questions you have with your health care provider.

## 2013-10-24 NOTE — Addendum Note (Signed)
Addended by: Fredderick Severance on: 10/24/2013 12:50 PM   Modules accepted: Orders

## 2013-10-24 NOTE — Addendum Note (Signed)
Addended by: Alison Murray on: 10/24/2013 12:37 PM   Modules accepted: Orders

## 2013-10-24 NOTE — Progress Notes (Signed)
Patient ID: Lawrence Jennings, male   DOB: [birth year], 57 y.o.   MRN: [id number]  CC: follow up  HPI: 58 year old male with recent hospitalization for widespread generalized rash involving trunk, arms, legs, face. Patient had an extensive workup to evaluate this rash and based on the last surgical pathology this looks like psoriatic dermatitis. Patient has been taking prednisone which he reports it helps significantly. No complaints of bleeding from rash. No other problems.  No Known Allergies Past Medical History  Diagnosis Date  . Allergic reaction   . Sinus disease   . Back pain   . Arthritis   . Edema    Current Outpatient Prescriptions on File Prior to Visit  Medication Sig Dispense Refill  . aspirin EC 81 MG EC tablet Take 1 tablet (81 mg total) by mouth daily.  30 tablet  3  . benzonatate (TESSALON) 100 MG capsule Take 1 capsule (100 mg total) by mouth 3 (three) times daily as needed for cough.  60 capsule  0  . gemfibrozil (LOPID) 600 MG tablet Take 1 tablet (600 mg total) by mouth 2 (two) times daily before a meal.  60 tablet  3  . guaiFENesin-codeine 100-10 MG/5ML syrup Take 5 mLs by mouth every 6 (six) hours as needed for cough.  120 mL  0  . insulin glargine (LANTUS) 100 UNIT/ML injection Inject 0.08 mLs (8 Units total) into the skin at bedtime.  10 mL  2  . metFORMIN (GLUCOPHAGE) 500 MG tablet Take 1 tablet (500 mg total) by mouth daily with breakfast.  30 tablet  3  . oxyCODONE-acetaminophen (PERCOCET/ROXICET) 5-325 MG per tablet Take 1 tablet by mouth every 6 (six) hours as needed for severe pain.  30 tablet  0  . predniSONE (DELTASONE) 20 MG tablet Prednisone 20 mg daily for 2 days followed by  Prednisone 10 mg daily for 3 days.  4 tablet  0   No current facility-administered medications on file prior to visit.   HTN in family.  History   Social History  . Marital Status: Single    Spouse Name: N/A    Number of Children: N/A  . Years of Education: N/A    Occupational History  . Not on file.   Social History Main Topics  . Smoking status: Former Smoker -- 0.25 packs/day for 20 years    Types: Cigarettes    Quit date: 09/13/2013  . Smokeless tobacco: Never Used  . Alcohol Use: No  . Drug Use: No  . Sexual Activity: No   Other Topics Concern  . Not on file   Social History Narrative  . No narrative on file    Review of Systems  Constitutional: Negative for fever, chills, diaphoresis, activity change, appetite change and fatigue.  HENT: Negative for ear pain, nosebleeds, congestion, facial swelling, rhinorrhea, neck pain, neck stiffness and ear discharge.   Eyes: Negative for pain, discharge, redness, itching and visual disturbance.  Respiratory: Negative for cough, choking, chest tightness, shortness of breath, wheezing and stridor.   Cardiovascular: Negative for chest pain, palpitations and leg swelling.  Gastrointestinal: Negative for abdominal distention.  Genitourinary: Negative for dysuria, urgency, frequency, hematuria, flank pain, decreased urine volume, difficulty urinating and dyspareunia.  Musculoskeletal: Negative for back pain, joint swelling, arthralgias and gait problem.  Neurological: Negative for dizziness, tremors, seizures, syncope, facial asymmetry, speech difficulty, weakness, light-headedness, numbness and headaches.  Hematological: Negative for adenopathy. Does not bruise/bleed easily.  Psychiatric/Behavioral: Negative for hallucinations, behavioral problems, confusion,  dysphoric mood, decreased concentration and agitation.    Objective:   Filed Vitals:   10/24/13 1146  BP: 103/68  Pulse: 112  Temp: 98.3 F (36.8 C)  Resp: 22    Physical Exam  Constitutional: Appears well-developed and well-nourished. No distress.  HENT: Normocephalic. External right and left ear normal. Oropharynx is clear and moist.  Eyes: Conjunctivae and EOM are normal. PERRLA, no scleral icterus.  Neck: Normal ROM. Neck  supple. No JVD. No tracheal deviation. No thyromegaly.  CVS: RRR, S1/S2 +, no murmurs, no gallops, no carotid bruit.  Pulmonary: Effort and breath sounds normal, no stridor, rhonchi, wheezes, rales.  Abdominal: Soft. BS +,  no distension, tenderness, rebound or guarding.  Musculoskeletal: Normal range of motion. No edema and no tenderness.  Lymphadenopathy: No lymphadenopathy noted, cervical, inguinal. Neuro: Alert. Normal reflexes, muscle tone coordination. No cranial nerve deficit. Skin: Skin is warm and dry. Diffuse rash on face which seem s to be improving since hospitalization, rash is now redness with some scaling  Psychiatric: Normal mood and affect. Behavior, judgment, thought content normal.   Lab Results  Component Value Date   WBC 5.3 10/18/2013   HGB 11.2* 10/18/2013   HCT 33.4* 10/18/2013   MCV 83.9 10/18/2013   PLT 201 10/18/2013   Lab Results  Component Value Date   CREATININE 0.62 10/15/2013   BUN 15 10/15/2013   NA 137 10/15/2013   K 4.4 10/15/2013   CL 102 10/15/2013   CO2 24 10/15/2013    Lab Results  Component Value Date   HGBA1C 11.9* 10/17/2013   Lipid Panel     Component Value Date/Time   CHOL 172 10/09/2013 1215   TRIG 521* 10/09/2013 1215   HDL 23* 10/09/2013 1215   CHOLHDL 7.5 10/09/2013 1215   VLDL UNABLE TO CALCULATE IF TRIGLYCERIDE OVER 400 mg/dL 05/12/8414 6063   LDLCALC UNABLE TO CALCULATE IF TRIGLYCERIDE OVER 400 mg/dL 0/05/6008 9323       Assessment and plan:   Patient Active Problem List   Diagnosis Date Noted  . 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 Patient is still on prednisone and is seen rheumatologist - Patient was instructed to come back in one week to make sure he is a CBGs are better considering he is on steroids   . Psoriatic arthritis - Patient will followup with rheumatology per scheduled appointment. Prescription provided for Ultram for pain  control  10/14/2013  . Hyperglycemia - steroid induced - given sample of novolog sliding scale  - Patient instructed to come back in one week so we can recheck CBG.  10/11/2013

## 2013-10-24 NOTE — Addendum Note (Signed)
Addended by: Fredderick Severance on: 10/24/2013 01:40 PM   Modules accepted: Orders

## 2013-10-31 ENCOUNTER — Other Ambulatory Visit: Payer: Self-pay

## 2013-10-31 ENCOUNTER — Ambulatory Visit: Payer: Medicaid Other | Attending: Internal Medicine

## 2013-10-31 DIAGNOSIS — L409 Psoriasis, unspecified: Secondary | ICD-10-CM

## 2013-10-31 DIAGNOSIS — R739 Hyperglycemia, unspecified: Secondary | ICD-10-CM

## 2013-10-31 LAB — POCT URINALYSIS DIPSTICK
Bilirubin, UA: NEGATIVE
GLUCOSE UA: 500
KETONES UA: NEGATIVE
LEUKOCYTES UA: NEGATIVE
Nitrite, UA: NEGATIVE
Protein, UA: 100
Spec Grav, UA: 1.015
Urobilinogen, UA: 1
pH, UA: 6.5

## 2013-10-31 LAB — GLUCOSE, POCT (MANUAL RESULT ENTRY)
POC GLUCOSE: 408 mg/dL — AB (ref 70–99)
POC Glucose: 255 mg/dl — AB (ref 70–99)

## 2013-10-31 MED ORDER — SODIUM CHLORIDE 0.9 % IJ SOLN
999.0000 mL | Freq: Once | INTRAMUSCULAR | Status: AC
  Administered 2013-10-31: 999 mL via INTRAVENOUS

## 2013-10-31 MED ORDER — INSULIN REGULAR HUMAN 100 UNIT/ML IJ SOLN
20.0000 [IU] | Freq: Once | INTRAMUSCULAR | Status: AC
  Administered 2013-10-31: 20 [IU] via SUBCUTANEOUS

## 2013-10-31 MED ORDER — INSULIN GLARGINE 100 UNIT/ML ~~LOC~~ SOLN: 8.0000 [IU] | Freq: Every day | SUBCUTANEOUS | Status: DC

## 2013-10-31 MED ORDER — METFORMIN HCL 500 MG PO TABS: 500.0000 mg | ORAL_TABLET | Freq: Every day | ORAL | Status: AC

## 2013-10-31 MED ORDER — LIDOCAINE VISCOUS 2 % MT SOLN: 20.0000 mL | OROMUCOSAL | Status: DC | PRN

## 2013-10-31 MED ORDER — INSULIN ASPART 100 UNIT/ML ~~LOC~~ SOLN
0.0000 [IU] | Freq: Three times a day (TID) | SUBCUTANEOUS | Status: AC
Start: 2013-10-31 — End: ?

## 2013-10-31 NOTE — Progress Notes (Unsigned)
Patient in today for CBG check. Patient states he has been using 15 units of insulin on the sliding scale three times a day. Blood Sugar is 408 today. 20 units of Novolog Insulin administered per protocol. Per Dr. Hyman Hopes verbal order given for IV fluids. Patient also requesting refill on Metfromin, Novolog, and Lantus.

## 2013-10-31 NOTE — Progress Notes (Signed)
20 gauze peripheal inserted via left arm without diff per Dr. Hyman Hopes. Iv infusing without diff

## 2013-11-03 ENCOUNTER — Other Ambulatory Visit: Payer: Self-pay

## 2013-11-03 ENCOUNTER — Telehealth: Payer: Self-pay | Admitting: *Deleted

## 2013-11-03 NOTE — Telephone Encounter (Signed)
Patient call stating he was seen in office on 10/31/2013 and prescribed Lidocaine 2% solution. Patient states his throat is worse and he is unable to swallow his saliva. Informed patient he needs to go to ED immediatly. Patient verbalized understanding. Annamaria Helling, RN

## 2013-11-04 ENCOUNTER — Emergency Department (HOSPITAL_COMMUNITY): Payer: Medicaid Other

## 2013-11-04 ENCOUNTER — Encounter (HOSPITAL_COMMUNITY): Payer: Self-pay | Admitting: Emergency Medicine

## 2013-11-04 ENCOUNTER — Inpatient Hospital Stay (HOSPITAL_COMMUNITY)
Admission: EM | Admit: 2013-11-04 | Discharge: 2013-11-08 | DRG: 391 | Disposition: A | Discharge status: Home / Self Care | Payer: Medicaid Other | Attending: Internal Medicine | Admitting: Internal Medicine

## 2013-11-04 DIAGNOSIS — I10 Essential (primary) hypertension: Secondary | ICD-10-CM | POA: Diagnosis present

## 2013-11-04 DIAGNOSIS — R627 Adult failure to thrive: Secondary | ICD-10-CM | POA: Diagnosis present

## 2013-11-04 DIAGNOSIS — L409 Psoriasis, unspecified: Secondary | ICD-10-CM | POA: Diagnosis present

## 2013-11-04 DIAGNOSIS — Z794 Long term (current) use of insulin: Secondary | ICD-10-CM

## 2013-11-04 DIAGNOSIS — Z87891 Personal history of nicotine dependence: Secondary | ICD-10-CM

## 2013-11-04 DIAGNOSIS — R739 Hyperglycemia, unspecified: Secondary | ICD-10-CM | POA: Diagnosis present

## 2013-11-04 DIAGNOSIS — M129 Arthropathy, unspecified: Secondary | ICD-10-CM | POA: Diagnosis present

## 2013-11-04 DIAGNOSIS — E43 Unspecified severe protein-calorie malnutrition: Secondary | ICD-10-CM | POA: Diagnosis present

## 2013-11-04 DIAGNOSIS — D696 Thrombocytopenia, unspecified: Secondary | ICD-10-CM | POA: Diagnosis present

## 2013-11-04 DIAGNOSIS — R1310 Dysphagia, unspecified: Principal | ICD-10-CM | POA: Diagnosis present

## 2013-11-04 DIAGNOSIS — Z79899 Other long term (current) drug therapy: Secondary | ICD-10-CM

## 2013-11-04 DIAGNOSIS — Z9119 Patient's noncompliance with other medical treatment and regimen: Secondary | ICD-10-CM

## 2013-11-04 DIAGNOSIS — L4050 Arthropathic psoriasis, unspecified: Secondary | ICD-10-CM | POA: Diagnosis present

## 2013-11-04 DIAGNOSIS — Z91199 Patient's noncompliance with other medical treatment and regimen due to unspecified reason: Secondary | ICD-10-CM

## 2013-11-04 DIAGNOSIS — E119 Type 2 diabetes mellitus without complications: Secondary | ICD-10-CM | POA: Diagnosis present

## 2013-11-04 HISTORY — DX: Type 2 diabetes mellitus without complications: E11.9

## 2013-11-04 HISTORY — DX: Essential (primary) hypertension: I10

## 2013-11-04 LAB — CBC WITH DIFFERENTIAL/PLATELET
Basophils Absolute: 0 10*3/uL (ref 0.0–0.1)
Basophils Relative: 0 % (ref 0–1)
Eosinophils Absolute: 0 10*3/uL (ref 0.0–0.7)
Eosinophils Relative: 0 % (ref 0–5)
HCT: 38.6 % — ABNORMAL LOW (ref 39.0–52.0)
Hemoglobin: 13.5 g/dL (ref 13.0–17.0)
Lymphocytes Relative: 10 % — ABNORMAL LOW (ref 12–46)
Lymphs Abs: 0.7 10*3/uL (ref 0.7–4.0)
MCH: 29.5 pg (ref 26.0–34.0)
MCHC: 35 g/dL (ref 30.0–36.0)
MCV: 84.5 fL (ref 78.0–100.0)
Monocytes Absolute: 0.8 10*3/uL (ref 0.1–1.0)
Monocytes Relative: 12 % (ref 3–12)
Neutro Abs: 5.5 10*3/uL (ref 1.7–7.7)
Neutrophils Relative %: 78 % — ABNORMAL HIGH (ref 43–77)
Platelets: 162 10*3/uL (ref 150–400)
RBC: 4.57 MIL/uL (ref 4.22–5.81)
RDW: 16.4 % — ABNORMAL HIGH (ref 11.5–15.5)
WBC: 7 10*3/uL (ref 4.0–10.5)

## 2013-11-04 LAB — COMPREHENSIVE METABOLIC PANEL
ALBUMIN: 2.1 g/dL — AB (ref 3.5–5.2)
ALT: 22 U/L (ref 0–53)
ANION GAP: 13 (ref 5–15)
AST: 31 U/L (ref 0–37)
Alkaline Phosphatase: 66 U/L (ref 39–117)
BILIRUBIN TOTAL: 0.3 mg/dL (ref 0.3–1.2)
BUN: 13 mg/dL (ref 6–23)
CHLORIDE: 102 meq/L (ref 96–112)
CO2: 21 mEq/L (ref 19–32)
Calcium: 8.2 mg/dL — ABNORMAL LOW (ref 8.4–10.5)
Creatinine, Ser: 0.58 mg/dL (ref 0.50–1.35)
GFR calc Af Amer: >90 mL/min (ref >90)
GFR calc non Af Amer: >90 mL/min (ref >90)
Glucose, Bld: 259 mg/dL — ABNORMAL HIGH (ref 70–99)
POTASSIUM: 4.3 meq/L (ref 3.7–5.3)
Sodium: 136 mEq/L — ABNORMAL LOW (ref 137–147)
Total Protein: 5.7 g/dL — ABNORMAL LOW (ref 6.0–8.3)

## 2013-11-04 LAB — GLUCOSE, CAPILLARY
Glucose-Capillary: 263 mg/dL — ABNORMAL HIGH (ref 70–99)
Glucose-Capillary: 191 mg/dL — ABNORMAL HIGH (ref 70–99)

## 2013-11-04 LAB — CBG MONITORING, ED: GLUCOSE-CAPILLARY: 297 mg/dL — AB (ref 70–99)

## 2013-11-04 MED ORDER — HYDROMORPHONE HCL PF 1 MG/ML IJ SOLN
1.0000 mg | Freq: Once | INTRAMUSCULAR | Status: AC
  Administered 2013-11-04: 1 mg via INTRAVENOUS
  Filled 2013-11-04: qty 1

## 2013-11-04 MED ORDER — SODIUM CHLORIDE 0.9 % IV SOLN
INTRAVENOUS | Status: DC
  Administered 2013-11-04 – 2013-11-05 (×3): via INTRAVENOUS

## 2013-11-04 MED ORDER — SODIUM CHLORIDE 0.9 % IV BOLUS (SEPSIS)
1000.0000 mL | Freq: Once | INTRAVENOUS | Status: AC
  Administered 2013-11-04: 1000 mL via INTRAVENOUS

## 2013-11-04 MED ORDER — MORPHINE SULFATE 4 MG/ML IJ SOLN
6.0000 mg | Freq: Once | INTRAMUSCULAR | Status: AC
Start: 2013-11-04 — End: 2013-11-04
  Administered 2013-11-04: 6 mg via INTRAVENOUS
  Filled 2013-11-04: qty 2

## 2013-11-04 MED ORDER — ONDANSETRON HCL 4 MG/2ML IJ SOLN
4.0000 mg | Freq: Once | INTRAMUSCULAR | Status: AC
  Administered 2013-11-04: 4 mg via INTRAVENOUS
  Filled 2013-11-04: qty 2

## 2013-11-04 MED ORDER — INSULIN ASPART 100 UNIT/ML ~~LOC~~ SOLN
0.0000 [IU] | Freq: Three times a day (TID) | SUBCUTANEOUS | Status: DC
  Administered 2013-11-04: 5 [IU] via SUBCUTANEOUS
  Administered 2013-11-05: 7 [IU] via SUBCUTANEOUS
  Administered 2013-11-05: 2 [IU] via SUBCUTANEOUS
  Filled 2013-11-04: qty 1

## 2013-11-04 MED ORDER — HYDROMORPHONE HCL PF 1 MG/ML IJ SOLN
1.0000 mg | INTRAMUSCULAR | Status: DC | PRN
  Administered 2013-11-04 – 2013-11-05 (×4): 1 mg via INTRAVENOUS
  Filled 2013-11-04 (×4): qty 1

## 2013-11-04 MED ORDER — IOHEXOL 300 MG/ML  SOLN
100.0000 mL | Freq: Once | INTRAMUSCULAR | Status: AC | PRN
Start: 2013-11-04 — End: 2013-11-04
  Administered 2013-11-04: 100 mL via INTRAVENOUS

## 2013-11-04 MED ORDER — PREDNISONE 20 MG PO TABS
20.0000 mg | ORAL_TABLET | Freq: Two times a day (BID) | ORAL | Status: DC
  Filled 2013-11-04 (×3): qty 1

## 2013-11-04 NOTE — ED Notes (Signed)
MD at bedside. 

## 2013-11-04 NOTE — ED Provider Notes (Signed)
CSN: [id number]     Arrival date & time 11/04/13  4193 History   First MD Initiated Contact with Patient 11/04/13 1053     Chief Complaint  Patient presents with  . Dysphagia     (Consider location/radiation/quality/duration/timing/severity/associated sxs/prior Treatment) HPI   Lawrence Jennings is a 58 year old male with history of psoratic arthritis and 5 pack year history presents to the ED with a complaint of trouble swallowing and painful swallowing. He states it started 2 days ago. He notes that he has been able to eat a grapefruit this morning and eat dinner last night consisting of tomato soup, however it was incredibly painful and difficult to swallow. He states that he feels like food sometimes got "stuck in his throat", as patient points to the Adam's apple region of his throat. He states that he has regurgitated his food and saliva a couple of times. He has been able to take his medications and drink water, however he notes it has been difficult due to the pain and inability to swallow very well. He states the pain is 10/10.  Endorses: fatigue, body aches, headache, neck pain, hoarseness, shortness of breath, nausea, and joint pain. Denies: fever, cough, chest pain, abdominal pain, vomiting.   Past Medical History  Diagnosis Date  . Allergic reaction   . Sinus disease   . Back pain   . Arthritis   . Edema    Past Surgical History  Procedure Laterality Date  . No past surgeries     No family history on file. History  Substance Use Topics  . Smoking status: Former Smoker -- 0.25 packs/day for 20 years    Types: Cigarettes    Quit date: 09/13/2013  . Smokeless tobacco: Never Used  . Alcohol Use: No    Review of Systems All other systems negative except as documented in the HPI. All pertinent positives and negatives as reviewed in the HPI.   Allergies  Review of patient's allergies indicates no known allergies.  Home Medications   Prior to Admission medications    Medication Sig Start Date End Date Taking? Authorizing Provider  Adalimumab (HUMIRA) 40 MG/0.8ML PSKT Inject 40 mg into the skin once a week.   Yes Historical Provider, MD  enalapril (VASOTEC) 2.5 MG tablet Take 1.25 mg by mouth daily.   Yes Historical Provider, MD  insulin aspart (NOVOLOG) 100 UNIT/ML injection Inject 0-15 Units into the skin 3 (three) times daily with meals. 10/31/13  Yes Jeanann Lewandowsky, MD  lidocaine (XYLOCAINE) 2 % solution Use as directed 20 mLs in the mouth or throat as needed for mouth pain. 10/31/13  Yes Jeanann Lewandowsky, MD  lisinopril (PRINIVIL,ZESTRIL) 5 MG tablet Take 5 mg by mouth daily.   Yes Historical Provider, MD  metFORMIN (GLUCOPHAGE) 500 MG tablet Take 1 tablet (500 mg total) by mouth daily with breakfast. 10/31/13  Yes Jeanann Lewandowsky, MD  predniSONE (DELTASONE) 20 MG tablet Take 20 mg by mouth 2 (two) times daily with a meal.   Yes Historical Provider, MD   BP 112/83  Pulse 119  Temp(Src) 97.9 F (36.6 C) (Oral)  Resp 18  SpO2 99% Physical Exam  Constitutional: He is oriented to person, place, and time. He appears well-developed. No distress.  Underweight   HENT:  Head: Normocephalic.  Right Ear: External ear normal.  Left Ear: External ear normal.  Mouth/Throat: Oropharynx is clear and moist.  Psoarsis plaques noted on patient's scalp.   Eyes: EOM are normal. Pupils are  equal, round, and reactive to light. Right eye exhibits no discharge.  Neck: Normal range of motion. Neck supple.  Tenderness upon palpation of right side of anterior cervical chain. No tenderness upon left side of anterior cervical chain.   Cardiovascular: Regular rhythm and normal heart sounds.   Increased rate.   Pulmonary/Chest: Effort normal and breath sounds normal. He has no wheezes. He has no rales.  Abdominal: Soft. Bowel sounds are normal. He exhibits no distension. There is no tenderness.  Musculoskeletal:  Tenderness to palpation of joints  Neurological: He is  alert and oriented to person, place, and time.  Skin: Skin is warm and dry.  Psoarsis plaques on patient's face, scalp, hands bilaterally, LLQ of abdomen, and lower extremities.     ED Course  Procedures (including critical care time) Labs Review Labs Reviewed  CBC WITH DIFFERENTIAL  COMPREHENSIVE METABOLIC PANEL    Imaging Review Dg Chest 2 View  11/04/2013   CLINICAL DATA:  Dysphagia  EXAM: CHEST  2 VIEW  COMPARISON:  10/14/2013  FINDINGS: There are mild bilateral chronic bronchitic changes. There is no focal parenchymal opacity, pleural effusion, or pneumothorax. The heart and mediastinal contours are unremarkable.  The osseous structures are unremarkable.  IMPRESSION: No active cardiopulmonary disease.   Electronically Signed   By: Elige Ko   On: 11/04/2013 14:42   Ct Soft Tissue Neck W Contrast  11/04/2013   CLINICAL DATA:  Three days of dysphagia  EXAM: CT NECK WITH CONTRAST  TECHNIQUE: Multidetector CT imaging of the neck was performed using the standard protocol following the bolus administration of intravenous contrast.  CONTRAST:  OMNIPAQUE IOHEXOL 300 MG/ML  SOLN  COMPARISON:  Soft tissue radiographs of the neck of October 04, 2013  FINDINGS: There is opacification of the visualized portions of the right frontal sinus. The other paranasal sinuses are clear. The nasal passages are patent. The tonsillar soft tissues are normal. There is mild prominence of the soft tissues of the posterior wall of the oropharynx on the right beginning on images 43 and extending through image 60. No enhancing mass or soft tissue gas collections aredemonstrated here. The soft tissues of the tongue are normal. The epiglottis is normal.  The prevertebral soft tissues are normal in density. The laryngeal structures are normal. There is a 6 mm diameter hypodense nodule in the right hepatic lobe but there other areas of poorly defined hypodensity in both thyroid lobes. There is no mass effect upon the trachea  nor of the upper thoracic esophagus. No foreign bodies are demonstrated.  The parotid and submandibular glands are normal. The jugular and carotid vessels are normal. There is no lymphadenopathy. The sternocleidomastoid muscles are normal.  There is mild degenerative disc space narrowing at C5-6 with anterior and posterior osteophytes. The facets are normal. The mandible and temporomandibular joints are normal. The pulmonary apices are clear.  IMPRESSION: 1. There is subtle soft tissue fullness to the right of midline involving the posterior oropharyngeal wall without a discrete mass. Direct visualization is recommended. 2. The hypopharynx and larynx and upper esophagus are normal. 3. The thyroid gland is normal sized but its echotexture is heterogeneous. Elective thyroid ultrasound is recommended. 4. Probable right frontal sinus inflammation.   Electronically Signed   By: David  [place]   On: 11/04/2013 15:23     Patient be admitted to the hospital for further evaluation and care.  I spoke with ENT and the [facility]   Jamesetta Orleans [place], [place] 11/06/13  0609 

## 2013-11-04 NOTE — H&P (Signed)
History and Physical    Lawrence Jennings KNL:[id number] DOB: April 10, 1956 DOA: 11/04/2013  Referring physician: Dr. Manus Gunning PCP: Jeanann Lewandowsky, MD  Specialists: ENT  Chief Complaint: inability to swallow  HPI: Lawrence Jennings is a 58 y.o. male has a past medical history significant for HTN, DM, tobacco abuse, hospitalized 3 times in the past 2 months with rash and arthritis ultimately diagnosed with psoriatic arthritis followed by Dr. Dierdre Forth, on Prednisone and Humira currently He presents to the ED today with a week long, progressive inability to swallow. He described pain on the right side of his neck with swallowing as well as difficulties with swallowing. Feels like something "is stuck" in his throat and is barely able to drink or eat food even after chopping it. At times he feels like he is barely able to handle his own saliva. Endorses regurgitation also.  He denies fever or chills, has no cough/chest congestion. This has been going on for a week, saw his PCPfew days ago and received lidocaine solution but does not feel like its helping.   Review of Systems: as per HPI otherwise negative.   Past Medical History  Diagnosis Date  . Allergic reaction   . Sinus disease   . Back pain   . Arthritis   . Edema   . Hypertension   . Diabetes mellitus without complication     insulin/metformin   Past Surgical History  Procedure Laterality Date  . No past surgeries     Social History:  reports that he quit smoking about 7 weeks ago. His smoking use included Cigarettes. He has a 5 pack-year smoking history. He has never used smokeless tobacco. He reports that he does not drink alcohol or use illicit drugs.  No Known Allergies  Family history noncontributory.  Prior to Admission medications   Medication Sig Start Date End Date Taking? Authorizing Provider  Adalimumab (HUMIRA) 40 MG/0.8ML PSKT Inject 40 mg into the skin once a week.   Yes Historical Provider, MD  enalapril (VASOTEC)  2.5 MG tablet Take 1.25 mg by mouth daily.   Yes Historical Provider, MD  insulin aspart (NOVOLOG) 100 UNIT/ML injection Inject 0-15 Units into the skin 3 (three) times daily with meals. 10/31/13  Yes Jeanann Lewandowsky, MD  lidocaine (XYLOCAINE) 2 % solution Use as directed 20 mLs in the mouth or throat as needed for mouth pain. 10/31/13  Yes Jeanann Lewandowsky, MD  lisinopril (PRINIVIL,ZESTRIL) 5 MG tablet Take 5 mg by mouth daily.   Yes Historical Provider, MD  metFORMIN (GLUCOPHAGE) 500 MG tablet Take 1 tablet (500 mg total) by mouth daily with breakfast. 10/31/13  Yes Jeanann Lewandowsky, MD  predniSONE (DELTASONE) 20 MG tablet Take 20 mg by mouth 2 (two) times daily with a meal.   Yes Historical Provider, MD   Physical Exam: Filed Vitals:   11/04/13 1005 11/04/13 1311 11/04/13 1636  BP: 112/83 113/74 131/97  Pulse: 119 83 74  Temp: 97.9 F (36.6 C) 98 F (36.7 C) 97.8 F (36.6 C)  TempSrc: Oral Oral Oral  Resp: 18 18 20   SpO2: 99% 97% 99%     General:  No apparent distress  Eyes: no scleral icterus  ENT: moist oropharynx  Neck: supple, no JVD  Cardiovascular: regular rate without MRG; 2+ peripheral pulses  Respiratory: CTA biL, good air movement without wheezing, rhonchi or crackled  Abdomen: soft, non tender to palpation, positive bowel sounds, no guarding, no rebound  Skin: healing old rash on back, scalp, palms, elbows  Musculoskeletal: no peripheral edema  Psychiatric: normal mood and affect  Neurologic: non focal  Labs on Admission:  Basic Metabolic Panel:  Recent Labs Lab 11/04/13 1307  NA 136*  K 4.3  CL 102  CO2 21  GLUCOSE 259*  BUN 13  CREATININE 0.58  CALCIUM 8.2*   Liver Function Tests:  Recent Labs Lab 11/04/13 1307  AST 31  ALT 22  ALKPHOS 66  BILITOT 0.3  PROT 5.7*  ALBUMIN 2.1*   No results found for this basename: LIPASE, AMYLASE,  in the last 168 hours No results found for this basename: AMMONIA,  in the last 168  hours CBC:  Recent Labs Lab 11/04/13 1211  WBC 7.0  NEUTROABS 5.5  HGB 13.5  HCT 38.6*  MCV 84.5  PLT 162   BNP (last 3 results)  Recent Labs  10/08/13 1313 10/14/13 1330  PROBNP 206.1* 169.4*   Radiological Exams on Admission: Dg Chest 2 View  11/04/2013   CLINICAL DATA:  Dysphagia  EXAM: CHEST  2 VIEW  COMPARISON:  10/14/2013  FINDINGS: There are mild bilateral chronic bronchitic changes. There is no focal parenchymal opacity, pleural effusion, or pneumothorax. The heart and mediastinal contours are unremarkable.  The osseous structures are unremarkable.  IMPRESSION: No active cardiopulmonary disease.   Electronically Signed   By: Elige Ko   On: 11/04/2013 14:42   Ct Soft Tissue Neck W Contrast  11/04/2013   CLINICAL DATA:  Three days of dysphagia  EXAM: CT NECK WITH CONTRAST  TECHNIQUE: Multidetector CT imaging of the neck was performed using the standard protocol following the bolus administration of intravenous contrast.  CONTRAST:  OMNIPAQUE IOHEXOL 300 MG/ML  SOLN  COMPARISON:  Soft tissue radiographs of the neck of October 04, 2013  FINDINGS: There is opacification of the visualized portions of the right frontal sinus. The other paranasal sinuses are clear. The nasal passages are patent. The tonsillar soft tissues are normal. There is mild prominence of the soft tissues of the posterior wall of the oropharynx on the right beginning on images 43 and extending through image 60. No enhancing mass or soft tissue gas collections aredemonstrated here. The soft tissues of the tongue are normal. The epiglottis is normal.  The prevertebral soft tissues are normal in density. The laryngeal structures are normal. There is a 6 mm diameter hypodense nodule in the right hepatic lobe but there other areas of poorly defined hypodensity in both thyroid lobes. There is no mass effect upon the trachea nor of the upper thoracic esophagus. No foreign bodies are demonstrated.  The parotid and  submandibular glands are normal. The jugular and carotid vessels are normal. There is no lymphadenopathy. The sternocleidomastoid muscles are normal.  There is mild degenerative disc space narrowing at C5-6 with anterior and posterior osteophytes. The facets are normal. The mandible and temporomandibular joints are normal. The pulmonary apices are clear.  IMPRESSION: 1. There is subtle soft tissue fullness to the right of midline involving the posterior oropharyngeal wall without a discrete mass. Direct visualization is recommended. 2. The hypopharynx and larynx and upper esophagus are normal. 3. The thyroid gland is normal sized but its echotexture is heterogeneous. Elective thyroid ultrasound is recommended. 4. Probable right frontal sinus inflammation.   Electronically Signed   By: David  [place]   On: 11/04/2013 15:23   EKG: Independently reviewed.  Assessment/Plan Active Problems:   Protein-calorie malnutrition, severe   Hyperglycemia   Psoriatic arthritis   FTT (failure to thrive)  in adult   Psoriasis   Dysphagia  Dysphagia / odynophagia - CT scan with subtle soft tissue fullness, ?etiology. ENT consulted by EDP, appreciate input. Clinically it does not look infectious he is afebrile and without leukocytosis. NPO until evaluated by ENT.  Psoriatic arthritis - continue Prednisone. He got his weekly Humira yesterday.   DM - NPO, provide SSI. Last A1C on 6/15 was 11.9  Diet: NPO Fluids: NS DVT Prophylaxis: SCD  Code Status: Full  Family Communication: d/w patient  Disposition Plan: inpatent  Time spent: 50  This note has been created with Education officer, environmental. Any transcriptional errors are unintentional.   Costin M. Elvera Lennox, MD [facility] Pager [phone]  If 7PM-7AM, please contact night-coverage [email or web address] Password [facility] 11/04/2013, 4:39 PM

## 2013-11-04 NOTE — ED Notes (Signed)
Per pt, states difficulty swallowing for 3 days-was seen at [facility] for same issue

## 2013-11-04 NOTE — ED Notes (Signed)
Hospitalist bedside 

## 2013-11-05 DIAGNOSIS — Z91199 Patient's noncompliance with other medical treatment and regimen due to unspecified reason: Secondary | ICD-10-CM | POA: Diagnosis not present

## 2013-11-05 DIAGNOSIS — Z87891 Personal history of nicotine dependence: Secondary | ICD-10-CM | POA: Diagnosis not present

## 2013-11-05 DIAGNOSIS — E43 Unspecified severe protein-calorie malnutrition: Secondary | ICD-10-CM | POA: Diagnosis present

## 2013-11-05 DIAGNOSIS — L4050 Arthropathic psoriasis, unspecified: Secondary | ICD-10-CM | POA: Diagnosis present

## 2013-11-05 DIAGNOSIS — Z9119 Patient's noncompliance with other medical treatment and regimen: Secondary | ICD-10-CM | POA: Diagnosis not present

## 2013-11-05 DIAGNOSIS — I10 Essential (primary) hypertension: Secondary | ICD-10-CM | POA: Diagnosis present

## 2013-11-05 DIAGNOSIS — D696 Thrombocytopenia, unspecified: Secondary | ICD-10-CM | POA: Diagnosis present

## 2013-11-05 DIAGNOSIS — E119 Type 2 diabetes mellitus without complications: Secondary | ICD-10-CM | POA: Diagnosis present

## 2013-11-05 DIAGNOSIS — Z794 Long term (current) use of insulin: Secondary | ICD-10-CM | POA: Diagnosis not present

## 2013-11-05 DIAGNOSIS — M129 Arthropathy, unspecified: Secondary | ICD-10-CM | POA: Diagnosis present

## 2013-11-05 DIAGNOSIS — R1310 Dysphagia, unspecified: Secondary | ICD-10-CM | POA: Diagnosis present

## 2013-11-05 DIAGNOSIS — R627 Adult failure to thrive: Secondary | ICD-10-CM | POA: Diagnosis present

## 2013-11-05 DIAGNOSIS — Z79899 Other long term (current) drug therapy: Secondary | ICD-10-CM | POA: Diagnosis not present

## 2013-11-05 LAB — CBC
HCT: 37.8 % — ABNORMAL LOW (ref 39.0–52.0)
Hemoglobin: 12.5 g/dL — ABNORMAL LOW (ref 13.0–17.0)
MCH: 28.8 pg (ref 26.0–34.0)
MCHC: 33.1 g/dL (ref 30.0–36.0)
MCV: 87.1 fL (ref 78.0–100.0)
PLATELETS: 119 10*3/uL — AB (ref 150–400)
RBC: 4.34 MIL/uL (ref 4.22–5.81)
RDW: 16.5 % — ABNORMAL HIGH (ref 11.5–15.5)
WBC: 4.8 10*3/uL (ref 4.0–10.5)

## 2013-11-05 LAB — COMPREHENSIVE METABOLIC PANEL
ALT: 25 U/L (ref 0–53)
AST: 27 U/L (ref 0–37)
Albumin: 2.3 g/dL — ABNORMAL LOW (ref 3.5–5.2)
Alkaline Phosphatase: 68 U/L (ref 39–117)
Anion gap: 11 (ref 5–15)
BUN: 11 mg/dL (ref 6–23)
CALCIUM: 8.5 mg/dL (ref 8.4–10.5)
CO2: 24 meq/L (ref 19–32)
Chloride: 103 mEq/L (ref 96–112)
Creatinine, Ser: 0.58 mg/dL (ref 0.50–1.35)
GLUCOSE: 278 mg/dL — AB (ref 70–99)
Potassium: 3.8 mEq/L (ref 3.7–5.3)
Sodium: 138 mEq/L (ref 137–147)
Total Bilirubin: 0.3 mg/dL (ref 0.3–1.2)
Total Protein: 6.1 g/dL (ref 6.0–8.3)

## 2013-11-05 LAB — GLUCOSE, CAPILLARY
GLUCOSE-CAPILLARY: 200 mg/dL — AB (ref 70–99)
GLUCOSE-CAPILLARY: 331 mg/dL — AB (ref 70–99)
GLUCOSE-CAPILLARY: 334 mg/dL — AB (ref 70–99)
Glucose-Capillary: 318 mg/dL — ABNORMAL HIGH (ref 70–99)

## 2013-11-05 MED ORDER — MENTHOL 3 MG MT LOZG
1.0000 | LOZENGE | OROMUCOSAL | Status: DC | PRN
  Administered 2013-11-05: 3 mg via ORAL
  Filled 2013-11-05 (×2): qty 9

## 2013-11-05 MED ORDER — NYSTATIN 100000 UNIT/ML MT SUSP
5.0000 mL | Freq: Four times a day (QID) | OROMUCOSAL | Status: DC
  Administered 2013-11-05 – 2013-11-07 (×12): 500000 [IU] via ORAL
  Filled 2013-11-05 (×16): qty 5

## 2013-11-05 MED ORDER — INSULIN ASPART 100 UNIT/ML ~~LOC~~ SOLN
0.0000 [IU] | Freq: Every day | SUBCUTANEOUS | Status: DC
  Administered 2013-11-05 – 2013-11-07 (×3): 4 [IU] via SUBCUTANEOUS

## 2013-11-05 MED ORDER — PREDNISONE 50 MG PO TABS
60.0000 mg | ORAL_TABLET | Freq: Two times a day (BID) | ORAL | Status: DC
  Administered 2013-11-05 – 2013-11-07 (×5): 60 mg via ORAL
  Filled 2013-11-05 (×7): qty 1

## 2013-11-05 MED ORDER — INSULIN ASPART 100 UNIT/ML ~~LOC~~ SOLN
0.0000 [IU] | Freq: Three times a day (TID) | SUBCUTANEOUS | Status: DC
  Administered 2013-11-05 – 2013-11-06 (×2): 15 [IU] via SUBCUTANEOUS
  Administered 2013-11-06: 11 [IU] via SUBCUTANEOUS
  Administered 2013-11-06 – 2013-11-07 (×3): 20 [IU] via SUBCUTANEOUS
  Administered 2013-11-07: 4 [IU] via SUBCUTANEOUS
  Administered 2013-11-08: 15 [IU] via SUBCUTANEOUS

## 2013-11-05 MED ORDER — ENOXAPARIN SODIUM 40 MG/0.4ML ~~LOC~~ SOLN
40.0000 mg | SUBCUTANEOUS | Status: DC
  Administered 2013-11-05 – 2013-11-07 (×3): 40 mg via SUBCUTANEOUS
  Filled 2013-11-05 (×4): qty 0.4

## 2013-11-05 MED ORDER — INSULIN GLARGINE 100 UNIT/ML ~~LOC~~ SOLN
10.0000 [IU] | Freq: Every day | SUBCUTANEOUS | Status: DC
  Administered 2013-11-05 – 2013-11-06 (×2): 10 [IU] via SUBCUTANEOUS
  Filled 2013-11-05 (×2): qty 0.1

## 2013-11-05 MED ORDER — HYDROMORPHONE HCL PF 1 MG/ML IJ SOLN
1.0000 mg | INTRAMUSCULAR | Status: DC | PRN
  Administered 2013-11-05 – 2013-11-07 (×13): 1 mg via INTRAVENOUS
  Filled 2013-11-05 (×13): qty 1

## 2013-11-05 MED ORDER — ENALAPRIL MALEATE 2.5 MG PO TABS
1.2500 mg | ORAL_TABLET | Freq: Every day | ORAL | Status: DC
  Administered 2013-11-05 – 2013-11-07 (×3): 1.25 mg via ORAL
  Filled 2013-11-05 (×4): qty 0.5

## 2013-11-05 NOTE — Consult Note (Signed)
Reason for Consult: Dysphagia Referring Physician: Marzetta Board, MD  HPI:  Lawrence Jennings is an 58 y.o. male who was admitted from [facility] yesterday for evaluation of his dysphagia. The patient has a past medical history significant for HTN, DM, tobacco abuse, hospitalized 3 times in the past 2 months for psoriatic arthritis.  He is followed by Dr. Amil Amen, on Prednisone and Humira currently. He presents to the ED yesterday with a week long, progressive inability to swallow. He described pain on the right side of his neck, as well as difficulties with swallowing. Feels like something "is stuck" in his throat and is barely able to drink or eat food even after chopping it. At times he feels like he is barely able to handle his own saliva. He denies fever or chills, has no cough/chest congestion. This has been going on for a week, saw his PCP a few days ago and received lidocaine solution but does not feel like its helping. His neck CT shows subtle soft tissue fullness to the right of midline involving the posterior oropharyngeal wall.  No discrete mass noted.   Past Medical History  Diagnosis Date  . Allergic reaction   . Sinus disease   . Back pain   . Arthritis   . Edema   . Hypertension   . Diabetes mellitus without complication     insulin/metformin    Past Surgical History  Procedure Laterality Date  . No past surgeries      No family history on file.  Social History:  reports that he quit smoking about 7 weeks ago. His smoking use included Cigarettes. He has a 5 pack-year smoking history. He has never used smokeless tobacco. He reports that he does not drink alcohol or use illicit drugs.  Allergies: No Known Allergies  Medications:  I have reviewed the patient's current medications. Scheduled: . insulin aspart  0-9 Units Subcutaneous TID WC  . predniSONE  20 mg Oral BID WC   FYB:OFBPZWCHENIDP (DILAUDID) injection  Results for orders placed during the hospital  encounter of 11/04/13 (from the past 48 hour(s))  CBC WITH DIFFERENTIAL     Status: Abnormal   Collection Time    11/04/13 12:11 PM      Result Value Ref Range   WBC 7.0  4.0 - 10.5 K/uL   RBC 4.57  4.22 - 5.81 MIL/uL   Hemoglobin 13.5  13.0 - 17.0 g/dL   HCT 38.6 (*) 39.0 - 52.0 %   MCV 84.5  78.0 - 100.0 fL   MCH 29.5  26.0 - 34.0 pg   MCHC 35.0  30.0 - 36.0 g/dL   RDW 16.4 (*) 11.5 - 15.5 %   Platelets 162  150 - 400 K/uL   Neutrophils Relative % 78 (*) 43 - 77 %   Lymphocytes Relative 10 (*) 12 - 46 %   Monocytes Relative 12  3 - 12 %   Eosinophils Relative 0  0 - 5 %   Basophils Relative 0  0 - 1 %   Neutro Abs 5.5  1.7 - 7.7 K/uL   Lymphs Abs 0.7  0.7 - 4.0 K/uL   Monocytes Absolute 0.8  0.1 - 1.0 K/uL   Eosinophils Absolute 0.0  0.0 - 0.7 K/uL   Basophils Absolute 0.0  0.0 - 0.1 K/uL   Smear Review LARGE PLATELETS PRESENT    COMPREHENSIVE METABOLIC PANEL     Status: Abnormal   Collection Time    11/04/13  1:07 PM      Result Value Ref Range   Sodium 136 (*) 137 - 147 mEq/L   Potassium 4.3  3.7 - 5.3 mEq/L   Comment: SLIGHT HEMOLYSIS     HEMOLYSIS AT THIS LEVEL MAY AFFECT RESULT   Chloride 102  96 - 112 mEq/L   CO2 21  19 - 32 mEq/L   Glucose, Bld 259 (*) 70 - 99 mg/dL   BUN 13  6 - 23 mg/dL   Creatinine, Ser 0.58  0.50 - 1.35 mg/dL   Calcium 8.2 (*) 8.4 - 10.5 mg/dL   Total Protein 5.7 (*) 6.0 - 8.3 g/dL   Albumin 2.1 (*) 3.5 - 5.2 g/dL   AST 31  0 - 37 U/L   Comment: SLIGHT HEMOLYSIS     HEMOLYSIS AT THIS LEVEL MAY AFFECT RESULT   ALT 22  0 - 53 U/L   Comment: SLIGHT HEMOLYSIS     HEMOLYSIS AT THIS LEVEL MAY AFFECT RESULT   Alkaline Phosphatase 66  39 - 117 U/L   Total Bilirubin 0.3  0.3 - 1.2 mg/dL   GFR calc non Af Amer >90  >90 mL/min   GFR calc Af Amer >90  >90 mL/min   Comment: (NOTE)     The eGFR has been calculated using the CKD EPI equation.     This calculation has not been validated in all clinical situations.     eGFR's persistently <90 mL/min  signify possible Chronic Kidney     Disease.   Anion gap 13  5 - 15  CBG MONITORING, ED     Status: Abnormal   Collection Time    11/04/13  5:17 PM      Result Value Ref Range   Glucose-Capillary 297 (*) 70 - 99 mg/dL  GLUCOSE, CAPILLARY     Status: Abnormal   Collection Time    11/04/13  6:20 PM      Result Value Ref Range   Glucose-Capillary 263 (*) 70 - 99 mg/dL  GLUCOSE, CAPILLARY     Status: Abnormal   Collection Time    11/04/13  9:36 PM      Result Value Ref Range   Glucose-Capillary 191 (*) 70 - 99 mg/dL   Comment 1 Notify RN    COMPREHENSIVE METABOLIC PANEL     Status: Abnormal   Collection Time    11/05/13  5:18 AM      Result Value Ref Range   Sodium 138  137 - 147 mEq/L   Potassium 3.8  3.7 - 5.3 mEq/L   Chloride 103  96 - 112 mEq/L   CO2 24  19 - 32 mEq/L   Glucose, Bld 278 (*) 70 - 99 mg/dL   BUN 11  6 - 23 mg/dL   Creatinine, Ser 0.58  0.50 - 1.35 mg/dL   Calcium 8.5  8.4 - 10.5 mg/dL   Total Protein 6.1  6.0 - 8.3 g/dL   Albumin 2.3 (*) 3.5 - 5.2 g/dL   AST 27  0 - 37 U/L   ALT 25  0 - 53 U/L   Alkaline Phosphatase 68  39 - 117 U/L   Total Bilirubin 0.3  0.3 - 1.2 mg/dL   GFR calc non Af Amer >90  >90 mL/min   GFR calc Af Amer >90  >90 mL/min   Comment: (NOTE)     The eGFR has been calculated using the CKD EPI equation.     This calculation has  not been validated in all clinical situations.     eGFR's persistently <90 mL/min signify possible Chronic Kidney     Disease.   Anion gap 11  5 - 15  CBC     Status: Abnormal   Collection Time    11/05/13  5:18 AM      Result Value Ref Range   WBC 4.8  4.0 - 10.5 K/uL   RBC 4.34  4.22 - 5.81 MIL/uL   Hemoglobin 12.5 (*) 13.0 - 17.0 g/dL   HCT 37.8 (*) 39.0 - 52.0 %   MCV 87.1  78.0 - 100.0 fL   MCH 28.8  26.0 - 34.0 pg   MCHC 33.1  30.0 - 36.0 g/dL   RDW 16.5 (*) 11.5 - 15.5 %   Platelets 119 (*) 150 - 400 K/uL   Comment: DELTA CHECK NOTED     SPECIMEN CHECKED FOR CLOTS     REPEATED TO VERIFY      PLATELET COUNT CONFIRMED BY SMEAR    Dg Chest 2 View  11/04/2013   CLINICAL DATA:  Dysphagia  EXAM: CHEST  2 VIEW  COMPARISON:  10/14/2013  FINDINGS: There are mild bilateral chronic bronchitic changes. There is no focal parenchymal opacity, pleural effusion, or pneumothorax. The heart and mediastinal contours are unremarkable.  The osseous structures are unremarkable.  IMPRESSION: No active cardiopulmonary disease.   Electronically Signed   By: Kathreen Devoid   On: 11/04/2013 14:42   Ct Soft Tissue Neck W Contrast  11/04/2013   CLINICAL DATA:  Three days of dysphagia  EXAM: CT NECK WITH CONTRAST  TECHNIQUE: Multidetector CT imaging of the neck was performed using the standard protocol following the bolus administration of intravenous contrast.  CONTRAST:  157m OMNIPAQUE IOHEXOL 300 MG/ML  SOLN  COMPARISON:  Soft tissue radiographs of the neck of October 04, 2013  FINDINGS: There is opacification of the visualized portions of the right frontal sinus. The other paranasal sinuses are clear. The nasal passages are patent. The tonsillar soft tissues are normal. There is mild prominence of the soft tissues of the posterior wall of the oropharynx on the right beginning on images 43 and extending through image 60. No enhancing mass or soft tissue gas collections aredemonstrated here. The soft tissues of the tongue are normal. The epiglottis is normal.  The prevertebral soft tissues are normal in density. The laryngeal structures are normal. There is a 6 mm diameter hypodense nodule in the right hepatic lobe but there other areas of poorly defined hypodensity in both thyroid lobes. There is no mass effect upon the trachea nor of the upper thoracic esophagus. No foreign bodies are demonstrated.  The parotid and submandibular glands are normal. The jugular and carotid vessels are normal. There is no lymphadenopathy. The sternocleidomastoid muscles are normal.  There is mild degenerative disc space narrowing at C5-6 with  anterior and posterior osteophytes. The facets are normal. The mandible and temporomandibular joints are normal. The pulmonary apices are clear.  IMPRESSION: 1. There is subtle soft tissue fullness to the right of midline involving the posterior oropharyngeal wall without a discrete mass. Direct visualization is recommended. 2. The hypopharynx and larynx and upper esophagus are normal. 3. The thyroid gland is normal sized but its echotexture is heterogeneous. Elective thyroid ultrasound is recommended. 4. Probable right frontal sinus inflammation.   Electronically Signed   By: David  JMartinique  On: 11/04/2013 15:23   Blood pressure 121/85, pulse 64, temperature 97.8 F (36.6  C), temperature source Oral, resp. rate 18, SpO2 100.00%. Physical Exam  Constitutional: He is oriented to person, place, and time. He appears well-developed. No distress.  Head: Normocephalic.  Ears: Normal auricles and EACs. Nose: Normal mucosa and septum. Mouth/Throat: Oropharynx is clear and moist. No mass or lesion noted. Psoarsis plaques noted on patient's scalp.  Eyes: EOM are normal. Pupils are equal, round, and reactive to light.  Neck: Normal range of motion. Neck supple.  Cardiovascular: Regular rhythm and normal heart sounds.  Neurological: He is alert and oriented to person, place, and time.  Skin: Skin is warm and dry.   Procedure:  Flexible Fiberoptic Laryngoscopy Anesthesia: Topical oxymetazoline and lidocaine Indication: Persistent throat pain. Description: Risks, benefits, and alternatives of flexible endoscopy were explained to the patient. Specific mention was made of the risk of throat numbness with difficulty swallowing, possible bleeding from the nose and mouth, and pain from the procedure.  The patient gave oral consent to proceed.  The nasal cavities were decongested and anesthetised with a combination of oxymetazoline and 4% lidocaine solution.  The flexible scope was inserted into the right nasal  cavity and advanced towards the nasopharynx.  Visualized mucosa over the turbinates and septum were normal.  The nasopharynx was clear.  Oropharyngeal walls were without lesion or mass. Mild fullness is noted at the right posterior oropharyngeal mucosa without surface changes. Hypopharynx was also without  lesion or edema.  Larynx was mobile without lesions.  No lesions or asymmetry in the supraglottic larynx. True vocal folds were pale yellow and mobile.  Assessment/Plan: Dysphagia of unknown etiology.  No suspicious mass or lesion is noted on the laryngoscopy exam.  Mild fullness is noted at the right posterior oropharyngeal mucosa without surface changes. ? Autoimmune/ inflammatory changes of the prevertebral fascia? Recommend a barium swallow study. Continue with prednisone. Pain control prn.  Fredi Geiler,SUI W 11/05/2013, 7:14 AM

## 2013-11-05 NOTE — Progress Notes (Signed)
PROGRESS NOTE  Lawrence Jennings QIW:[id number] DOB: [birth year] DOA: 11/04/2013 PCP: Jeanann Lewandowsky, MD  HPI: Lawrence Jennings is a 58 y.o. male has a past medical history significant for HTN, DM, tobacco abuse, hospitalized 3 times in the past 2 months with rash and arthritis ultimately diagnosed with psoriatic arthritis followed by Dr. Dierdre Forth, on Prednisone and Humira currently He presents to the ED today with a week long, progressive inability to swallow. He described pain on the right side of his neck with swallowing as well as difficulties with swallowing. Feels like something "is stuck" in his throat and is barely able to drink or eat food even after chopping it. At times he feels like he is barely able to handle his own saliva. Endorses regurgitation also. He denies fever or chills, has no cough/chest congestion. This has been going on for a week, saw his PCPfew days ago and received lidocaine solution but does not feel like its helping.   Assessment/Plan: Odynophagia - CT scan with subtle soft tissue fullness,  - ENT consulted and Dr. Suszanne Conners did a flexible endoscopy 7/4 am and was without suspicious mass or lesion, ?autoimmune/inflammatory changes of the of the prevertebral fascia - does not look infectious, afebrile, no leukocytosis  - this worsened after steroid taper, will go back up to 60 daily and monitor - allow chopped diet, advance to diabetic if he does well - wanted to obtain a barium swallow but this study cannot be done over the weekend at [facility] per Radiology, will do it on Monday Psoriatic arthritis - continue Prednisone. He got his weekly Humira the day prior to admission  DM - NPO, provide SSI. Last A1C on 6/15 was 11.9 - add Lantus Malnutrition - consult nutrition Thrombocytopenia - ?etiology, dilutional, all lines down, will repeat in am, if < 100 d/c Lovenox  Diet: chopped Fluids: none DVT Prophylaxis: Lovenox  Code Status: Full Family Communication: d/w patient    Disposition Plan: inpatient  Consultants:  ENT  Procedures:  Flexible larongoscopy 7/4   Antibiotics - none   HPI/Subjective: Ongoing neck pain  Objective: Filed Vitals:   11/04/13 1718 11/04/13 1750 11/04/13 2125 11/05/13 0540  BP: 126/83 135/89 129/82 121/85  Pulse: 68 68 71 64  Temp: 97.9 F (36.6 C) 97.4 F (36.3 C) 98 F (36.7 C) 97.8 F (36.6 C)  TempSrc: Oral Axillary Oral Oral  Resp: 18 18 18 18   SpO2: 97% 100% 100% 100%    Intake/Output Summary (Last 24 hours) at 11/05/13 0908 Last data filed at 11/05/13 0540  Gross per 24 hour  Intake  847.5 ml  Output   1400 ml  Net -552.5 ml   There were no vitals filed for this visit.  Exam:   General:  NAD  Cardiovascular: regular rate and rhythm, without MRG  Respiratory: good air movement, clear to auscultation throughout, no wheezing, ronchi or rales  Abdomen: soft, not tender to palpation, positive bowel sounds  MSK: no peripheral edema  Neuro: CN 2-12 grossly intact, MS 5/5 in all 4  Data Reviewed: Basic Metabolic Panel:  Recent Labs Lab 11/04/13 1307 11/05/13 0518  NA 136* 138  K 4.3 3.8  CL 102 103  CO2 21 24  GLUCOSE 259* 278*  BUN 13 11  CREATININE 0.58 0.58  CALCIUM 8.2* 8.5   Liver Function Tests:  Recent Labs Lab 11/04/13 1307 11/05/13 0518  AST 31 27  ALT 22 25  ALKPHOS 66 68  BILITOT 0.3 0.3  PROT 5.7* 6.1  ALBUMIN 2.1* 2.3*   CBC:  Recent Labs Lab 11/04/13 1211 11/05/13 0518  WBC 7.0 4.8  NEUTROABS 5.5  --   HGB 13.5 12.5*  HCT 38.6* 37.8*  MCV 84.5 87.1  PLT 162 119*   BNP (last 3 results)  Recent Labs  10/08/13 1313 10/14/13 1330  PROBNP 206.1* 169.4*   CBG:  Recent Labs Lab 11/04/13 1717 11/04/13 1820 11/04/13 2136  GLUCAP 297* 263* 191*   Studies: Dg Chest 2 View  11/04/2013   CLINICAL DATA:  Dysphagia  EXAM: CHEST  2 VIEW  COMPARISON:  10/14/2013  FINDINGS: There are mild bilateral chronic bronchitic changes. There is no focal  parenchymal opacity, pleural effusion, or pneumothorax. The heart and mediastinal contours are unremarkable.  The osseous structures are unremarkable.  IMPRESSION: No active cardiopulmonary disease.   Electronically Signed   By: Elige Ko   On: 11/04/2013 14:42   Ct Soft Tissue Neck W Contrast  11/04/2013   CLINICAL DATA:  Three days of dysphagia  EXAM: CT NECK WITH CONTRAST  TECHNIQUE: Multidetector CT imaging of the neck was performed using the standard protocol following the bolus administration of intravenous contrast.  CONTRAST:  OMNIPAQUE IOHEXOL 300 MG/ML  SOLN  COMPARISON:  Soft tissue radiographs of the neck of October 04, 2013  FINDINGS: There is opacification of the visualized portions of the right frontal sinus. The other paranasal sinuses are clear. The nasal passages are patent. The tonsillar soft tissues are normal. There is mild prominence of the soft tissues of the posterior wall of the oropharynx on the right beginning on images 43 and extending through image 60. No enhancing mass or soft tissue gas collections aredemonstrated here. The soft tissues of the tongue are normal. The epiglottis is normal.  The prevertebral soft tissues are normal in density. The laryngeal structures are normal. There is a 6 mm diameter hypodense nodule in the right hepatic lobe but there other areas of poorly defined hypodensity in both thyroid lobes. There is no mass effect upon the trachea nor of the upper thoracic esophagus. No foreign bodies are demonstrated.  The parotid and submandibular glands are normal. The jugular and carotid vessels are normal. There is no lymphadenopathy. The sternocleidomastoid muscles are normal.  There is mild degenerative disc space narrowing at C5-6 with anterior and posterior osteophytes. The facets are normal. The mandible and temporomandibular joints are normal. The pulmonary apices are clear.  IMPRESSION: 1. There is subtle soft tissue fullness to the right of midline  involving the posterior oropharyngeal wall without a discrete mass. Direct visualization is recommended. 2. The hypopharynx and larynx and upper esophagus are normal. 3. The thyroid gland is normal sized but its echotexture is heterogeneous. Elective thyroid ultrasound is recommended. 4. Probable right frontal sinus inflammation.   Electronically Signed   By: David  [place]   On: 11/04/2013 15:23    Scheduled Meds: . enalapril  1.25 mg Oral Daily  . insulin aspart  0-9 Units Subcutaneous TID WC  . insulin glargine  10 Units Subcutaneous Daily  . nystatin  5 mL Oral QID  . predniSONE  60 mg Oral BID WC   Continuous Infusions: . sodium chloride 75 mL/hr at 11/05/13 0128    Active Problems:   Protein-calorie malnutrition, severe   Hyperglycemia   Psoriatic arthritis   FTT (failure to thrive) in adult   Psoriasis   Dysphagia   Time spent: 35  This note has been created with Dragon speech recognition software  and smart Lobbyist. Any transcriptional errors are unintentional.   Pamella Pert, MD [facility] Pager [phone]. If 7 PM - 7 AM, please contact night-coverage at [email or web address], password [facility] 11/05/2013, 9:08 AM  LOS: 1 day

## 2013-11-05 NOTE — Evaluation (Signed)
SLP Cancellation Note  Patient Details Name: Paola Flynt MRN: [id number] DOB: [birth year]   Cancelled treatment:       Reason Eval/Treat Not Completed:  (order for MBS in chart suspect in error given ENT note requested barium swallow, please reorder SLP if indicated, radiology made aware)   Donavan Burnet, MS [facility] SLP [phone]

## 2013-11-06 LAB — CBC
HCT: 33.9 % — ABNORMAL LOW (ref 39.0–52.0)
HEMOGLOBIN: 11.6 g/dL — AB (ref 13.0–17.0)
MCH: 29.7 pg (ref 26.0–34.0)
MCHC: 34.2 g/dL (ref 30.0–36.0)
MCV: 86.9 fL (ref 78.0–100.0)
Platelets: 109 10*3/uL — ABNORMAL LOW (ref 150–400)
RBC: 3.9 MIL/uL — AB (ref 4.22–5.81)
RDW: 16.2 % — ABNORMAL HIGH (ref 11.5–15.5)
WBC: 4.1 10*3/uL (ref 4.0–10.5)

## 2013-11-06 LAB — GLUCOSE, CAPILLARY
GLUCOSE-CAPILLARY: 349 mg/dL — AB (ref 70–99)
Glucose-Capillary: 347 mg/dL — ABNORMAL HIGH (ref 70–99)
Glucose-Capillary: 264 mg/dL — ABNORMAL HIGH (ref 70–99)
Glucose-Capillary: 513 mg/dL — ABNORMAL HIGH (ref 70–99)
Glucose-Capillary: 482 mg/dL — ABNORMAL HIGH (ref 70–99)

## 2013-11-06 LAB — COMPREHENSIVE METABOLIC PANEL
ALBUMIN: 2 g/dL — AB (ref 3.5–5.2)
ALT: 28 U/L (ref 0–53)
ANION GAP: 13 (ref 5–15)
AST: 28 U/L (ref 0–37)
Alkaline Phosphatase: 72 U/L (ref 39–117)
BILIRUBIN TOTAL: 0.2 mg/dL — AB (ref 0.3–1.2)
BUN: 21 mg/dL (ref 6–23)
CHLORIDE: 102 meq/L (ref 96–112)
CO2: 24 mEq/L (ref 19–32)
CREATININE: 0.64 mg/dL (ref 0.50–1.35)
Calcium: 8.2 mg/dL — ABNORMAL LOW (ref 8.4–10.5)
GFR calc non Af Amer: >90 mL/min (ref >90)
Glucose, Bld: 372 mg/dL — ABNORMAL HIGH (ref 70–99)
Potassium: 4.4 mEq/L (ref 3.7–5.3)
Sodium: 139 mEq/L (ref 137–147)
TOTAL PROTEIN: 5.6 g/dL — AB (ref 6.0–8.3)

## 2013-11-06 LAB — GLUCOSE, RANDOM: Glucose, Bld: 523 mg/dL — ABNORMAL HIGH (ref 70–99)

## 2013-11-06 MED ORDER — CHLORHEXIDINE GLUCONATE 0.12 % MT SOLN
15.0000 mL | Freq: Two times a day (BID) | OROMUCOSAL | Status: DC
  Administered 2013-11-06 – 2013-11-07 (×2): 15 mL via OROMUCOSAL
  Filled 2013-11-06 (×4): qty 15

## 2013-11-06 MED ORDER — OXYCODONE HCL 5 MG PO TABS
10.0000 mg | ORAL_TABLET | ORAL | Status: DC | PRN
  Administered 2013-11-06 – 2013-11-08 (×6): 10 mg via ORAL
  Filled 2013-11-06 (×7): qty 2

## 2013-11-06 MED ORDER — BOOST / RESOURCE BREEZE PO LIQD
1.0000 | Freq: Three times a day (TID) | ORAL | Status: DC
  Administered 2013-11-06 (×2): 1 via ORAL

## 2013-11-06 MED ORDER — INSULIN ASPART 100 UNIT/ML ~~LOC~~ SOLN
3.0000 [IU] | Freq: Three times a day (TID) | SUBCUTANEOUS | Status: DC
  Administered 2013-11-06: 3 [IU] via SUBCUTANEOUS

## 2013-11-06 MED ORDER — BIOTENE DRY MOUTH MT LIQD: 15.0000 mL | Freq: Two times a day (BID) | OROMUCOSAL | Status: DC

## 2013-11-06 MED ORDER — GI COCKTAIL ~~LOC~~
30.0000 mL | Freq: Three times a day (TID) | ORAL | Status: DC | PRN
  Administered 2013-11-06 – 2013-11-07 (×5): 30 mL via ORAL
  Filled 2013-11-06 (×7): qty 30

## 2013-11-06 MED ORDER — INSULIN GLARGINE 100 UNIT/ML ~~LOC~~ SOLN
15.0000 [IU] | Freq: Every day | SUBCUTANEOUS | Status: DC
  Administered 2013-11-07: 15 [IU] via SUBCUTANEOUS
  Filled 2013-11-06 (×2): qty 0.15

## 2013-11-06 MED ORDER — INSULIN ASPART 100 UNIT/ML ~~LOC~~ SOLN
5.0000 [IU] | Freq: Three times a day (TID) | SUBCUTANEOUS | Status: DC
  Administered 2013-11-06 – 2013-11-07 (×2): 5 [IU] via SUBCUTANEOUS

## 2013-11-06 NOTE — ED Provider Notes (Signed)
Medical screening examination/treatment/procedure(s) were conducted as a shared visit with non-physician practitioner(s) and myself.  I personally evaluated the patient during the encounter.  Hx psoriatic arthritis with progressive dysphagia and inability to swallow x2  Days.  Feels food getting stuck but no vomiting. Can drink and take meds with pain. No distress, hoarse voice, no drooling.  No asymmetry of oropharynx.  Controlling secretions.   EKG Interpretation None       Glynn Octave, MD 11/06/13 1414

## 2013-11-06 NOTE — Progress Notes (Signed)
INITIAL NUTRITION ASSESSMENT  DOCUMENTATION CODES Per approved criteria  -Severe malnutrition in the context of chronic illness   INTERVENTION: -Resource Breeze po TID, each supplement provides 250 kcal and 9 grams of protein Patient requested and likes -Continue regular diet.  Patient to choose what he can tolerate.  He chops it up himself to make it easier to swallow. -RD to monitor.  NUTRITION DIAGNOSIS: Inadequate oral intake related to difficulty swallowing as evidenced by patient report.   Goal: Intake of meals and supplements to meet >90% estimated needs.  Monitor:  Intake, labs, weight trend,  Reason for Assessment: Consult for evaluate patient's status and needs.  58 y.o. male  Admitting Dx: <principal problem not specified>  ASSESSMENT: Patient admitted with Odynophagia.  Seen by ENT.  Hx includes psoriatic arthritis and seen last admit 10/17/13.  Patient with severe malnutrition initially diagnosed one month ago.    Fair intake prior to admit. Weight stable from last admit [employer] and requested this.  Nutrition Focused Physical Exam:  Subcutaneous Fat:  Orbital Region: wnl Upper Arm Region: moderate Thoracic and Lumbar Region: wnl  Muscle:  Temple Region: moderate Clavicle Bone Region: wnl Clavicle and Acromion Bone Region: mild Scapular Bone Region: sefere Dorsal Hand: mild Patellar Region: severe Anterior Thigh Region: severe Posterior Calf Region: severe  Edema: none noted      Height: Ht Readings from Last 1 Encounters:  11/05/13 5' 11.5" (1.816 m)    Weight: Wt Readings from Last 1 Encounters:  11/05/13 158 lb 8 oz (71.895 kg)    Ideal Body Weight: 172  % Ideal Body Weight: 92  Wt Readings from Last 10 Encounters:  11/05/13 158 lb 8 oz (71.895 kg)  10/24/13 161 lb (73.029 kg)  10/14/13 159 lb 9.6 oz (72.394 kg)  10/08/13 161 lb 13.1 oz (73.4 kg)  10/05/13 162 lb 8 oz (73.71 kg)    Usual Body Weight: 190  lbd  % Usual Body Weight: 83  BMI:  Body mass index is 21.8 kg/(m^2).  Estimated Nutritional Needs: Kcal: 1850-2150 Protein: 85-100 gms Fluid: 2.2L daily  Skin: psoriatic skin  Diet Order: General  EDUCATION NEEDS: -Education needs addressed   Intake/Output Summary (Last 24 hours) at 11/06/13 1459 Last data filed at 11/06/13 1422  Gross per 24 hour  Intake 2787.5 ml  Output   4925 ml  Net -2137.5 ml    Labs:   Recent Labs Lab 11/04/13 1307 11/05/13 0518 11/06/13 0415  NA 136* 138 139  K 4.3 3.8 4.4  CL 102 103 102  CO2 21 24 24   BUN 13 11 21   CREATININE 0.58 0.58 0.64  CALCIUM 8.2* 8.5 8.2*  GLUCOSE 259* 278* 372*    CBG (last 3)   Recent Labs  11/05/13 2120 11/06/13 0739 11/06/13 1156  GLUCAP 318* 347* 264*    Scheduled Meds: . enalapril  1.25 mg Oral Daily  . enoxaparin (LOVENOX) injection  40 mg Subcutaneous Q24H  . insulin aspart  0-20 Units Subcutaneous TID WC  . insulin aspart  0-5 Units Subcutaneous QHS  . insulin aspart  3 Units Subcutaneous TID WC  . [START ON 11/07/2013] insulin glargine  15 Units Subcutaneous Daily  . nystatin  5 mL Oral QID  . predniSONE  60 mg Oral BID WC    Continuous Infusions: . sodium chloride 10 mL/hr at 11/06/13 1225    Past Medical History  Diagnosis Date  . Allergic reaction   . Sinus disease   .  Back pain   . Arthritis   . Edema   . Hypertension   . Diabetes mellitus without complication     insulin/metformin    Past Surgical History  Procedure Laterality Date  . No past surgeries      Oran Rein, RD, LDN Clinical Inpatient Dietitian Pager:  [phone] Weekend and after hours pager:  [phone]

## 2013-11-06 NOTE — Progress Notes (Signed)
Subjective: Pt still c/o difficulty with his swallowing. No new issues  Objective: Vital signs in last 24 hours: Temp:  [97.4 F (36.3 C)-98.4 F (36.9 C)] 98.4 F (36.9 C) (07/05 0419) Pulse Rate:  [88-93] 92 (07/05 0419) Resp:  [18] 18 (07/05 0419) BP: (116-129)/(66-92) 129/92 mmHg (07/05 0419) SpO2:  [99 %-100 %] 100 % (07/05 0419) Weight:  [158 lb 8 oz (71.895 kg)] 158 lb 8 oz (71.895 kg) (07/04 2244)  Physical Exam  Constitutional: He is oriented to person, place, and time. He appears well-developed. No distress.  Head: Normocephalic.  Ears: Normal auricles and EACs.  Nose: Normal mucosa and septum. Mouth/Throat: Oropharynx is clear and moist. No mass or lesion noted. Psoarsis plaques noted on patient's scalp.  Eyes: EOM are normal. Pupils are equal, round, and reactive to light.  Neck: Normal range of motion. Neck supple.  Cardiovascular: Regular rhythm and normal heart sounds.  Neurological: He is alert and oriented to person, place, and time.  Skin: Skin is warm and dry.    Recent Labs  11/05/13 0518 11/06/13 0415  WBC 4.8 4.1  HGB 12.5* 11.6*  HCT 37.8* 33.9*  PLT 119* 109*    Recent Labs  11/05/13 0518 11/06/13 0415  NA 138 139  K 3.8 4.4  CL 103 102  CO2 24 24  GLUCOSE 278* 372*  BUN 11 21  CREATININE 0.58 0.64  CALCIUM 8.5 8.2*    Medications:  I have reviewed the patient's current medications. Scheduled: . enalapril  1.25 mg Oral Daily  . enoxaparin (LOVENOX) injection  40 mg Subcutaneous Q24H  . insulin aspart  0-20 Units Subcutaneous TID WC  . insulin aspart  0-5 Units Subcutaneous QHS  . insulin glargine  10 Units Subcutaneous Daily  . nystatin  5 mL Oral QID  . predniSONE  60 mg Oral BID WC   XKG:YJEHUDJSHFWYO (DILAUDID) injection, menthol-cetylpyridinium  Assessment/Plan: Dysphagia of unknown etiology. However, I witness the patient eating a bowl of grits and drinking his coffee without difficulty. Awaiting his Barium swallow study.  Continue prednisone.   LOS: 2 days   Magdalina Whitehead,SUI W 11/06/2013, 8:21 AM

## 2013-11-06 NOTE — Progress Notes (Signed)
PROGRESS NOTE  Lawrence Jennings [id number]:[id number] DOB: October 28, 1955 DOA: 11/04/2013 PCP: Lawrence Lewandowsky, MD  HPI: Lawrence Jennings is a 58 y.o. male has a past medical history significant for HTN, DM, tobacco abuse, hospitalized 3 times in the past 2 months with rash and arthritis ultimately diagnosed with psoriatic arthritis followed by Dr. Dierdre Jennings, on Prednisone and Humira currently He presents to the ED today with a week long, progressive inability to swallow. He described pain on the right side of his neck with swallowing as well as difficulties with swallowing. Feels like something "is stuck" in his throat and is barely able to drink or eat food even after chopping it. At times he feels like he is barely able to handle his own saliva. Endorses regurgitation also. He denies fever or chills, has no cough/chest congestion. This has been going on for a week, saw his PCPfew days ago and received lidocaine solution but does not feel like its helping.   Assessment/Plan: Odynophagia - CT scan with subtle soft tissue fullness,  - ENT consulted and Dr. Suszanne Jennings did a flexible endoscopy 7/4 am and was without suspicious mass or lesion, ?autoimmune/inflammatory changes of the of the prevertebral fascia - does not look infectious, afebrile, no leukocytosis  - this worsened after steroid taper, will go back up to 60 daily and monitor - did well with chopped diet yesterday, allow regular diet, plan for barium study tomorrow.  - add GI cocktail today  Psoriatic arthritis - continue Prednisone. He got his weekly Humira the day prior to admission  DM - uptitrating his insulin regimen while on steroids.  Malnutrition - consult nutrition Thrombocytopenia - ?etiology, dilutional, all lines down, will repeat in am, if < 100 d/c Lovenox  Diet: chopped Fluids: none DVT Prophylaxis: Lovenox  Code Status: Full Family Communication: d/w patient  Disposition Plan: inpatient, home  24-48h  Consultants:  ENT  Procedures:  Flexible larongoscopy 7/4   Antibiotics - none   HPI/Subjective: Ongoing neck pain, improved  Objective: Filed Vitals:   11/05/13 1900 11/05/13 2117 11/05/13 2244 11/06/13 0419  BP:  116/66  129/92  Pulse:  93  92  Temp:  97.4 F (36.3 C)  98.4 F (36.9 C)  TempSrc:  Oral  Oral  Resp:  18  18  Height: 5' 11.5" (1.816 m)     Weight:   71.895 kg (158 lb 8 oz)   SpO2:  99%  100%    Intake/Output Summary (Last 24 hours) at 11/06/13 1017 Last data filed at 11/06/13 0835  Gross per 24 hour  Intake 1827.5 ml  Output   4075 ml  Net -2247.5 ml   Filed Weights   11/05/13 2244  Weight: 71.895 kg (158 lb 8 oz)    Exam:   General:  NAD  Cardiovascular: regular rate and rhythm, without MRG  Respiratory: good air movement, clear to auscultation throughout, no wheezing, ronchi or rales  Abdomen: soft, not tender to palpation, positive bowel sounds  MSK: no peripheral edema Neuro: non focal  Data Reviewed: Basic Metabolic Panel:  Recent Labs Lab 11/04/13 1307 11/05/13 0518 11/06/13 0415  NA 136* 138 139  K 4.3 3.8 4.4  CL 102 103 102  CO2 21 24 24   GLUCOSE 259* 278* 372*  BUN 13 11 21   CREATININE 0.58 0.58 0.64  CALCIUM 8.2* 8.5 8.2*   Liver Function Tests:  Recent Labs Lab 11/04/13 1307 11/05/13 0518 11/06/13 0415  AST 31 27 28   ALT 22 25 28  ALKPHOS 66 68 72  BILITOT 0.3 0.3 0.2*  PROT 5.7* 6.1 5.6*  ALBUMIN 2.1* 2.3* 2.0*   CBC:  Recent Labs Lab 11/04/13 1211 11/05/13 0518 11/06/13 0415  WBC 7.0 4.8 4.1  NEUTROABS 5.5  --   --   HGB 13.5 12.5* 11.6*  HCT 38.6* 37.8* 33.9*  MCV 84.5 87.1 86.9  PLT 162 119* 109*   BNP (last 3 results)  Recent Labs  10/08/13 1313 10/14/13 1330  PROBNP 206.1* 169.4*   CBG:  Recent Labs Lab 11/05/13 0909 11/05/13 1227 11/05/13 1733 11/05/13 2120 11/06/13 0739  GLUCAP 200* 331* 334* 318* 347*   Studies: Dg Chest 2 View  11/04/2013   CLINICAL  DATA:  Dysphagia  EXAM: CHEST  2 VIEW  COMPARISON:  10/14/2013  FINDINGS: There are mild bilateral chronic bronchitic changes. There is no focal parenchymal opacity, pleural effusion, or pneumothorax. The heart and mediastinal contours are unremarkable.  The osseous structures are unremarkable.  IMPRESSION: No active cardiopulmonary disease.   Electronically Signed   By: Lawrence Jennings   On: 11/04/2013 14:42   Ct Soft Tissue Neck W Contrast  11/04/2013   CLINICAL DATA:  Three days of dysphagia  EXAM: CT NECK WITH CONTRAST  TECHNIQUE: Multidetector CT imaging of the neck was performed using the standard protocol following the bolus administration of intravenous contrast.  CONTRAST:  OMNIPAQUE IOHEXOL 300 MG/ML  SOLN  COMPARISON:  Soft tissue radiographs of the neck of October 04, 2013  FINDINGS: There is opacification of the visualized portions of the right frontal sinus. The other paranasal sinuses are clear. The nasal passages are patent. The tonsillar soft tissues are normal. There is mild prominence of the soft tissues of the posterior wall of the oropharynx on the right beginning on images 43 and extending through image 60. No enhancing mass or soft tissue gas collections aredemonstrated here. The soft tissues of the tongue are normal. The epiglottis is normal.  The prevertebral soft tissues are normal in density. The laryngeal structures are normal. There is a 6 mm diameter hypodense nodule in the right hepatic lobe but there other areas of poorly defined hypodensity in both thyroid lobes. There is no mass effect upon the trachea nor of the upper thoracic esophagus. No foreign bodies are demonstrated.  The parotid and submandibular glands are normal. The jugular and carotid vessels are normal. There is no lymphadenopathy. The sternocleidomastoid muscles are normal.  There is mild degenerative disc space narrowing at C5-6 with anterior and posterior osteophytes. The facets are normal. The mandible and  temporomandibular joints are normal. The pulmonary apices are clear.  IMPRESSION: 1. There is subtle soft tissue fullness to the right of midline involving the posterior oropharyngeal wall without a discrete mass. Direct visualization is recommended. 2. The hypopharynx and larynx and upper esophagus are normal. 3. The thyroid gland is normal sized but its echotexture is heterogeneous. Elective thyroid ultrasound is recommended. 4. Probable right frontal sinus inflammation.   Electronically Signed   By: David  [place]   On: 11/04/2013 15:23    Scheduled Meds: . enalapril  1.25 mg Oral Daily  . enoxaparin (LOVENOX) injection  40 mg Subcutaneous Q24H  . insulin aspart  0-20 Units Subcutaneous TID WC  . insulin aspart  0-5 Units Subcutaneous QHS  . insulin aspart  3 Units Subcutaneous TID WC  . [START ON 11/07/2013] insulin glargine  15 Units Subcutaneous Daily  . nystatin  5 mL Oral QID  . predniSONE  60 mg Oral BID WC   Continuous Infusions: . sodium chloride 75 mL/hr at 11/05/13 1507    Active Problems:   Protein-calorie malnutrition, severe   Hyperglycemia   Psoriatic arthritis   FTT (failure to thrive) in adult   Psoriasis   Dysphagia   Time spent: 25  This note has been created with Education officer, environmental. Any transcriptional errors are unintentional.   Pamella Pert, MD [facility] Pager [phone]. If 7 PM - 7 AM, please contact night-coverage at [email or web address], password [facility] 11/06/2013, 10:17 AM  LOS: 2 days

## 2013-11-07 ENCOUNTER — Inpatient Hospital Stay (HOSPITAL_COMMUNITY): Payer: Medicaid Other

## 2013-11-07 LAB — GLUCOSE, CAPILLARY
GLUCOSE-CAPILLARY: 441 mg/dL — AB (ref 70–99)
GLUCOSE-CAPILLARY: 136 mg/dL — AB (ref 70–99)
GLUCOSE-CAPILLARY: 366 mg/dL — AB (ref 70–99)
Glucose-Capillary: 438 mg/dL — ABNORMAL HIGH (ref 70–99)

## 2013-11-07 LAB — BASIC METABOLIC PANEL
Anion gap: 14 (ref 5–15)
BUN: 19 mg/dL (ref 6–23)
CALCIUM: 8.7 mg/dL (ref 8.4–10.5)
CO2: 22 mEq/L (ref 19–32)
Chloride: 98 mEq/L (ref 96–112)
Creatinine, Ser: 0.68 mg/dL (ref 0.50–1.35)
Glucose, Bld: 510 mg/dL — ABNORMAL HIGH (ref 70–99)
POTASSIUM: 4 meq/L (ref 3.7–5.3)
SODIUM: 134 meq/L — AB (ref 137–147)

## 2013-11-07 LAB — CBC
HEMATOCRIT: 36.1 % — AB (ref 39.0–52.0)
Hemoglobin: 12.4 g/dL — ABNORMAL LOW (ref 13.0–17.0)
MCH: 29.1 pg (ref 26.0–34.0)
MCHC: 34.3 g/dL (ref 30.0–36.0)
MCV: 84.7 fL (ref 78.0–100.0)
Platelets: 126 10*3/uL — ABNORMAL LOW (ref 150–400)
RBC: 4.26 MIL/uL (ref 4.22–5.81)
RDW: 15.9 % — ABNORMAL HIGH (ref 11.5–15.5)
WBC: 5.4 10*3/uL (ref 4.0–10.5)

## 2013-11-07 MED ORDER — INSULIN ASPART 100 UNIT/ML ~~LOC~~ SOLN
4.0000 [IU] | Freq: Three times a day (TID) | SUBCUTANEOUS | Status: DC
  Administered 2013-11-08: 4 [IU] via SUBCUTANEOUS

## 2013-11-07 MED ORDER — INSULIN ASPART 100 UNIT/ML ~~LOC~~ SOLN
8.0000 [IU] | Freq: Three times a day (TID) | SUBCUTANEOUS | Status: DC
  Administered 2013-11-07: 8 [IU] via SUBCUTANEOUS

## 2013-11-07 MED ORDER — PREDNISONE 50 MG PO TABS
60.0000 mg | ORAL_TABLET | Freq: Every day | ORAL | Status: DC
  Administered 2013-11-08: 60 mg via ORAL
  Filled 2013-11-07 (×2): qty 1

## 2013-11-07 MED ORDER — SIMETHICONE 80 MG PO CHEW
80.0000 mg | CHEWABLE_TABLET | Freq: Four times a day (QID) | ORAL | Status: DC | PRN
  Administered 2013-11-07: 80 mg via ORAL
  Filled 2013-11-07: qty 1

## 2013-11-07 MED ORDER — BACITRACIN-NEOMYCIN-POLYMYXIN 400-5-5000 EX OINT
TOPICAL_OINTMENT | CUTANEOUS | Status: AC
  Filled 2013-11-07: qty 1

## 2013-11-07 NOTE — Progress Notes (Signed)
Physician paged regarding cbg [phone]. Will give 20 units of sliding scale and 5 units of meal coverage and will continue to monitor. No new orders at this time.

## 2013-11-07 NOTE — Progress Notes (Signed)
PROGRESS NOTE  Lawrence Jennings JGG:[id number] DOB: 30-Jul-1955 DOA: 11/04/2013 PCP: Jeanann Lewandowsky, MD  HPI: Lawrence Jennings is a 58 y.o. male has a past medical history significant for HTN, DM, tobacco abuse, hospitalized 3 times in the past 2 months with rash and arthritis ultimately diagnosed with psoriatic arthritis followed by Dr. Dierdre Forth, on Prednisone and Humira currently He presents to the ED today with a week long, progressive inability to swallow. He described pain on the right side of his neck with swallowing as well as difficulties with swallowing. Feels like something "is stuck" in his throat and is barely able to drink or eat food even after chopping it. At times he feels like he is barely able to handle his own saliva. Endorses regurgitation also. He denies fever or chills, has no cough/chest congestion. This has been going on for a week, saw his PCPfew days ago and received lidocaine solution but does not feel like its helping.   Assessment/Plan: Odynophagia - CT scan with subtle soft tissue fullness,  - ENT consulted and Dr. Suszanne Conners did a flexible endoscopy 7/4 am and was without suspicious mass or lesion, ?autoimmune/inflammatory changes of the of the prevertebral fascia - does not look infectious, afebrile, no leukocytosis  - this worsened after steroid taper, will go back up to 60 daily and monitor - is able to eat well - ordered barium swallow last week but radiology could not complete the study over the weekend, awaiting study to be done Psoriatic arthritis - continue Prednisone. He got his weekly Humira the day prior to admission  DM - uptitrating his insulin regimen while on steroids.  - unfortunately patient ate doughnuts yesterday brought by a friend Malnutrition - consult nutrition Thrombocytopenia - ?etiology, dilutional, all lines down,  - resolving  Diet: chopped Fluids: none DVT Prophylaxis: Lovenox  Code Status: Full Family Communication: d/w patient    Disposition Plan: inpatient, home 24 h  Consultants:  ENT  Procedures:  Flexible larongoscopy 7/4   Antibiotics - none   HPI/Subjective: Ongoing neck pain, improved  Objective: Filed Vitals:   11/06/13 0419 11/06/13 1422 11/06/13 2050 11/07/13 0507  BP: 129/92 140/83 150/83 150/86  Pulse: 92 87 92 68  Temp: 98.4 F (36.9 C) 98.1 F (36.7 C) 97.4 F (36.3 C) 97.4 F (36.3 C)  TempSrc: Oral Oral Oral Oral  Resp: 18 18 18 18   Height:      Weight:      SpO2: 100% 99% 100% 97%    Intake/Output Summary (Last 24 hours) at 11/07/13 1151 Last data filed at 11/07/13 1109  Gross per 24 hour  Intake    720 ml  Output   6050 ml  Net  -5330 ml   Filed Weights   11/05/13 2244  Weight: 71.895 kg (158 lb 8 oz)    Exam:   General:  NAD  Cardiovascular: regular rate and rhythm, without MRG  Respiratory: good air movement, clear to auscultation throughout, no wheezing, ronchi or rales  Abdomen: soft, not tender to palpation, positive bowel sounds  MSK: no peripheral edema  Neuro: non focal  Data Reviewed: Basic Metabolic Panel:  Recent Labs Lab 11/04/13 1307 11/05/13 0518 11/06/13 0415 11/06/13 1741 11/07/13 1000  NA 136* 138 139  --  134*  K 4.3 3.8 4.4  --  4.0  CL 102 103 102  --  98  CO2 21 24 24   --  22  GLUCOSE 259* 278* 372* 523* 510*  BUN 13 11 21   --  19  CREATININE 0.58 0.58 0.64  --  0.68  CALCIUM 8.2* 8.5 8.2*  --  8.7   Liver Function Tests:  Recent Labs Lab 11/04/13 1307 11/05/13 0518 11/06/13 0415  AST 31 27 28   ALT 22 25 28   ALKPHOS 66 68 72  BILITOT 0.3 0.3 0.2*  PROT 5.7* 6.1 5.6*  ALBUMIN 2.1* 2.3* 2.0*   CBC:  Recent Labs Lab 11/04/13 1211 11/05/13 0518 11/06/13 0415 11/07/13 0432  WBC 7.0 4.8 4.1 5.4  NEUTROABS 5.5  --   --   --   HGB 13.5 12.5* 11.6* 12.4*  HCT 38.6* 37.8* 33.9* 36.1*  MCV 84.5 87.1 86.9 84.7  PLT 162 119* 109* 126*   BNP (last 3 results)  Recent Labs  10/08/13 1313 10/14/13 1330   PROBNP 206.1* 169.4*   CBG:  Recent Labs Lab 11/06/13 1156 11/06/13 1656 11/06/13 1849 11/06/13 2048 11/07/13 0728  GLUCAP 264* 513* 482* 349* 441*   Studies: No results found.  Scheduled Meds: . enalapril  1.25 mg Oral Daily  . enoxaparin (LOVENOX) injection  40 mg Subcutaneous Q24H  . insulin aspart  0-20 Units Subcutaneous TID WC  . insulin aspart  0-5 Units Subcutaneous QHS  . insulin aspart  5 Units Subcutaneous TID WC  . insulin glargine  15 Units Subcutaneous Daily  . neomycin-bacitracin-polymyxin      . nystatin  5 mL Oral QID  . [START ON 11/08/2013] predniSONE  60 mg Oral Q breakfast   Continuous Infusions: . sodium chloride 10 mL/hr at 11/06/13 1225    Active Problems:   Protein-calorie malnutrition, severe   Hyperglycemia   Psoriatic arthritis   FTT (failure to thrive) in adult   Psoriasis   Dysphagia   Time spent: 25  This note has been created with 01/09/2014. Any transcriptional errors are unintentional.   01/07/14, MD [facility] Pager [phone]. If 7 PM - 7 AM, please contact night-coverage at [email or web address], password [facility] 11/07/2013, 11:51 AM  LOS: 3 days

## 2013-11-08 LAB — GLUCOSE, CAPILLARY: Glucose-Capillary: 312 mg/dL — ABNORMAL HIGH (ref 70–99)

## 2013-11-08 MED ORDER — INSULIN GLARGINE 100 UNIT/ML ~~LOC~~ SOLN: 15.0000 [IU] | Freq: Every day | SUBCUTANEOUS | Status: AC

## 2013-11-08 MED ORDER — NYSTATIN 100000 UNIT/ML MT SUSP: 5.0000 mL | Freq: Four times a day (QID) | OROMUCOSAL | Status: DC

## 2013-11-08 MED ORDER — PREDNISONE 20 MG PO TABS: 60.0000 mg | ORAL_TABLET | Freq: Every day | ORAL | Status: DC

## 2013-11-08 MED ORDER — GI COCKTAIL ~~LOC~~: 30.0000 mL | Freq: Three times a day (TID) | ORAL | Status: DC | PRN

## 2013-11-08 MED ORDER — DOXYCYCLINE HYCLATE 50 MG PO CAPS: 50.0000 mg | ORAL_CAPSULE | Freq: Two times a day (BID) | ORAL | Status: DC

## 2013-11-08 MED ORDER — SIMETHICONE 80 MG PO CHEW: 80.0000 mg | CHEWABLE_TABLET | Freq: Four times a day (QID) | ORAL | Status: AC | PRN

## 2013-11-08 MED ORDER — OXYCODONE HCL 10 MG PO TABS: 10.0000 mg | ORAL_TABLET | ORAL | Status: AC | PRN

## 2013-11-08 NOTE — Progress Notes (Signed)
Patient refused IV insertion,Dr. Elvera Lennox notified.- Hulda Marin RN

## 2013-11-08 NOTE — Discharge Summary (Signed)
Physician Discharge Summary  Lawrence Jennings WVP:[id number] DOB: 20-Oct-1955 DOA: 11/04/2013  PCP: Jeanann Lewandowsky, MD  Admit date: 11/04/2013 Discharge date: 11/08/2013  Time spent: 35 minutes  Recommendations for Outpatient Follow-up:  1. Follow up with Dr. Dierdre Forth in 2 days (patient has an appointment this Thursday) 2. Follow up with PCP in 1 week   Discharge Diagnoses:  Active Problems:   Protein-calorie malnutrition, severe   Hyperglycemia   Psoriatic arthritis   FTT (failure to thrive) in adult   Psoriasis   Dysphagia  Discharge Condition: stable  Diet recommendation: diabetic  Filed Weights   11/05/13 2244 11/08/13 0514  Weight: 71.895 kg (158 lb 8 oz) 71.396 kg (157 lb 6.4 oz)   History of present illness:  Lawrence Jennings is a 58 y.o. male has a past medical history significant for HTN, DM, tobacco abuse, hospitalized 3 times in the past 2 months with rash and arthritis ultimately diagnosed with psoriatic arthritis followed by Dr. Dierdre Forth, on Prednisone and Humira currently He presents to the ED today with a week long, progressive inability to swallow. He described pain on the right side of his neck with swallowing as well as difficulties with swallowing. Feels like something "is stuck" in his throat and is barely able to drink or eat food even after chopping it. At times he feels like he is barely able to handle his own saliva. Endorses regurgitation also. He denies fever or chills, has no cough/chest congestion. This has been going on for a week, saw his PCPfew days ago and received lidocaine solution but does not feel like its helping.   Hospital Course:  Odynophagia - CT scan with subtle soft tissue fullness, ENT consulted and Dr. Suszanne Conners did a flexible endoscopy 7/4 am and was without suspicious mass or lesion, ?autoimmune/inflammatory changes of the of the prevertebral fascia. Patient's prednisone was increased with improvement in his swallowing and was able to consume  100% of his meals and also eating food from the outside. He underwent a barium swallow which was negative.  Psoriatic arthritis - continue Prednisone. He got his weekly Humira the day prior to admission. He will be discharged on 60 mg of Prednisone and a taper. He established care with Dr. Dierdre Forth from Rheumatology and has an appointment this Thursday which he plans on keeping.  DM - uptitrating his insulin regimen while on steroids. Patient was non compliant with carb modified diet while hospitalized and ate food / donughts brought by friends. I added Lantus to his regimen and instructed CBGs at least 2 times per day, record a log and show to his PCP in 1 week.   Malnutrition - consult nutrition  Thrombocytopenia - had mild thrombocytopenia of unclear etiology, resolving on discharge. ?related to Humira. Recommend CBC as an outpatient Possible R 5th digit cellulitis - around that area where he got several CBGs checked, superficial erythema, will do a short course of Doxycycline. Advised PCP follow up.    Procedures:  None    Consultations:  ENT  Discharge Exam: Filed Vitals:   11/07/13 0507 11/07/13 1440 11/07/13 2107 11/08/13 0514  BP: 150/86 158/82 138/89 151/80  Pulse: 68 86 98 84  Temp: 97.4 F (36.3 C) 98.2 F (36.8 C) 98.2 F (36.8 C) 98 F (36.7 C)  TempSrc: Oral Oral Oral Oral  Resp: 18 18 20 18   Height:      Weight:    71.396 kg (157 lb 6.4 oz)  SpO2: 97% 98% 100% 100%   General:  NAD Cardiovascular: RRR Respiratory: CTA biL  Discharge Instructions    Medication List    STOP taking these medications       lidocaine 2 % solution  Commonly known as:  XYLOCAINE     lisinopril 5 MG tablet  Commonly known as:  PRINIVIL,ZESTRIL      TAKE these medications       doxycycline 50 MG capsule  Commonly known as:  VIBRAMYCIN  Take 1 capsule (50 mg total) by mouth 2 (two) times daily. For 5 days     enalapril 2.5 MG tablet  Commonly known as:  VASOTEC  Take 1.25  mg by mouth daily.     gi cocktail Susp suspension  Take 30 mLs by mouth 3 (three) times daily as needed for indigestion. Shake well.     HUMIRA 40 MG/0.8ML Pskt  Generic drug:  Adalimumab  Inject 40 mg into the skin once a week.     insulin aspart 100 UNIT/ML injection  Commonly known as:  novoLOG  Inject 0-15 Units into the skin 3 (three) times daily with meals.     insulin glargine 100 UNIT/ML injection  Commonly known as:  LANTUS  Inject 0.15 mLs (15 Units total) into the skin daily.     metFORMIN 500 MG tablet  Commonly known as:  GLUCOPHAGE  Take 1 tablet (500 mg total) by mouth daily with breakfast.     nystatin 100000 UNIT/ML suspension  Commonly known as:  MYCOSTATIN  Take 5 mLs (500,000 Units total) by mouth 4 (four) times daily.     Oxycodone HCl 10 MG Tabs  Take 1 tablet (10 mg total) by mouth every 4 (four) hours as needed for severe pain.     predniSONE 20 MG tablet  Commonly known as:  DELTASONE  Take 20 mg by mouth 2 (two) times daily with a meal.     predniSONE 20 MG tablet  Commonly known as:  DELTASONE  Take 3 tablets (60 mg total) by mouth daily with breakfast. 3 tablets daily for 3 days then 2 tablets daily for 3 days then 1 tablet daily.     simethicone 80 MG chewable tablet  Commonly known as:  MYLICON  Chew 1 tablet (80 mg total) by mouth every 6 (six) hours as needed for flatulence.           Follow-up Information   Follow up with Donnetta Hail, MD In 2 days.   Specialty:  Rheumatology   Contact information:   [address]  [place] [address] [phone]       Follow up with Jeanann Lewandowsky, MD. Schedule an appointment as soon as possible for a visit in 1 week.   Specialty:  Internal Medicine   Contact information:   [address] [place] [address] [phone]       The results of significant diagnostics from this hospitalization (including imaging, microbiology, ancillary and laboratory) are  listed below for reference.    Significant Diagnostic Studies: Dg Chest 2 View  11/04/2013   CLINICAL DATA:  Dysphagia  EXAM: CHEST  2 VIEW  COMPARISON:  10/14/2013  FINDINGS: There are mild bilateral chronic bronchitic changes. There is no focal parenchymal opacity, pleural effusion, or pneumothorax. The heart and mediastinal contours are unremarkable.  The osseous structures are unremarkable.  IMPRESSION: No active cardiopulmonary disease.   Electronically Signed   By: Elige Ko   On: 11/04/2013 14:42   Dg Chest 2 View  10/14/2013  CLINICAL DATA:  Chest pain, shortness of breath, cough for 2 days, former smoker  EXAM: CHEST  2 VIEW  COMPARISON:  10/10/2013  FINDINGS: Normal heart size, mediastinal contours, and pulmonary vascularity.  Lungs hyperinflated with persistent accentuation of RIGHT basilar markings question infiltrate.  Minimal atelectasis at LEFT costophrenic angle.  Remaining lungs clear.  No pleural effusion or pneumothorax.  Few scattered endplate spurs lower thoracic spine.  IMPRESSION: Minimal persistent infiltrate at RIGHT lung base similar to 10/10/2013.  Minimal LEFT base atelectasis.   Electronically Signed   By: Ulyses Southward M.D.   On: 10/14/2013 14:10   Dg Chest 2 View  10/10/2013   CLINICAL DATA:  Followup airspace disease versus pneumonia  EXAM: CHEST  2 VIEW  COMPARISON:  10/08/2013  FINDINGS: Interval improvement in right lower lobe infiltrate which has nearly completely cleared and is most consistent with resolving pneumonia. No effusion. Negative for heart failure. Left lung remains clear.  IMPRESSION: Near complete resolution of right lower lobe infiltrate.   Electronically Signed   By: Marlan Palau M.D.   On: 10/10/2013 08:51   Ct Head Wo Contrast  10/10/2013   CLINICAL DATA:  History of lower extremity weakness.  Possible CVA.  EXAM: CT HEAD WITHOUT CONTRAST  TECHNIQUE: Contiguous axial images were obtained from the base of the skull through the vertex without  contrast.  COMPARISON:  10/08/2013  FINDINGS: No evidence for acute hemorrhage, mass lesion, midline shift, hydrocephalus or large infarct. Again noted are subtle low-density areas near the anterior limb of the right internal capsule. These findings are similar to the previous examination. There is extensive mucosal disease in the maxillary sinuses bilaterally and cannot exclude fluid within the maxillary sinuses. Right maxillary sinus walls are thickened and suggest chronic changes. There is diffuse mucosal disease in the right frontal sinus. No acute bone abnormality.  IMPRESSION: No acute intracranial abnormality.  Subtle low-density areas along the right internal capsule anterior limb. These findings are nonspecific but could represent lacunar disease of unknown age.  Mucosal disease in the maxillary sinuses and right frontal sinus.   Electronically Signed   By: Richarda Overlie M.D.   On: 10/10/2013 14:58   Ct Soft Tissue Neck W Contrast  11/04/2013   CLINICAL DATA:  Three days of dysphagia  EXAM: CT NECK WITH CONTRAST  TECHNIQUE: Multidetector CT imaging of the neck was performed using the standard protocol following the bolus administration of intravenous contrast.  CONTRAST:  OMNIPAQUE IOHEXOL 300 MG/ML  SOLN  COMPARISON:  Soft tissue radiographs of the neck of October 04, 2013  FINDINGS: There is opacification of the visualized portions of the right frontal sinus. The other paranasal sinuses are clear. The nasal passages are patent. The tonsillar soft tissues are normal. There is mild prominence of the soft tissues of the posterior wall of the oropharynx on the right beginning on images 43 and extending through image 60. No enhancing mass or soft tissue gas collections aredemonstrated here. The soft tissues of the tongue are normal. The epiglottis is normal.  The prevertebral soft tissues are normal in density. The laryngeal structures are normal. There is a 6 mm diameter hypodense nodule in the right hepatic  lobe but there other areas of poorly defined hypodensity in both thyroid lobes. There is no mass effect upon the trachea nor of the upper thoracic esophagus. No foreign bodies are demonstrated.  The parotid and submandibular glands are normal. The jugular and carotid vessels are normal. There is  no lymphadenopathy. The sternocleidomastoid muscles are normal.  There is mild degenerative disc space narrowing at C5-6 with anterior and posterior osteophytes. The facets are normal. The mandible and temporomandibular joints are normal. The pulmonary apices are clear.  IMPRESSION: 1. There is subtle soft tissue fullness to the right of midline involving the posterior oropharyngeal wall without a discrete mass. Direct visualization is recommended. 2. The hypopharynx and larynx and upper esophagus are normal. 3. The thyroid gland is normal sized but its echotexture is heterogeneous. Elective thyroid ultrasound is recommended. 4. Probable right frontal sinus inflammation.   Electronically Signed   By: David  [place]   On: 11/04/2013 15:23   Dg Esophagus  11/07/2013   CLINICAL DATA:  Difficulty swallowing.  EXAM: ESOPHOGRAM / BARIUM SWALLOW / BARIUM TABLET STUDY  TECHNIQUE: Combined double contrast and single contrast examination performed using effervescent crystals, thick barium liquid, and thin barium liquid. The patient was observed with fluoroscopy swallowing a 72mm barium sulphate tablet.  FLUOROSCOPY TIME:  1 min, 26 seconds.  COMPARISON:  None.  FINDINGS: Swallowing function appeared normal without aspiration or penetration. The esophagus also appears normal without stricture, mass or inflammatory change. No gastroesophageal reflux was elicited. No hiatal hernia is visualized. Esophageal motility is unremarkable. 13 mm barium tablet passed easily into the stomach.  IMPRESSION: Negative examination.   Electronically Signed   By: Drusilla Kanner M.D.   On: 11/07/2013 16:24   Labs: Basic Metabolic Panel:  Recent  Labs Lab 11/04/13 1307 11/05/13 0518 11/06/13 0415 11/06/13 1741 11/07/13 1000  NA 136* 138 139  --  134*  K 4.3 3.8 4.4  --  4.0  CL 102 103 102  --  98  CO2 21 24 24   --  22  GLUCOSE 259* 278* 372* 523* 510*  BUN 13 11 21   --  19  CREATININE 0.58 0.58 0.64  --  0.68  CALCIUM 8.2* 8.5 8.2*  --  8.7   Liver Function Tests:  Recent Labs Lab 11/04/13 1307 11/05/13 0518 11/06/13 0415  AST 31 27 28   ALT 22 25 28   ALKPHOS 66 68 72  BILITOT 0.3 0.3 0.2*  PROT 5.7* 6.1 5.6*  ALBUMIN 2.1* 2.3* 2.0*   CBC:  Recent Labs Lab 11/04/13 1211 11/05/13 0518 11/06/13 0415 11/07/13 0432  WBC 7.0 4.8 4.1 5.4  NEUTROABS 5.5  --   --   --   HGB 13.5 12.5* 11.6* 12.4*  HCT 38.6* 37.8* 33.9* 36.1*  MCV 84.5 87.1 86.9 84.7  PLT 162 119* 109* 126*   BNP: BNP (last 3 results)  Recent Labs  10/08/13 1313 10/14/13 1330  PROBNP 206.1* 169.4*   CBG:  Recent Labs Lab 11/07/13 0728 11/07/13 1108 11/07/13 1650 11/07/13 2109 11/08/13 0737  GLUCAP 441* 438* 136* 366* 312*    Signed:  Jeydan Barner  [facility] 11/08/2013, 1:46 PM

## 2013-11-08 NOTE — Discharge Instructions (Addendum)
Please see Dr. Dierdre Forth in 2 days as scheduled. You are currently on 60 mg Prednisone daily which will probably need to be tapered down. I will taper down your prednisone as instructed in the prednisone prescription however discuss with Dr. Dierdre Forth as well.   Pplease take Lantus 15 U nightly and check your sugars 2 times daily every morning before breakfast and alternate before lunch or dinner or at bedtime. Record your sugars and show them to your PCP in 1 week.  If any glucose reading is under 80 or above 300 call your Prim MD immediately. Follow Low glucose instructions for glucose under 80 as instructed.   Follow with Primary MD Jeanann Lewandowsky, MD and Dr. Dierdre Forth in 5-7 days   Get CBC, CMP checked by your doctor and again as further instructed.  Get a 2 view Chest X ray done next visit if you had Pneumonia of Lung problems at the Hospital.  Get Medicines reviewed and adjusted.  Please request your Prim.MD to go over all Hospital Tests and Procedure/Radiological results at the follow up, please get all Hospital records sent to your Prim MD by signing hospital release before you go home.  Activity: As tolerated with Full fall precautions use walker/cane & assistance as needed  Diet: diabetic  For Heart failure patients - Check your Weight same time everyday, if you gain over 2 pounds, or you develop in leg swelling, experience more shortness of breath or chest pain, call your Primary MD immediately. Follow Cardiac Low Salt Diet and 1.8 lit/day fluid restriction.  Disposition Home  If you experience worsening of your admission symptoms, develop shortness of breath, life threatening emergency, suicidal or homicidal thoughts you must seek medical attention immediately by calling 911 or calling your MD immediately  if symptoms less severe.  You Must read complete instructions/literature along with all the possible adverse reactions/side effects for all the Medicines you take and that have  been prescribed to you. Take any new Medicines after you have completely understood and accpet all the possible adverse reactions/side effects.   Do not drive and provide baby sitting services if your were admitted for syncope or siezures until you have seen by Primary MD or a Neurologist and advised to do so again.  Do not drive when taking Pain medications.   Do not take more than prescribed Pain, Sleep and Anxiety Medications  Special Instructions: If you have smoked or chewed Tobacco  in the last 2 yrs please stop smoking, stop any regular Alcohol  and or any Recreational drug use.  Wear Seat belts while driving.

## 2013-11-08 NOTE — Progress Notes (Signed)
Pt beckoned for me to come to his room. He was crying when I arrived saying he had pain and "they are letting me go."  Pt and I had a long visit during which he described experiences about which he was unhappy during his hospitalization. During our visit hospital physician visit w/o incident. Pt said he liked him. Pt said he had been referred to pt experience and I encouraged him to call w/any concerns. I tried to assure him that we [facility] wants to know about such experiences in order to provide the best care.  Pt owned some of his responsibility. He also said he is dealing with something he's never had before and he's in pain.  He said he just wants to know what the doctor is doing, what medications are being given to him and any changes that are made. He said he just wants to know that before.  He was upset that meds were changed and he didn't know. I provided empathic listening and concern for pt.  I tried to assure him his voice is heard.  Had prayer w/pt, upon request. Pt was tearful but thankful for time and visit. Marjory Lies Chaplain  11/08/13 1000  Clinical Encounter Type  Visited With Patient

## 2013-11-08 NOTE — Progress Notes (Signed)
Subjective: Pt refused to answer questions. Sitting in chair.  Looks comfortable.  Objective: Vital signs in last 24 hours: Temp:  [98 F (36.7 C)-98.2 F (36.8 C)] 98 F (36.7 C) (07/07 0514) Pulse Rate:  [84-98] 84 (07/07 0514) Resp:  [18-20] 18 (07/07 0514) BP: (138-158)/(80-89) 151/80 mmHg (07/07 0514) SpO2:  [98 %-100 %] 100 % (07/07 0514) Weight:  [157 lb 6.4 oz (71.396 kg)] 157 lb 6.4 oz (71.396 kg) (07/07 0514)  Pt refused to be examined.  Appears to be comfortable.  NAD.  Recent Labs  11/06/13 0415 11/07/13 0432  WBC 4.1 5.4  HGB 11.6* 12.4*  HCT 33.9* 36.1*  PLT 109* 126*    Recent Labs  11/06/13 0415 11/06/13 1741 11/07/13 1000  NA 139  --  134*  K 4.4  --  4.0  CL 102  --  98  CO2 24  --  22  GLUCOSE 372* 523* 510*  BUN 21  --  19  CREATININE 0.64  --  0.68  CALCIUM 8.2*  --  8.7    Medications:  I have reviewed the patient's current medications. Scheduled: . enalapril  1.25 mg Oral Daily  . enoxaparin (LOVENOX) injection  40 mg Subcutaneous Q24H  . insulin aspart  0-20 Units Subcutaneous TID WC  . insulin aspart  0-5 Units Subcutaneous QHS  . insulin aspart  4 Units Subcutaneous TID WC  . insulin glargine  15 Units Subcutaneous Daily  . nystatin  5 mL Oral QID  . predniSONE  60 mg Oral Q breakfast   PRN:gi cocktail, HYDROmorphone (DILAUDID) injection, menthol-cetylpyridinium, oxyCODONE, simethicone  Assessment/Plan: Subjective dysphagia complaint of unknown etiology.  No evidence of physical abnormality on previous exam and laryngoscopy. Normal swallowing study. Pt now refuses any further evaluation. No recommended intervention.  Will sign off.   LOS: 4 days   Chanteria Haggard,SUI W 11/08/2013, 10:03 AM

## 2013-12-02 ENCOUNTER — Inpatient Hospital Stay (HOSPITAL_COMMUNITY)
Admission: EM | Admit: 2013-12-02 | Discharge: 2014-01-03 | DRG: 870 | Disposition: E | Payer: Medicaid Other | Attending: Internal Medicine | Admitting: Internal Medicine

## 2013-12-02 ENCOUNTER — Encounter (HOSPITAL_COMMUNITY): Payer: Self-pay | Admitting: Emergency Medicine

## 2013-12-02 ENCOUNTER — Emergency Department (HOSPITAL_COMMUNITY): Payer: Medicaid Other

## 2013-12-02 DIAGNOSIS — J9383 Other pneumothorax: Secondary | ICD-10-CM | POA: Diagnosis not present

## 2013-12-02 DIAGNOSIS — K3184 Gastroparesis: Secondary | ICD-10-CM | POA: Diagnosis present

## 2013-12-02 DIAGNOSIS — M1990 Unspecified osteoarthritis, unspecified site: Secondary | ICD-10-CM

## 2013-12-02 DIAGNOSIS — R079 Chest pain, unspecified: Secondary | ICD-10-CM

## 2013-12-02 DIAGNOSIS — M359 Systemic involvement of connective tissue, unspecified: Secondary | ICD-10-CM | POA: Diagnosis present

## 2013-12-02 DIAGNOSIS — L4050 Arthropathic psoriasis, unspecified: Secondary | ICD-10-CM | POA: Diagnosis present

## 2013-12-02 DIAGNOSIS — R21 Rash and other nonspecific skin eruption: Secondary | ICD-10-CM

## 2013-12-02 DIAGNOSIS — I10 Essential (primary) hypertension: Secondary | ICD-10-CM | POA: Diagnosis present

## 2013-12-02 DIAGNOSIS — L409 Psoriasis, unspecified: Secondary | ICD-10-CM

## 2013-12-02 DIAGNOSIS — M061 Adult-onset Still's disease: Secondary | ICD-10-CM

## 2013-12-02 DIAGNOSIS — E781 Pure hyperglyceridemia: Secondary | ICD-10-CM | POA: Diagnosis present

## 2013-12-02 DIAGNOSIS — Z794 Long term (current) use of insulin: Secondary | ICD-10-CM

## 2013-12-02 DIAGNOSIS — D696 Thrombocytopenia, unspecified: Secondary | ICD-10-CM | POA: Diagnosis present

## 2013-12-02 DIAGNOSIS — Z515 Encounter for palliative care: Secondary | ICD-10-CM

## 2013-12-02 DIAGNOSIS — J852 Abscess of lung without pneumonia: Secondary | ICD-10-CM | POA: Diagnosis present

## 2013-12-02 DIAGNOSIS — J189 Pneumonia, unspecified organism: Secondary | ICD-10-CM

## 2013-12-02 DIAGNOSIS — N179 Acute kidney failure, unspecified: Secondary | ICD-10-CM | POA: Diagnosis present

## 2013-12-02 DIAGNOSIS — Z79899 Other long term (current) drug therapy: Secondary | ICD-10-CM

## 2013-12-02 DIAGNOSIS — E1149 Type 2 diabetes mellitus with other diabetic neurological complication: Secondary | ICD-10-CM | POA: Diagnosis present

## 2013-12-02 DIAGNOSIS — E8809 Other disorders of plasma-protein metabolism, not elsewhere classified: Secondary | ICD-10-CM | POA: Diagnosis present

## 2013-12-02 DIAGNOSIS — E43 Unspecified severe protein-calorie malnutrition: Secondary | ICD-10-CM | POA: Diagnosis present

## 2013-12-02 DIAGNOSIS — R6520 Severe sepsis without septic shock: Secondary | ICD-10-CM

## 2013-12-02 DIAGNOSIS — M069 Rheumatoid arthritis, unspecified: Secondary | ICD-10-CM | POA: Diagnosis present

## 2013-12-02 DIAGNOSIS — I498 Other specified cardiac arrhythmias: Secondary | ICD-10-CM | POA: Diagnosis not present

## 2013-12-02 DIAGNOSIS — E872 Acidosis, unspecified: Secondary | ICD-10-CM

## 2013-12-02 DIAGNOSIS — J984 Other disorders of lung: Secondary | ICD-10-CM

## 2013-12-02 DIAGNOSIS — A419 Sepsis, unspecified organism: Principal | ICD-10-CM | POA: Diagnosis present

## 2013-12-02 DIAGNOSIS — N170 Acute kidney failure with tubular necrosis: Secondary | ICD-10-CM

## 2013-12-02 DIAGNOSIS — R1310 Dysphagia, unspecified: Secondary | ICD-10-CM | POA: Diagnosis present

## 2013-12-02 DIAGNOSIS — Z87891 Personal history of nicotine dependence: Secondary | ICD-10-CM

## 2013-12-02 DIAGNOSIS — Z9911 Dependence on respirator [ventilator] status: Secondary | ICD-10-CM

## 2013-12-02 DIAGNOSIS — E860 Dehydration: Secondary | ICD-10-CM

## 2013-12-02 DIAGNOSIS — K920 Hematemesis: Secondary | ICD-10-CM | POA: Diagnosis not present

## 2013-12-02 DIAGNOSIS — E870 Hyperosmolality and hypernatremia: Secondary | ICD-10-CM | POA: Diagnosis present

## 2013-12-02 DIAGNOSIS — B961 Klebsiella pneumoniae [K. pneumoniae] as the cause of diseases classified elsewhere: Secondary | ICD-10-CM | POA: Diagnosis present

## 2013-12-02 DIAGNOSIS — Z66 Do not resuscitate: Secondary | ICD-10-CM | POA: Diagnosis present

## 2013-12-02 DIAGNOSIS — R627 Adult failure to thrive: Secondary | ICD-10-CM | POA: Diagnosis present

## 2013-12-02 DIAGNOSIS — R34 Anuria and oliguria: Secondary | ICD-10-CM | POA: Diagnosis not present

## 2013-12-02 DIAGNOSIS — R6521 Severe sepsis with septic shock: Secondary | ICD-10-CM

## 2013-12-02 DIAGNOSIS — E876 Hypokalemia: Secondary | ICD-10-CM | POA: Diagnosis not present

## 2013-12-02 DIAGNOSIS — J9600 Acute respiratory failure, unspecified whether with hypoxia or hypercapnia: Secondary | ICD-10-CM | POA: Diagnosis present

## 2013-12-02 DIAGNOSIS — K560 Paralytic ileus: Secondary | ICD-10-CM | POA: Diagnosis not present

## 2013-12-02 DIAGNOSIS — K2210 Ulcer of esophagus without bleeding: Secondary | ICD-10-CM | POA: Diagnosis not present

## 2013-12-02 DIAGNOSIS — IMO0002 Reserved for concepts with insufficient information to code with codable children: Secondary | ICD-10-CM

## 2013-12-02 DIAGNOSIS — D649 Anemia, unspecified: Secondary | ICD-10-CM | POA: Diagnosis present

## 2013-12-02 DIAGNOSIS — E874 Mixed disorder of acid-base balance: Secondary | ICD-10-CM | POA: Diagnosis present

## 2013-12-02 DIAGNOSIS — J9601 Acute respiratory failure with hypoxia: Secondary | ICD-10-CM

## 2013-12-02 DIAGNOSIS — R531 Weakness: Secondary | ICD-10-CM

## 2013-12-02 DIAGNOSIS — M0820 Juvenile rheumatoid arthritis with systemic onset, unspecified site: Secondary | ICD-10-CM

## 2013-12-02 LAB — CBC WITH DIFFERENTIAL/PLATELET
Basophils Absolute: 0.3 10*3/uL — ABNORMAL HIGH (ref 0.0–0.1)
Basophils Relative: 2 % — ABNORMAL HIGH (ref 0–1)
EOS ABS: 0 10*3/uL (ref 0.0–0.7)
Eosinophils Relative: 0 % (ref 0–5)
HCT: 36.6 % — ABNORMAL LOW (ref 39.0–52.0)
Hemoglobin: 12.4 g/dL — ABNORMAL LOW (ref 13.0–17.0)
LYMPHS ABS: 1 10*3/uL (ref 0.7–4.0)
Lymphocytes Relative: 7 % — ABNORMAL LOW (ref 12–46)
MCH: 28.6 pg (ref 26.0–34.0)
MCHC: 33.9 g/dL (ref 30.0–36.0)
MCV: 84.5 fL (ref 78.0–100.0)
MONOS PCT: 4 % (ref 3–12)
Monocytes Absolute: 0.6 10*3/uL (ref 0.1–1.0)
NEUTROS ABS: 12.2 10*3/uL — AB (ref 1.7–7.7)
Neutrophils Relative %: 87 % — ABNORMAL HIGH (ref 43–77)
PLATELETS: DECREASED 10*3/uL (ref 150–400)
RBC: 4.33 MIL/uL (ref 4.22–5.81)
RDW: 17.4 % — AB (ref 11.5–15.5)
WBC: 14.1 10*3/uL — ABNORMAL HIGH (ref 4.0–10.5)

## 2013-12-02 LAB — COMPREHENSIVE METABOLIC PANEL
ALT: 16 U/L (ref 0–53)
ANION GAP: 26 — AB (ref 5–15)
AST: 87 U/L — ABNORMAL HIGH (ref 0–37)
Albumin: 1.7 g/dL — ABNORMAL LOW (ref 3.5–5.2)
Alkaline Phosphatase: 93 U/L (ref 39–117)
BILIRUBIN TOTAL: 1.1 mg/dL (ref 0.3–1.2)
BUN: 74 mg/dL — AB (ref 6–23)
CHLORIDE: 95 meq/L — AB (ref 96–112)
CO2: 14 mEq/L — ABNORMAL LOW (ref 19–32)
CREATININE: 1.86 mg/dL — AB (ref 0.50–1.35)
Calcium: 7.5 mg/dL — ABNORMAL LOW (ref 8.4–10.5)
GFR calc Af Amer: 45 mL/min — ABNORMAL LOW (ref >90)
GFR calc non Af Amer: 39 mL/min — ABNORMAL LOW (ref >90)
Glucose, Bld: 192 mg/dL — ABNORMAL HIGH (ref 70–99)
Potassium: 4.7 mEq/L (ref 3.7–5.3)
Sodium: 135 mEq/L — ABNORMAL LOW (ref 137–147)
Total Protein: 7 g/dL (ref 6.0–8.3)

## 2013-12-02 LAB — I-STAT ARTERIAL BLOOD GAS, ED
ACID-BASE DEFICIT: 4 mmol/L — AB (ref 0.0–2.0)
BICARBONATE: 17.4 meq/L — AB (ref 20.0–24.0)
O2 Saturation: 99 %
PH ART: 7.466 — AB (ref 7.350–7.450)
Patient temperature: 101.4
TCO2: 18 mmol/L (ref 0–100)
pCO2 arterial: 24.4 mmHg — ABNORMAL LOW (ref 35.0–45.0)
pO2, Arterial: 130 mmHg — ABNORMAL HIGH (ref 80.0–100.0)

## 2013-12-02 LAB — I-STAT CG4 LACTIC ACID, ED: LACTIC ACID, VENOUS: 2.06 mmol/L (ref 0.5–2.2)

## 2013-12-02 MED ORDER — SODIUM CHLORIDE 0.9 % IV BOLUS (SEPSIS)
30.0000 mL/kg | Freq: Once | INTRAVENOUS | Status: AC
  Administered 2013-12-02: 1000 mL via INTRAVENOUS

## 2013-12-02 MED ORDER — SODIUM CHLORIDE 0.9 % IV BOLUS (SEPSIS)
30.0000 mL/kg | Freq: Once | INTRAVENOUS | Status: AC
  Administered 2013-12-02: 2136 mL via INTRAVENOUS

## 2013-12-02 MED ORDER — PIPERACILLIN-TAZOBACTAM 3.375 G IVPB 30 MIN
3.3750 g | Freq: Once | INTRAVENOUS | Status: AC
  Administered 2013-12-02: 3.375 g via INTRAVENOUS
  Filled 2013-12-02: qty 50

## 2013-12-02 MED ORDER — SODIUM CHLORIDE 0.9 % IV SOLN
1000.0000 mL | Freq: Once | INTRAVENOUS | Status: AC
  Administered 2013-12-02: 1000 mL via INTRAVENOUS

## 2013-12-02 MED ORDER — SODIUM CHLORIDE 0.9 % IV SOLN
1000.0000 mL | INTRAVENOUS | Status: DC
  Administered 2013-12-02: 1000 mL via INTRAVENOUS

## 2013-12-02 MED ORDER — SODIUM CHLORIDE 0.9 % IV SOLN: 1000.0000 mL | INTRAVENOUS | Status: DC

## 2013-12-02 MED ORDER — VANCOMYCIN HCL IN DEXTROSE 1-5 GM/200ML-% IV SOLN
1000.0000 mg | Freq: Once | INTRAVENOUS | Status: AC
  Administered 2013-12-02: 1000 mg via INTRAVENOUS
  Filled 2013-12-02: qty 200

## 2013-12-02 NOTE — ED Notes (Signed)
Per EMS: Pt from home for eval of sob and weakness, pt recently dx with dysphagia and failure to thrive. EMS noted clear lung sounds but reports labored breathing. Denies any cp, pt reports not being able to eat since being dx with dysphagia. EMS reports odorous and dark colored urine.

## 2013-12-02 NOTE — ED Provider Notes (Signed)
CSN: [id number]     Arrival date & time 11/09/2013  2129 History   First MD Initiated Contact with Patient 11/06/2013 2137     Chief Complaint  Patient presents with  . Shortness of Breath  . Dysphagia     (Consider location/radiation/quality/duration/timing/severity/associated sxs/prior Treatment) Patient is a 58 y.o. male presenting with altered mental status. The history is provided by the EMS personnel and medical records. The history is limited by the condition of the patient.  Altered Mental Status Presenting symptoms: partial responsiveness   Severity:  Severe Most recent episode:  More than 2 days ago Episode history:  Single Duration:  4 days Timing:  Constant Progression:  Worsening Chronicity:  Recurrent Context: recent change in medication, recent illness and recent infection   Context: not alcohol use, not dementia, not drug use, not head injury, not homeless, taking medications as prescribed and not a nursing home resident   Associated symptoms: difficulty breathing and weakness   Associated symptoms: no abdominal pain, normal movement, no agitation, no bladder incontinence, no decreased appetite, no depression, no eye deviation, no fever, no hallucinations, no headaches, no light-headedness, no nausea, no palpitations, no rash, no seizures, no slurred speech, no suicidal behavior, no visual change and no vomiting   Weakness:    Severity:  Unable to specify   Onset quality:  Unable to specify   Duration:  4 days   Timing:  Constant   Progression:  Worsening   Chronicity:  Recurrent  Patient presented with limited information available from EMS. Family member on scene relayed to EMS that patient had not been acting like himself for the past 4 days.  Patient was recently admitted for severe protein malnutrition and failure to thrive in an adult. Patient also has a history of recent diagnosis of psoriatic arthritis for which he's been taking prednisone and Humira.  Per EMS  patient was found partially responsive in bed with malodorous urine on bed sheets which appear dark. Patient received O2 by NRB mask in route with EMS due to hypoxia and shortness of breath.  Patient noted to be hypotensive but EMS was unable to obtain IV access prior to arrival.  Tachycardia noted.    Past Medical History  Diagnosis Date  . Allergic reaction   . Sinus disease   . Back pain   . Arthritis   . Edema   . Hypertension   . Diabetes mellitus without complication     insulin/metformin   Past Surgical History  Procedure Laterality Date  . No past surgeries     No family history on file. History  Substance Use Topics  . Smoking status: Former Smoker -- 0.25 packs/day for 20 years    Types: Cigarettes    Quit date: 09/13/2013  . Smokeless tobacco: Never Used  . Alcohol Use: No    Review of Systems  Unable to perform ROS: Mental status change  Constitutional: Negative for fever and decreased appetite.  HENT: Negative.   Eyes: Negative.   Respiratory: Positive for shortness of breath.   Cardiovascular: Negative.  Negative for palpitations.  Gastrointestinal: Negative.  Negative for nausea, vomiting and abdominal pain.  Endocrine: Negative.   Genitourinary: Negative.  Negative for bladder incontinence.  Musculoskeletal: Negative.   Skin: Negative.  Negative for rash.  Allergic/Immunologic: Negative.   Neurological: Positive for weakness. Negative for seizures, light-headedness and headaches.  Hematological: Negative.   Psychiatric/Behavioral: Negative for hallucinations and agitation.      Allergies  Review of patient's  allergies indicates no known allergies.  Home Medications   Prior to Admission medications   Medication Sig Start Date End Date Taking? Authorizing Provider  Adalimumab (HUMIRA) 40 MG/0.8ML PSKT Inject 40 mg into the skin once a week.    Historical Provider, MD  Alum & Mag Hydroxide-Simeth (GI COCKTAIL) SUSP suspension Take 30 mLs by mouth 3  (three) times daily as needed for indigestion. Shake well. 11/08/13   Costin Otelia Sergeant, MD  doxycycline (VIBRAMYCIN) 50 MG capsule Take 1 capsule (50 mg total) by mouth 2 (two) times daily. For 5 days 11/08/13   Leatha Gilding, MD  enalapril (VASOTEC) 2.5 MG tablet Take 1.25 mg by mouth daily.    Historical Provider, MD  insulin aspart (NOVOLOG) 100 UNIT/ML injection Inject 0-15 Units into the skin 3 (three) times daily with meals. 10/31/13   Jeanann Lewandowsky, MD  insulin glargine (LANTUS) 100 UNIT/ML injection Inject 0.15 mLs (15 Units total) into the skin daily. 11/08/13   Costin Otelia Sergeant, MD  metFORMIN (GLUCOPHAGE) 500 MG tablet Take 1 tablet (500 mg total) by mouth daily with breakfast. 10/31/13   Jeanann Lewandowsky, MD  nystatin (MYCOSTATIN) 100000 UNIT/ML suspension Take 5 mLs (500,000 Units total) by mouth 4 (four) times daily. 11/08/13   Costin Otelia Sergeant, MD  oxyCODONE 10 MG TABS Take 1 tablet (10 mg total) by mouth every 4 (four) hours as needed for severe pain. 11/08/13   Costin Otelia Sergeant, MD  predniSONE (DELTASONE) 20 MG tablet Take 20 mg by mouth 2 (two) times daily with a meal.    Historical Provider, MD  predniSONE (DELTASONE) 20 MG tablet Take 3 tablets (60 mg total) by mouth daily with breakfast. 3 tablets daily for 3 days then 2 tablets daily for 3 days then 1 tablet daily. 11/08/13   Costin Otelia Sergeant, MD  simethicone (MYLICON) 80 MG chewable tablet Chew 1 tablet (80 mg total) by mouth every 6 (six) hours as needed for flatulence. 11/08/13   Costin Otelia Sergeant, MD   BP 88/66  Pulse 127  Resp 30  Wt 157 lb (71.215 kg)  SpO2 80% Physical Exam  Constitutional: He appears well-developed. He appears lethargic. He appears cachectic. He appears distressed.  HENT:  Head: Normocephalic and atraumatic.  Right Ear: External ear normal.  Left Ear: External ear normal.  Nose: Nose normal.  Mouth/Throat: Oropharynx is clear and moist. Mucous membranes are pale and dry. No oropharyngeal exudate.  Eyes:  Conjunctivae and EOM are normal. Pupils are equal, round, and reactive to light. Right eye exhibits no discharge. Left eye exhibits no discharge. No scleral icterus.  Neck: Normal range of motion. Neck supple. No JVD present. No tracheal deviation present. No thyromegaly present.  Cardiovascular: Regular rhythm, normal heart sounds and intact distal pulses.  Tachycardia present.  Exam reveals no gallop and no friction rub.   No murmur heard. Pulmonary/Chest: Breath sounds normal. No stridor. Tachypnea noted. He is in respiratory distress. He has no wheezes. He has no rales. He exhibits no tenderness.  Abdominal: Soft. He exhibits no distension and no mass. There is no tenderness. There is no rebound and no guarding.  Musculoskeletal: Normal range of motion. He exhibits no edema and no tenderness.  Lymphadenopathy:    He has no cervical adenopathy.  Neurological: He appears lethargic. GCS eye subscore is 4. GCS verbal subscore is 4. GCS motor subscore is 6.  Eyes open, pt minimally responsive.  Speaks very softly.    Skin: Skin is warm.  No rash noted. He is diaphoretic. No erythema. No pallor.  Psychiatric:  Unable to assess.    ED Course  Procedures (including critical care time) Labs Review Labs Reviewed  CBC WITH DIFFERENTIAL - Abnormal; Notable for the following:    WBC 14.1 (*)    Hemoglobin 12.4 (*)    HCT 36.6 (*)    RDW 17.4 (*)    Neutrophils Relative % 87 (*)    Lymphocytes Relative 7 (*)    Basophils Relative 2 (*)    Neutro Abs 12.2 (*)    Basophils Absolute 0.3 (*)    All other components within normal limits  COMPREHENSIVE METABOLIC PANEL - Abnormal; Notable for the following:    Sodium 135 (*)    Chloride 95 (*)    CO2 14 (*)    Glucose, Bld 192 (*)    BUN 74 (*)    Creatinine, Ser 1.86 (*)    Calcium 7.5 (*)    Albumin 1.7 (*)    AST 87 (*)    GFR calc non Af Amer 39 (*)    GFR calc Af Amer 45 (*)    Anion gap 26 (*)    All other components within normal  limits  URINALYSIS, ROUTINE W REFLEX MICROSCOPIC - Abnormal; Notable for the following:    Color, Urine AMBER (*)    APPearance CLOUDY (*)    Hgb urine dipstick LARGE (*)    Bilirubin Urine SMALL (*)    Ketones, ur 15 (*)    Protein, ur 30 (*)    All other components within normal limits  URINE MICROSCOPIC-ADD ON - Abnormal; Notable for the following:    Casts HYALINE CASTS (*)    All other components within normal limits  BASIC METABOLIC PANEL - Abnormal; Notable for the following:    Potassium 3.5 (*)    CO2 17 (*)    Glucose, Bld 165 (*)    BUN 62 (*)    Creatinine, Ser 1.52 (*)    Calcium 6.7 (*)    GFR calc non Af Amer 50 (*)    GFR calc Af Amer 57 (*)    Anion gap 17 (*)    All other components within normal limits  BLOOD GAS, ARTERIAL - Abnormal; Notable for the following:    pH, Arterial 7.346 (*)    pCO2 arterial 33.5 (*)    pO2, Arterial 290.0 (*)    Bicarbonate 17.8 (*)    Acid-base deficit 6.8 (*)    All other components within normal limits  I-STAT CG4 LACTIC ACID, ED    Imaging Review Dg Chest Port 1 View  12/03/2013   CLINICAL DATA:  Central line placement.  EXAM: PORTABLE CHEST - 1 VIEW  COMPARISON:  Earlier today.  FINDINGS: The endotracheal tube remains in satisfactory position. Interval left jugular catheter with its tip in the superior vena cava. No pneumothorax. Normal-sized heart. Stable bilateral airspace opacity and prominent interstitial markings. No pleural fluid. Nasogastric tube extending into the stomach with the side hole in the distal esophagus.  IMPRESSION: 1. Left jugular catheter tip in the superior vena cava without pneumothorax. 2. Stable bilateral pulmonary edema or pneumonitis.   Electronically Signed   By: Gordan Payment M.D.   On: 12/03/2013 13:08   Dg Chest Port 1 View  12/03/2013   CLINICAL DATA:  Endotracheal tube placement.  EXAM: PORTABLE CHEST - 1 VIEW  COMPARISON:  04-Dec-2013  FINDINGS: Interval placement of an endotracheal tube with tip  measuring 5.9 cm above the carinal. An enteric tube was placed. The tip is off the field of view but below the left hemidiaphragm. Heart size is normal. Increasing bilateral pulmonary infiltrates. No pleural effusion. No pneumothorax.  IMPRESSION: Endotracheal tube tip measures 5.9 cm above the carina. Increasing bilateral pulmonary infiltrates.   Electronically Signed   By: Burman Nieves M.D.   On: 12/03/2013 05:22   Dg Chest Port 1 View  11/09/2013   CLINICAL DATA:  Could sepsis.  Shortness of breath and dysphagia.  EXAM: PORTABLE CHEST - 1 VIEW  COMPARISON:  11/04/2013  FINDINGS: Normal heart size and pulmonary vascularity. There is increased interstitial infiltration throughout both lungs which appears increased since previous study. This may indicate interstitial pneumonitis or interstitial edema. No pneumothorax. No blunting of costophrenic angles.  IMPRESSION: Increased interstitial changes throughout the lungs suggesting interstitial pneumonitis versus edema.   Electronically Signed   By: Burman Nieves M.D.   On: 11/27/2013 22:33   Dg Abd Portable 1v  12/03/2013   CLINICAL DATA:  OG tube insertion.  EXAM: PORTABLE ABDOMEN - 1 VIEW  COMPARISON:  None.  FINDINGS: Enteric tube tip is in the left upper quadrant consistent with location in the upper stomach. Proximal side hole appears to be just below the GE junction. Catheter projected over the right pelvis probably represents a right femoral central venous catheter. Bowel gas pattern is unremarkable with scattered gas in the colon. No small or large bowel distention.  IMPRESSION: Enteric tube tip is projected over the upper stomach.   Electronically Signed   By: Burman Nieves M.D.   On: 12/03/2013 05:24     EKG Interpretation   Date/Time:  Friday December 02 2013 21:39:49 EDT Ventricular Rate:  125 PR Interval:  115 QRS Duration: 59 QT Interval:  324 QTC Calculation: 467 R Axis:   55 Text Interpretation:  Sinus tachycardia Artifact in  lead(s) I Confirmed by  RAY MD, Duwayne Heck ([id number]) on 11/09/2013 11:52:10 PM      MDM   Final diagnoses:  Septic shock  HCAP (healthcare-associated pneumonia)  AKI (acute kidney injury)   Upon arrival patient had tachycardic and hypotensive. Patient appears septic made code sepsis.  General workup and imaging studies initiated for infectious sources. Likely urinary based on history. Patient appears dry. Central line placed due to poor vascular access and likely need for pressors and significant fluid resuscitation. Patient blood pressure and tachycardia responded to 2 L IV bolus of normal saline. Rectal temp noted to be 101.4. Patient started on broad-spectrum coverage with vancomycin and Zosyn.  Laboratory workup significant for multiple lateral abnormalities and patient has a mixed acid-base disorder.  Chest x-ray did not show a clear pneumonia, but possible pulmonary source for his symptoms. Patient has creatinine elevation consistent with his severe dehydration. Patient has been discussed with critical care and she will need ICU level care due to possible for the decompensation however has initially responded to IV hydration. Will need further broad-spectrum coverage for his infection. Due to his mixed acid-base disorder we'll obtain a salicylate level. Patient was transferred to the ICU in critical but hemodynamically stable condition.  Patient care was discussed with my attending, Dr. Rosalia Hammers.     Gavin Pound, MD 12/03/13 [phone]

## 2013-12-02 NOTE — ED Notes (Signed)
During IV

## 2013-12-02 NOTE — ED Notes (Signed)
RN and phlebotomy at bedside to attempt blood draw. Unsuccessful attempt by both. Resident and Dr. Rosalia Hammers at bedside to do femoral stick and attempt access.

## 2013-12-02 NOTE — ED Notes (Signed)
Leakage of fluid noted around femoral Central line, infusions stopped and Dr. Rosalia Hammers made aware. Dr. Rosalia Hammers at bedside, new central line will be placed, catheter was intact and blood return noted.

## 2013-12-03 ENCOUNTER — Inpatient Hospital Stay (HOSPITAL_COMMUNITY): Payer: Medicaid Other

## 2013-12-03 DIAGNOSIS — R5381 Other malaise: Secondary | ICD-10-CM | POA: Diagnosis not present

## 2013-12-03 DIAGNOSIS — R1310 Dysphagia, unspecified: Secondary | ICD-10-CM | POA: Diagnosis present

## 2013-12-03 DIAGNOSIS — K920 Hematemesis: Secondary | ICD-10-CM | POA: Diagnosis not present

## 2013-12-03 DIAGNOSIS — I10 Essential (primary) hypertension: Secondary | ICD-10-CM | POA: Diagnosis present

## 2013-12-03 DIAGNOSIS — J852 Abscess of lung without pneumonia: Secondary | ICD-10-CM | POA: Diagnosis present

## 2013-12-03 DIAGNOSIS — R6521 Severe sepsis with septic shock: Secondary | ICD-10-CM

## 2013-12-03 DIAGNOSIS — M069 Rheumatoid arthritis, unspecified: Secondary | ICD-10-CM | POA: Diagnosis present

## 2013-12-03 DIAGNOSIS — L4050 Arthropathic psoriasis, unspecified: Secondary | ICD-10-CM | POA: Diagnosis not present

## 2013-12-03 DIAGNOSIS — A419 Sepsis, unspecified organism: Secondary | ICD-10-CM | POA: Diagnosis present

## 2013-12-03 DIAGNOSIS — E781 Pure hyperglyceridemia: Secondary | ICD-10-CM | POA: Diagnosis present

## 2013-12-03 DIAGNOSIS — D696 Thrombocytopenia, unspecified: Secondary | ICD-10-CM | POA: Diagnosis present

## 2013-12-03 DIAGNOSIS — R918 Other nonspecific abnormal finding of lung field: Secondary | ICD-10-CM | POA: Diagnosis not present

## 2013-12-03 DIAGNOSIS — J984 Other disorders of lung: Secondary | ICD-10-CM | POA: Diagnosis not present

## 2013-12-03 DIAGNOSIS — Z87891 Personal history of nicotine dependence: Secondary | ICD-10-CM | POA: Diagnosis not present

## 2013-12-03 DIAGNOSIS — E1149 Type 2 diabetes mellitus with other diabetic neurological complication: Secondary | ICD-10-CM | POA: Diagnosis present

## 2013-12-03 DIAGNOSIS — E876 Hypokalemia: Secondary | ICD-10-CM | POA: Diagnosis not present

## 2013-12-03 DIAGNOSIS — K2210 Ulcer of esophagus without bleeding: Secondary | ICD-10-CM | POA: Diagnosis not present

## 2013-12-03 DIAGNOSIS — Z79899 Other long term (current) drug therapy: Secondary | ICD-10-CM | POA: Diagnosis not present

## 2013-12-03 DIAGNOSIS — R093 Abnormal sputum: Secondary | ICD-10-CM | POA: Diagnosis not present

## 2013-12-03 DIAGNOSIS — IMO0002 Reserved for concepts with insufficient information to code with codable children: Secondary | ICD-10-CM | POA: Diagnosis not present

## 2013-12-03 DIAGNOSIS — Z515 Encounter for palliative care: Secondary | ICD-10-CM | POA: Diagnosis not present

## 2013-12-03 DIAGNOSIS — Z66 Do not resuscitate: Secondary | ICD-10-CM | POA: Diagnosis present

## 2013-12-03 DIAGNOSIS — E870 Hyperosmolality and hypernatremia: Secondary | ICD-10-CM | POA: Diagnosis present

## 2013-12-03 DIAGNOSIS — A4159 Other Gram-negative sepsis: Secondary | ICD-10-CM | POA: Diagnosis not present

## 2013-12-03 DIAGNOSIS — K560 Paralytic ileus: Secondary | ICD-10-CM | POA: Diagnosis not present

## 2013-12-03 DIAGNOSIS — N170 Acute kidney failure with tubular necrosis: Secondary | ICD-10-CM | POA: Diagnosis not present

## 2013-12-03 DIAGNOSIS — D649 Anemia, unspecified: Secondary | ICD-10-CM | POA: Diagnosis present

## 2013-12-03 DIAGNOSIS — I498 Other specified cardiac arrhythmias: Secondary | ICD-10-CM | POA: Diagnosis not present

## 2013-12-03 DIAGNOSIS — J9383 Other pneumothorax: Secondary | ICD-10-CM | POA: Diagnosis not present

## 2013-12-03 DIAGNOSIS — J189 Pneumonia, unspecified organism: Secondary | ICD-10-CM | POA: Diagnosis not present

## 2013-12-03 DIAGNOSIS — R34 Anuria and oliguria: Secondary | ICD-10-CM | POA: Diagnosis not present

## 2013-12-03 DIAGNOSIS — K3184 Gastroparesis: Secondary | ICD-10-CM | POA: Diagnosis present

## 2013-12-03 DIAGNOSIS — Z794 Long term (current) use of insulin: Secondary | ICD-10-CM | POA: Diagnosis not present

## 2013-12-03 DIAGNOSIS — E860 Dehydration: Secondary | ICD-10-CM | POA: Diagnosis not present

## 2013-12-03 DIAGNOSIS — M359 Systemic involvement of connective tissue, unspecified: Secondary | ICD-10-CM | POA: Diagnosis present

## 2013-12-03 DIAGNOSIS — M129 Arthropathy, unspecified: Secondary | ICD-10-CM | POA: Diagnosis not present

## 2013-12-03 DIAGNOSIS — N289 Disorder of kidney and ureter, unspecified: Secondary | ICD-10-CM | POA: Diagnosis not present

## 2013-12-03 DIAGNOSIS — R627 Adult failure to thrive: Secondary | ICD-10-CM | POA: Diagnosis present

## 2013-12-03 DIAGNOSIS — J9600 Acute respiratory failure, unspecified whether with hypoxia or hypercapnia: Secondary | ICD-10-CM | POA: Diagnosis present

## 2013-12-03 DIAGNOSIS — E874 Mixed disorder of acid-base balance: Secondary | ICD-10-CM | POA: Diagnosis present

## 2013-12-03 DIAGNOSIS — R4182 Altered mental status, unspecified: Secondary | ICD-10-CM | POA: Diagnosis present

## 2013-12-03 DIAGNOSIS — E43 Unspecified severe protein-calorie malnutrition: Secondary | ICD-10-CM | POA: Diagnosis present

## 2013-12-03 DIAGNOSIS — N179 Acute kidney failure, unspecified: Secondary | ICD-10-CM | POA: Diagnosis present

## 2013-12-03 DIAGNOSIS — E8809 Other disorders of plasma-protein metabolism, not elsewhere classified: Secondary | ICD-10-CM | POA: Diagnosis present

## 2013-12-03 DIAGNOSIS — R6520 Severe sepsis without septic shock: Secondary | ICD-10-CM

## 2013-12-03 DIAGNOSIS — B961 Klebsiella pneumoniae [K. pneumoniae] as the cause of diseases classified elsewhere: Secondary | ICD-10-CM | POA: Diagnosis present

## 2013-12-03 DIAGNOSIS — B3789 Other sites of candidiasis: Secondary | ICD-10-CM | POA: Diagnosis not present

## 2013-12-03 LAB — BLOOD GAS, ARTERIAL
Acid-base deficit: 6.8 mmol/L — ABNORMAL HIGH (ref 0.0–2.0)
BICARBONATE: 17.8 meq/L — AB (ref 20.0–24.0)
DRAWN BY: 369891
FIO2: 100 %
LHR: 24 {breaths}/min
MECHVT: 600 mL
O2 SAT: 99.4 %
PEEP: 5 cmH2O
PO2 ART: 290 mmHg — AB (ref 80.0–100.0)
Patient temperature: 98.6
TCO2: 18.9 mmol/L (ref 0–100)
pCO2 arterial: 33.5 mmHg — ABNORMAL LOW (ref 35.0–45.0)
pH, Arterial: 7.346 — ABNORMAL LOW (ref 7.350–7.450)

## 2013-12-03 LAB — T4, FREE: Free T4: 0.6 ng/dL — ABNORMAL LOW (ref 0.80–1.80)

## 2013-12-03 LAB — CBC
HEMATOCRIT: 28.1 % — AB (ref 39.0–52.0)
Hemoglobin: 9.4 g/dL — ABNORMAL LOW (ref 13.0–17.0)
MCH: 28.5 pg (ref 26.0–34.0)
MCHC: 33.5 g/dL (ref 30.0–36.0)
MCV: 85.2 fL (ref 78.0–100.0)
Platelets: 88 10*3/uL — ABNORMAL LOW (ref 150–400)
RBC: 3.3 MIL/uL — AB (ref 4.22–5.81)
RDW: 17.9 % — ABNORMAL HIGH (ref 11.5–15.5)
WBC: 9.1 10*3/uL (ref 4.0–10.5)

## 2013-12-03 LAB — GLUCOSE, CAPILLARY
GLUCOSE-CAPILLARY: 183 mg/dL — AB (ref 70–99)
GLUCOSE-CAPILLARY: 163 mg/dL — AB (ref 70–99)
GLUCOSE-CAPILLARY: 197 mg/dL — AB (ref 70–99)
GLUCOSE-CAPILLARY: 235 mg/dL — AB (ref 70–99)
Glucose-Capillary: 247 mg/dL — ABNORMAL HIGH (ref 70–99)

## 2013-12-03 LAB — URINE MICROSCOPIC-ADD ON

## 2013-12-03 LAB — BASIC METABOLIC PANEL
Anion gap: 17 — ABNORMAL HIGH (ref 5–15)
BUN: 62 mg/dL — ABNORMAL HIGH (ref 6–23)
CHLORIDE: 105 meq/L (ref 96–112)
CO2: 17 meq/L — AB (ref 19–32)
Calcium: 6.7 mg/dL — ABNORMAL LOW (ref 8.4–10.5)
Creatinine, Ser: 1.52 mg/dL — ABNORMAL HIGH (ref 0.50–1.35)
GFR calc non Af Amer: 50 mL/min — ABNORMAL LOW (ref >90)
GFR, EST AFRICAN AMERICAN: 57 mL/min — AB (ref >90)
GLUCOSE: 165 mg/dL — AB (ref 70–99)
POTASSIUM: 3.5 meq/L — AB (ref 3.7–5.3)
Sodium: 139 mEq/L (ref 137–147)

## 2013-12-03 LAB — SALICYLATE LEVEL: Salicylate Lvl: <2 mg/dL — ABNORMAL LOW (ref 2.8–20.0)

## 2013-12-03 LAB — URINALYSIS, ROUTINE W REFLEX MICROSCOPIC
Glucose, UA: NEGATIVE mg/dL
KETONES UR: 15 mg/dL — AB
LEUKOCYTES UA: NEGATIVE
NITRITE: NEGATIVE
Protein, ur: 30 mg/dL — AB
SPECIFIC GRAVITY, URINE: 1.016 (ref 1.005–1.030)
UROBILINOGEN UA: 1 mg/dL (ref 0.0–1.0)
pH: 5 (ref 5.0–8.0)

## 2013-12-03 LAB — PROCALCITONIN: PROCALCITONIN: 1.64 ng/mL

## 2013-12-03 LAB — PROTIME-INR
INR: 1.2 (ref 0.00–1.49)
Prothrombin Time: 15.2 seconds (ref 11.6–15.2)

## 2013-12-03 LAB — PHOSPHORUS: Phosphorus: 4.6 mg/dL (ref 2.3–4.6)

## 2013-12-03 LAB — TSH: TSH: 1.38 u[IU]/mL (ref 0.350–4.500)

## 2013-12-03 LAB — RAPID STREP SCREEN (MED CTR MEBANE ONLY): STREPTOCOCCUS, GROUP A SCREEN (DIRECT): NEGATIVE

## 2013-12-03 LAB — MAGNESIUM: MAGNESIUM: 2.5 mg/dL (ref 1.5–2.5)

## 2013-12-03 LAB — MRSA PCR SCREENING: MRSA by PCR: NEGATIVE

## 2013-12-03 MED ORDER — SODIUM CHLORIDE 0.9 % IV SOLN
250.0000 mL | INTRAVENOUS | Status: DC | PRN
  Administered 2013-12-04: 1000 mL via INTRAVENOUS
  Administered 2013-12-12: 250 mL via INTRAVENOUS

## 2013-12-03 MED ORDER — VANCOMYCIN HCL IN DEXTROSE 750-5 MG/150ML-% IV SOLN
750.0000 mg | Freq: Two times a day (BID) | INTRAVENOUS | Status: DC
  Administered 2013-12-03 – 2013-12-04 (×3): 750 mg via INTRAVENOUS
  Filled 2013-12-03 (×5): qty 150

## 2013-12-03 MED ORDER — PROPOFOL 10 MG/ML IV EMUL
INTRAVENOUS | Status: AC
  Filled 2013-12-03: qty 100

## 2013-12-03 MED ORDER — PIPERACILLIN-TAZOBACTAM 3.375 G IVPB
3.3750 g | Freq: Three times a day (TID) | INTRAVENOUS | Status: DC
Start: 2013-12-03 — End: 2013-12-10
  Administered 2013-12-03 – 2013-12-10 (×22): 3.375 g via INTRAVENOUS
  Filled 2013-12-03 (×24): qty 50

## 2013-12-03 MED ORDER — SODIUM CHLORIDE 0.9 % IV SOLN
10.0000 ug/h | INTRAVENOUS | Status: DC
  Administered 2013-12-03: 50 ug/h via INTRAVENOUS
  Administered 2013-12-04 (×2): 150 ug/h via INTRAVENOUS
  Administered 2013-12-05: 200 ug/h via INTRAVENOUS
  Administered 2013-12-06: 300 ug/h via INTRAVENOUS
  Administered 2013-12-06: 100 ug/h via INTRAVENOUS
  Administered 2013-12-07 (×2): 300 ug/h via INTRAVENOUS
  Administered 2013-12-07: 150 ug/h via INTRAVENOUS
  Administered 2013-12-07: 250 ug/h via INTRAVENOUS
  Administered 2013-12-08 (×2): 300 ug/h via INTRAVENOUS
  Administered 2013-12-09: 50 ug/h via INTRAVENOUS
  Administered 2013-12-09: 300 ug/h via INTRAVENOUS
  Administered 2013-12-09: 400 ug/h via INTRAVENOUS
  Administered 2013-12-09 – 2013-12-10 (×2): 300 ug/h via INTRAVENOUS
  Administered 2013-12-10: 200 ug/h via INTRAVENOUS
  Administered 2013-12-11: 100 ug/h via INTRAVENOUS
  Administered 2013-12-12: 350 ug/h via INTRAVENOUS
  Administered 2013-12-14 – 2013-12-15 (×3): 100 ug/h via INTRAVENOUS
  Administered 2013-12-16: 10 ug/h via INTRAVENOUS
  Filled 2013-12-03 (×25): qty 50

## 2013-12-03 MED ORDER — IPRATROPIUM-ALBUTEROL 0.5-2.5 (3) MG/3ML IN SOLN
3.0000 mL | RESPIRATORY_TRACT | Status: DC
  Administered 2013-12-03 – 2013-12-10 (×44): 3 mL via RESPIRATORY_TRACT
  Filled 2013-12-03 (×42): qty 3

## 2013-12-03 MED ORDER — SODIUM CHLORIDE 0.9 % IV SOLN: INTRAVENOUS | Status: AC

## 2013-12-03 MED ORDER — OXEPA PO LIQD
1000.0000 mL | ORAL | Status: DC
  Administered 2013-12-04 – 2013-12-05 (×2): 1000 mL
  Filled 2013-12-03 (×3): qty 1000

## 2013-12-03 MED ORDER — CETYLPYRIDINIUM CHLORIDE 0.05 % MT LIQD: 7.0000 mL | Freq: Four times a day (QID) | OROMUCOSAL | Status: DC

## 2013-12-03 MED ORDER — HYDROCORTISONE NA SUCCINATE PF 100 MG IJ SOLR
100.0000 mg | Freq: Three times a day (TID) | INTRAMUSCULAR | Status: DC
  Administered 2013-12-03 – 2013-12-06 (×11): 100 mg via INTRAVENOUS
  Filled 2013-12-03 (×15): qty 2

## 2013-12-03 MED ORDER — FENTANYL BOLUS VIA INFUSION
50.0000 ug | INTRAVENOUS | Status: DC | PRN
  Administered 2013-12-07 – 2013-12-10 (×8): 50 ug via INTRAVENOUS
  Filled 2013-12-03: qty 50

## 2013-12-03 MED ORDER — INSULIN ASPART 100 UNIT/ML ~~LOC~~ SOLN
2.0000 [IU] | SUBCUTANEOUS | Status: DC
  Administered 2013-12-03 (×2): 4 [IU] via SUBCUTANEOUS
  Administered 2013-12-03 (×2): 6 [IU] via SUBCUTANEOUS
  Administered 2013-12-03: 4 [IU] via SUBCUTANEOUS
  Administered 2013-12-04: 2 [IU] via SUBCUTANEOUS
  Administered 2013-12-04 – 2013-12-05 (×3): 4 [IU] via SUBCUTANEOUS
  Administered 2013-12-05: 2 [IU] via SUBCUTANEOUS
  Administered 2013-12-05: 4 [IU] via SUBCUTANEOUS
  Administered 2013-12-05 – 2013-12-06 (×2): 6 [IU] via SUBCUTANEOUS
  Administered 2013-12-06: 4 [IU] via SUBCUTANEOUS
  Administered 2013-12-06: 2 [IU] via SUBCUTANEOUS
  Administered 2013-12-06: 6 [IU] via SUBCUTANEOUS
  Administered 2013-12-07: 2 [IU] via SUBCUTANEOUS
  Administered 2013-12-07: 4 [IU] via SUBCUTANEOUS
  Administered 2013-12-07: 2 [IU] via SUBCUTANEOUS
  Administered 2013-12-07 (×2): 6 [IU] via SUBCUTANEOUS

## 2013-12-03 MED ORDER — PANTOPRAZOLE SODIUM 40 MG IV SOLR
40.0000 mg | Freq: Every day | INTRAVENOUS | Status: DC
  Administered 2013-12-03 (×2): 40 mg via INTRAVENOUS
  Filled 2013-12-03 (×4): qty 40

## 2013-12-03 MED ORDER — SODIUM CHLORIDE 0.9 % IV BOLUS (SEPSIS)
500.0000 mL | Freq: Once | INTRAVENOUS | Status: AC
  Administered 2013-12-03: 500 mL via INTRAVENOUS

## 2013-12-03 MED ORDER — OXEPA PO LIQD
1000.0000 mL | ORAL | Status: DC
  Filled 2013-12-03 (×2): qty 1000

## 2013-12-03 MED ORDER — ALBUMIN HUMAN 5 % IV SOLN
25.0000 g | Freq: Once | INTRAVENOUS | Status: AC
  Administered 2013-12-03: 25 g via INTRAVENOUS
  Filled 2013-12-03: qty 500

## 2013-12-03 MED ORDER — FENTANYL CITRATE 0.05 MG/ML IJ SOLN
100.0000 ug | Freq: Once | INTRAMUSCULAR | Status: AC
  Administered 2013-12-03: 100 ug via INTRAVENOUS

## 2013-12-03 MED ORDER — PRO-STAT SUGAR FREE PO LIQD
30.0000 mL | Freq: Two times a day (BID) | ORAL | Status: DC
  Administered 2013-12-04 – 2013-12-05 (×4): 30 mL via ORAL
  Filled 2013-12-03 (×12): qty 30

## 2013-12-03 MED ORDER — NOREPINEPHRINE BITARTRATE 1 MG/ML IV SOLN
2.0000 ug/min | INTRAVENOUS | Status: DC
  Administered 2013-12-03: 10 ug/min via INTRAVENOUS
  Administered 2013-12-04: 12 ug/min via INTRAVENOUS
  Filled 2013-12-03 (×3): qty 16

## 2013-12-03 MED ORDER — CHLORHEXIDINE GLUCONATE 0.12 % MT SOLN: 15.0000 mL | Freq: Two times a day (BID) | OROMUCOSAL | Status: DC

## 2013-12-03 MED ORDER — PROPOFOL 10 MG/ML IV EMUL
5.0000 ug/kg/min | INTRAVENOUS | Status: DC
  Administered 2013-12-03: 14.981 ug/kg/min via INTRAVENOUS
  Administered 2013-12-03: 20 ug/kg/min via INTRAVENOUS
  Administered 2013-12-03: 19.897 ug/kg/min via INTRAVENOUS
  Administered 2013-12-03: 50 ug/kg/min via INTRAVENOUS
  Administered 2013-12-04 (×3): 30 ug/kg/min via INTRAVENOUS
  Administered 2013-12-05: 25.047 ug/kg/min via INTRAVENOUS
  Filled 2013-12-03 (×7): qty 100

## 2013-12-03 MED ORDER — SODIUM CHLORIDE 0.9 % IV SOLN
100.0000 mg | Freq: Every day | INTRAVENOUS | Status: DC
  Administered 2013-12-03 – 2013-12-10 (×8): 100 mg via INTRAVENOUS
  Filled 2013-12-03 (×8): qty 100

## 2013-12-03 NOTE — Progress Notes (Signed)
ANTIBIOTIC CONSULT NOTE - INITIAL  Pharmacy Consult for Vancocin and Zosyn Indication: rule out pneumonia  No Known Allergies  Patient Measurements: Height: 5' 11.65" (182 cm) Weight: 157 lb (71.215 kg) IBW/kg (Calculated) : 76.8  Vital Signs: Temp: 101.4 F (38.6 C) (07/31 2134) Temp src: Rectal (07/31 2134) BP: 85/65 mmHg (08/01 0015) Pulse Rate: 104 (08/01 0015)  Labs:  Recent Labs  11/05/2013 2157  WBC 14.1*  HGB 12.4*  PLT PLATELET CLUMPS NOTED ON SMEAR, COUNT APPEARS DECREASED  CREATININE 1.86*   Estimated Creatinine Clearance: 44.7 ml/min (by C-G formula based on Cr of 1.86).   Medical History: Past Medical History  Diagnosis Date  . Allergic reaction   . Sinus disease   . Back pain   . Arthritis   . Edema   . Hypertension   . Diabetes mellitus without complication     insulin/metformin     Assessment: 58yo male c/o SOB and weakness w/ recent dx of dysphagia and failure to thrive, EMS reports dark odorous urine, CXR concerning for pneumonitis, to begin IV ABX; noted baseline SCr ~0.7, now 1.86.  Goal of Therapy:  Vancomycin trough level 15-20 mcg/ml  Plan:  Rec'd vanc 1g and Zosyn 3.375g IV in ED; will continue with vancomycin 750mg  IV Q12H and Zosyn 3.375g IV Q8H and monitor CBC, Cx, SCr, levels prn.  , PharmD, BCPS  12/03/2013,1:17 AM

## 2013-12-03 NOTE — H&P (Signed)
PULMONARY / CRITICAL CARE MEDICINE   Name: Lawrence Jennings MRN: [id number] DOB: Mar 14, 1956    ADMISSION DATE:  11/03/2013 CONSULTATION DATE:  12/03/13   REFERRING MD :  ED  CHIEF COMPLAINT:  AMS  INITIAL PRESENTATION: Found by his sister with altered mental status and weakness, brought to [facility] ED via EMS  STUDIES:  CXR - Interstitial infiltrates  SIGNIFICANT EVENTS: 12/03/13 - admitted to ICU   HISTORY OF PRESENT ILLNESS:  Mr. Lawrence Jennings is a 58 y/o man with a history of failure to thrive recently admitted for dysphagia.  He has psoriatic arthritis and is on Humira and prednisone.  He was reportedly found today by his sister with weakness, shortness of breath, altered mental status, dark, foul smelling urine.  He was brought to [facility] ED via EMS where he was noted to be tachypneic, tachycardic, hypotensive and febrile.  He was given a fluid bolus, however this was stopped when his chest x-ray showed concerns for pulmonary edema.  Mr. Lawrence Jennings denies any nausea, vomiting or diarrhea.  He denies having fevers at home.  He was admitted from 7/3-7/7 for failure to thrive.  He was treated with doxycycline for possible cellulitis.  He was discharged home on a prednisone taper for his arthritis and is currently taking 20mg  daily.  He has a known history of mild thrombocytopenia of unknown etiology.  He also has a history of diabetes that has been poorly controlled.   PAST MEDICAL HISTORY :  Past Medical History  Diagnosis Date  . Allergic reaction   . Sinus disease   . Back pain   . Arthritis   . Edema   . Hypertension   . Diabetes mellitus without complication     insulin/metformin   Past Surgical History  Procedure Laterality Date  . No past surgeries     Prior to Admission medications   Medication Sig Start Date End Date Taking? Authorizing Provider  Adalimumab (HUMIRA) 40 MG/0.8ML PSKT Inject 40 mg into the skin once a week.    Historical Provider, MD  Alum & Mag Hydroxide-Simeth (GI  COCKTAIL) SUSP suspension Take 30 mLs by mouth 3 (three) times daily as needed for indigestion. Shake well. 11/08/13   Costin Otelia Sergeant, MD  doxycycline (VIBRAMYCIN) 50 MG capsule Take 1 capsule (50 mg total) by mouth 2 (two) times daily. For 5 days 11/08/13   Leatha Gilding, MD  enalapril (VASOTEC) 2.5 MG tablet Take 1.25 mg by mouth daily.    Historical Provider, MD  insulin aspart (NOVOLOG) 100 UNIT/ML injection Inject 0-15 Units into the skin 3 (three) times daily with meals. 10/31/13   Jeanann Lewandowsky, MD  insulin glargine (LANTUS) 100 UNIT/ML injection Inject 0.15 mLs (15 Units total) into the skin daily. 11/08/13   Costin Otelia Sergeant, MD  metFORMIN (GLUCOPHAGE) 500 MG tablet Take 1 tablet (500 mg total) by mouth daily with breakfast. 10/31/13   Jeanann Lewandowsky, MD  nystatin (MYCOSTATIN) 100000 UNIT/ML suspension Take 5 mLs (500,000 Units total) by mouth 4 (four) times daily. 11/08/13   Costin Otelia Sergeant, MD  oxyCODONE 10 MG TABS Take 1 tablet (10 mg total) by mouth every 4 (four) hours as needed for severe pain. 11/08/13   Costin Otelia Sergeant, MD  predniSONE (DELTASONE) 20 MG tablet Take 20 mg by mouth 2 (two) times daily with a meal.    Historical Provider, MD  predniSONE (DELTASONE) 20 MG tablet Take 3 tablets (60 mg total) by mouth daily with breakfast. 3 tablets daily for  3 days then 2 tablets daily for 3 days then 1 tablet daily. 11/08/13   Costin Otelia Sergeant, MD  simethicone (MYLICON) 80 MG chewable tablet Chew 1 tablet (80 mg total) by mouth every 6 (six) hours as needed for flatulence. 11/08/13   Costin Otelia Sergeant, MD   No Known Allergies  FAMILY HISTORY:  No family history on file. SOCIAL HISTORY:  reports that he quit smoking about 2 months ago. His smoking use included Cigarettes. He has a 5 pack-year smoking history. He has never used smokeless tobacco. He reports that he does not drink alcohol or use illicit drugs.  REVIEW OF SYSTEMS:  A review of 14 systems was negative except as stated in the  HPI.     VITAL SIGNS: Temp:  [101.4 F (38.6 C)] 101.4 F (38.6 C) (07/31 2134) Pulse Rate:  [25-127] 107 (08/01 0130) Resp:  [16-42] 40 (08/01 0130) BP: (74-126)/(58-101) 105/80 mmHg (08/01 0130) SpO2:  [80 %-100 %] 100 % (08/01 0130) Weight:  [71.215 kg (157 lb)] 71.215 kg (157 lb) (07/31 2134) HEMODYNAMICS:   VENTILATOR SETTINGS:   INTAKE / OUTPUT:  Intake/Output Summary (Last 24 hours) at 12/03/13 0146 Last data filed at 11/23/2013 2248  Gross per 24 hour  Intake   1000 ml  Output      0 ml  Net   1000 ml    PHYSICAL EXAMINATION: General:  Laying on stretcher, cachectic, appears quite anxious Neuro:  Alert, oriented HEENT:  Proptosis, PERRL, EOMI, OP clear, dry mucosa Cardiovascular:  Tachycardic, regular rhythm, no MRG Lungs:  Bilateral basilar crackles, tachypneic, no wheeze Abdomen:  Scaphoid, NTND, +BS, no HSM Musculoskeletal:  +pedal edema Skin:   hyperpigmented areas on face and chest   LABS:  CBC  Recent Labs Lab 11/19/2013 2157  WBC 14.1*  HGB 12.4*  HCT 36.6*  PLT PLATELET CLUMPS NOTED ON SMEAR, COUNT APPEARS DECREASED   Coag's No results found for this basename: APTT, INR,  in the last 168 hours BMET  Recent Labs Lab 11/08/2013 2157  NA 135*  K 4.7  CL 95*  CO2 14*  BUN 74*  CREATININE 1.86*  GLUCOSE 192*   Electrolytes  Recent Labs Lab 11/04/2013 2157  CALCIUM 7.5*   Sepsis Markers  Recent Labs Lab 12/01/2013 2206  LATICACIDVEN 2.06   ABG  Recent Labs Lab 11/16/2013 2158  PHART 7.466*  PCO2ART 24.4*  PO2ART 130.0*   Liver Enzymes  Recent Labs Lab 12/01/2013 2157  AST 87*  ALT 16  ALKPHOS 93  BILITOT 1.1  ALBUMIN 1.7*   Cardiac Enzymes No results found for this basename: TROPONINI, PROBNP,  in the last 168 hours Glucose No results found for this basename: GLUCAP,  in the last 168 hours  Imaging Dg Chest Port 1 View  11/16/2013   CLINICAL DATA:  Could sepsis.  Shortness of breath and dysphagia.  EXAM: PORTABLE  CHEST - 1 VIEW  COMPARISON:  11/04/2013  FINDINGS: Normal heart size and pulmonary vascularity. There is increased interstitial infiltration throughout both lungs which appears increased since previous study. This may indicate interstitial pneumonitis or interstitial edema. No pneumothorax. No blunting of costophrenic angles.  IMPRESSION: Increased interstitial changes throughout the lungs suggesting interstitial pneumonitis versus edema.   Electronically Signed   By: Burman Nieves M.D.   On: 11/22/2013 22:33     ASSESSMENT / PLAN:  PULMONARY  A: Respiratory alkalosis, pulmonary edema, ? pneumonia P:   - Etiology of respiratory alkalosis is unclear at this  time.  It can be seen in febrile patients, Mr. Hockett's temp was 38.6C at the time of his ABG. Differential diagnosis includes; salicilate toxicity in combination with a metabolic acidosis (salicilate level is pending); thyrotoxicosis - TSH and Free T4 are pending, of note Mr. Rochel does have significant proptosis; CHF - echo pending; liver disease - LFTs relatively normal. - Pulmonary edema - Wean O2 as able, echo pending to rule out heart failure as cause of pulmonary edema, could be 2/2 poor nutritional status and hypoalbuminemia. - Will treat for HCAP pending further work up  CARDIOVASCULAR CVL: femoral triple lumen (right) placed by ED A: Tachycardia, hypotension P:  Will give 25g albumin given hypoalbuminemia and poor nutritional status and monitor response May require pressors  RENAL A:  ARF P:   Likely secondary to volume depletion and hypotension Monitor UOP Follow Cr daily  GASTROINTESTINAL A:  No acute issues P:   NPO with ice chips for now  HEMATOLOGIC A:  Thrombocytopenia P:  Mild - unknown etiology, will follow  INFECTIOUS A:  ? HCAP in immunocompromised patient (Humira and prednisone) P:  Will treat for HCAP pending further work up [employer] 7/31 UC 7/31 Sputum - none Abx: vanc/zosyn, start date 8/1,  day 1/7  ENDOCRINE A:  DMII, chronic steroid use   P:   Glucose stabilizer Start hydrocortisone 100mg  q8 Proptosis - concern for possible thyrotoxicosis - TSH and free T4 pending  NEUROLOGIC A:  AMS improved P:   RASS goal: 0-1 Follow mental status  TODAY'S SUMMARY: 58 y/o with FTT, psoriatic arthritis admitted with respiratory alkalosis and metabolic acidosis, possible septic shock, possible HCAP.  I have personally obtained a history, examined the patient, evaluated laboratory and imaging results, formulated the assessment and plan and placed orders. CRITICAL CARE: The patient is critically ill with multiple organ systems failure and requires high complexity decision making for assessment and support, frequent evaluation and titration of therapies, application of advanced monitoring technologies and extensive interpretation of multiple databases. Critical Care Time devoted to patient care services described in this note is 40 minutes.    Pulmonary and Critical Care Medicine [facility] Pager: [phone]  12/03/2013, 1:46 AM

## 2013-12-03 NOTE — Progress Notes (Signed)
ABG    Component Value Date/Time   PHART 7.346* 12/03/2013 0530   PCO2ART 33.5* 12/03/2013 0530   PO2ART 290.0* 12/03/2013 0530   HCO3 17.8* 12/03/2013 0530   TCO2 18.9 12/03/2013 0530   ACIDBASEDEF 6.8* 12/03/2013 0530   O2SAT 99.4 12/03/2013 0530

## 2013-12-03 NOTE — Progress Notes (Signed)
UR Completed.  Samentha Perham Jane [phone] 12/03/2013  

## 2013-12-03 NOTE — Procedures (Signed)
Central Venous Catheter Insertion Procedure Note Lawrence Jennings [id number] 16-Jun-1955  Procedure: Insertion of Central Venous Catheter Indications: Assessment of intravascular volume, Drug and/or fluid administration and Frequent blood sampling  Procedure Details Consent: Risks of procedure as well as the alternatives and risks of each were explained to the (patient/caregiver).  Consent for procedure obtained. Time Out: Verified patient identification, verified procedure, site/side was marked, verified correct patient position, special equipment/implants available, medications/allergies/relevent history reviewed, required imaging and test results available.  Performed  Maximum sterile technique was used including antiseptics, cap, gloves, gown, hand hygiene, mask and sheet. Skin prep: Chlorhexidine; local anesthetic administered A antimicrobial bonded/coated triple lumen catheter was placed in the left internal jugular vein using the Seldinger technique. Ultrasound guidance used.Yes.   Catheter placed to 20 cm. Blood aspirated via all 3 ports and then flushed x 3. Line sutured x 2 and dressing applied.  Evaluation Blood flow good Complications: No apparent complications Patient did tolerate procedure well. Chest X-ray ordered to verify placement.  CXR: line in good position.    Brett Canales Minor ACNP [facility] PCCM Pager [phone] till 3 pm If no answer page [phone]  Ultrasound used for site verification, live visualisation of needle entry & guidewire prior to dilation  Lawrence Jennings 12/03/2013, 12:07 PM

## 2013-12-03 NOTE — Progress Notes (Signed)
Note/chart reviewed.  Agree with note. Add Magnesium and Phosphorus labs per 58M PEPuP protocol as pt is severely malnourished.  MD to supplement as needed based on lab results.   Kendell Bane RD, LDN, CNSC [phone] Pager [phone] After Hours Pager

## 2013-12-03 NOTE — Progress Notes (Signed)
PULMONARY / CRITICAL CARE MEDICINE   Name: Lawrence Jennings MRN: [id number] DOB: 1956-03-27    ADMISSION DATE:  12-08-13 CONSULTATION DATE:  12/03/13   REFERRING MD :  ED  CHIEF COMPLAINT:  AMS  INITIAL PRESENTATION: Found by his sister with altered mental status and weakness, brought to [facility] ED via EMS He has psoriatic arthritis and is on Humira and prednisone.  He was admitted from 7/3-7/7 for failure to thrive.  He was treated with doxycycline for possible cellulitis.  He was discharged home on a prednisone taper for his arthritis and is on 20mg  daily. PMH - mild thrombocytopenia of unknown etiology,diabetes that has been poorly controlled.  Intubated for ALI & hypoxic resp failure  STUDIES:  CXR - Interstitial infiltrates  SIGNIFICANT EVENTS: 12/03/13 - admitted to ICU   SUBJ - sedated On levo gt  afebrile Oliguric   VITAL SIGNS: Temp:  [97.8 F (36.6 C)-101.4 F (38.6 C)] 97.8 F (36.6 C) (08/01 0800) Pulse Rate:  [25-127] 91 (08/01 0811) Resp:  [16-42] 24 (08/01 0811) BP: (73-126)/(49-101) 93/55 mmHg (08/01 0811) SpO2:  [80 %-100 %] 100 % (08/01 0816) FiO2 (%):  [50 %-100 %] 50 % (08/01 0816) Weight:  [65.7 kg (144 lb 13.5 oz)-71.215 kg (157 lb)] 65.7 kg (144 lb 13.5 oz) (08/01 0456) HEMODYNAMICS:   VENTILATOR SETTINGS: Vent Mode:  [-] PRVC FiO2 (%):  [50 %-100 %] 50 % Set Rate:  [24 bmp] 24 bmp Vt Set:  [600 mL] 600 mL PEEP:  [5 cmH20] 5 cmH20 Plateau Pressure:  [22 cmH20-27 cmH20] 22 cmH20 INTAKE / OUTPUT:  Intake/Output Summary (Last 24 hours) at 12/03/13 1011 Last data filed at 12/03/13 0800  Gross per 24 hour  Intake 1868.48 ml  Output    675 ml  Net 1193.48 ml    PHYSICAL EXAMINATION: General: acutely ill,  cachectic, sedated Neuro:  Sedated, RASS -3 HEENT:  Proptosis, PERRL, EOMI, OP clear, dry mucosa Cardiovascular:  Tachycardic, regular rhythm, no MRG Lungs:  Bilateral basilar crackles, tachypneic, no wheeze Abdomen:  Scaphoid, NTND, +BS, no  HSM Musculoskeletal:  +pedal edema Skin:   hyperpigmented areas on face and chest   LABS:  CBC  Recent Labs Lab Dec 08, 2013 2157 12/03/13 0408  WBC 14.1* 9.1  HGB 12.4* 9.4*  HCT 36.6* 28.1*  PLT PLATELET CLUMPS NOTED ON SMEAR, COUNT APPEARS DECREASED 88*   Coag's  Recent Labs Lab 12/08/13 2157  INR 1.20   BMET  Recent Labs Lab 12-08-13 2157 12/03/13 0408  NA 135* 139  K 4.7 3.5*  CL 95* 105  CO2 14* 17*  BUN 74* 62*  CREATININE 1.86* 1.52*  GLUCOSE 192* 165*   Electrolytes  Recent Labs Lab 12/08/2013 2157 12/03/13 0408  CALCIUM 7.5* 6.7*   Sepsis Markers  Recent Labs Lab Dec 08, 2013 2157 2013/12/08 2206  LATICACIDVEN  --  2.06  PROCALCITON 1.64  --    ABG  Recent Labs Lab 12/08/2013 2158 12/03/13 0530  PHART 7.466* 7.346*  PCO2ART 24.4* 33.5*  PO2ART 130.0* 290.0*   Liver Enzymes  Recent Labs Lab Dec 08, 2013 2157  AST 87*  ALT 16  ALKPHOS 93  BILITOT 1.1  ALBUMIN 1.7*   Cardiac Enzymes No results found for this basename: TROPONINI, PROBNP,  in the last 168 hours Glucose  Recent Labs Lab 12/03/13 0235 12/03/13 0734  GLUCAP 183* 163*    Imaging Dg Chest Port 1 View  12-08-2013   CLINICAL DATA:  Could sepsis.  Shortness of breath and dysphagia.  EXAM:  PORTABLE CHEST - 1 VIEW  COMPARISON:  11/04/2013  FINDINGS: Normal heart size and pulmonary vascularity. There is increased interstitial infiltration throughout both lungs which appears increased since previous study. This may indicate interstitial pneumonitis or interstitial edema. No pneumothorax. No blunting of costophrenic angles.  IMPRESSION: Increased interstitial changes throughout the lungs suggesting interstitial pneumonitis versus edema.   Electronically Signed   By: Burman Nieves M.D.   On: 12/21/13 22:33     ASSESSMENT / PLAN:  PULMONARY  A: Acute resp failure Favor HCAP & ALI  Resp alkalosis - salicylate, TSH levels nml P:   - Will treat for HCAP -daily wua, vent  protocol -decrease FIO2  CARDIOVASCULAR CVL: femoral triple lumen (right) placed by ED A: Septic shock P: levophed gtt Needs IJ CVL to chk CVP - to direct volume resus   RENAL A:  AKI  P:   Likely secondary to volume depletion and hypotension Monitor UOP Follow Cr daily  GASTROINTESTINAL A:  No acute issues P:   Start TFs  HEMATOLOGIC A:  Thrombocytopenia P:  Mild - unknown etiology, will follow  INFECTIOUS A:  HCAP in immunocompromised patient (Humira and prednisone) P:  Will treat for HCAP pending further work up [employer] 7/31 UC 7/31 Sputum - none Abx: vanc/zosyn, start date 8/1, day 1/7  ENDOCRINE A:  DMII, chronic steroid use   Proptosis - concern for possible thyrotoxicosis - TSH nml  P:   SSI Start hydrocortisone 100mg  q8   NEUROLOGIC A:  AMS improved P:   RASS goal: 0-1 Daily WUA  TODAY'S SUMMARY: 58 y/o with FTT, psoriatic arthritis admitted with HCAP, AKI  ,septic shock  I have personally obtained a history, examined the patient, evaluated laboratory and imaging results, formulated the assessment and plan and placed orders. CRITICAL CARE: The patient is critically ill with multiple organ systems failure and requires high complexity decision making for assessment and support, frequent evaluation and titration of therapies, application of advanced monitoring technologies and extensive interpretation of multiple databases. Critical Care Time devoted to patient care services described in this note is 40 minutes.   60 MD. Cyril Mourning. [place] [facility] Pager [phone] If no response call [phone]    12/03/2013, 10:11 AM

## 2013-12-03 NOTE — Progress Notes (Signed)
eLink Physician-Brief Progress Note Patient Name: Lawrence Jennings DOB: 1955/09/06 MRN: [id number]  Date of Service  12/03/2013   HPI/Events of Note   Septic shock CVP 3 Levophed 15 Propofol gtt  eICU Interventions  Bolus 500cc now If no improvement, change propofol to int versed   Intervention Category Major Interventions: Shock - evaluation and management  MCQUAID, DOUGLAS 12/03/2013, 4:13 PM

## 2013-12-03 NOTE — Progress Notes (Addendum)
INITIAL NUTRITION ASSESSMENT  Pt meets criteria for SEVERE MALNUTRITION in the context of chronic illness as evidenced by severe fat and muscle mass depletion.  DOCUMENTATION CODES Per approved criteria  -Severe malnutrition in the context of chronic illness   INTERVENTION: Utilize 60M PEPuP Protocol: initiate TF via OG with Oxepa at 25 ml/h and Prostat 30 ml BID on day 1; on day 2, increase to goal rate of 45 ml/h (1080 ml per day) and provide Prostat 30 ml BID daily to provide 1820 kcals, 98 gm protein, 1272 ml free water daily.  Total tube feeding regimen with current propofol rate provides 2029 kcals (105% of estimated kcal needs), 98 grams of protein (108% of estimated minimum protein needs), and 1272 ml of free water daily.  Monitor magnesium, potassium, and phosphorus daily for at least 3 days, MD to replete as needed, as pt is at risk for refeeding syndrome given severe malnutrition.  NUTRITION DIAGNOSIS: Inadequate oral intake related to inability to eat as evidenced by NPO status  Goal: Pt to meet >/= 90% of their estimated nutrition needs.  Monitor:  Vent status, TF initiation and tolerance, weight trends, labs, I/O's  Reason for Assessment: MD consult to manage and adjust tube feeding formula and rate  58 y.o. male  Admitting Dx: Septic shock, ALI  ASSESSMENT: Pt with  FTT, psoriatic arthritis admitted with HCAP, AKI, acute lung injury, and septic shock. Pt on chronic steroids. He also has a history of hypertension and diabetes that has been poorly controlled.   Patient is currently intubated on ventilator support MV: 15.9 L/min Temp (24hrs), Avg:98.7 F (37.1 C), Min:97.8 F (36.6 C), Max:101.4 F (38.6 C)  Propofol: 7.9 ml/hr providing 209 kcals  Unable to obtain pt nutrition history.   Nutrition Focused Physical Exam:  Subcutaneous Fat:  Orbital Region: N/A Upper Arm Region: Moderate depletion Thoracic and Lumbar Region: Severe depletion  Muscle:   Temple Region: Severe depletion Clavicle Bone Region: Moderate depletion Clavicle and Acromion Bone Region: Moderate depletion Scapular Bone Region: N/A Dorsal Hand: N/A Patellar Region: Severe depletion Anterior Thigh Region: Severe depletion Posterior Calf Region: Severe depletion  Edema: none  Labs:Low potassium, CO2, calcium, and GFR High glucose (165mg /dL), BUN, and creatinine   Height: Ht Readings from Last 1 Encounters:  11/25/2013 5' 11.65" (1.82 m)    Weight: Wt Readings from Last 1 Encounters:  12/03/13 144 lb 13.5 oz (65.7 kg)    Ideal Body Weight: 176 lbs  % Ideal Body Weight: 82%  Wt Readings from Last 10 Encounters:  12/03/13 144 lb 13.5 oz (65.7 kg)  11/08/13 157 lb 6.4 oz (71.396 kg)  10/24/13 161 lb (73.029 kg)  10/14/13 159 lb 9.6 oz (72.394 kg)  10/08/13 161 lb 13.1 oz (73.4 kg)  10/05/13 162 lb 8 oz (73.71 kg)    Usual Body Weight: 190 lbs (reported from previous admission 7/5)  % Usual Body Weight: 76%  BMI:  Body mass index is 19.83 kg/(m^2).  Estimated Nutritional Needs: Kcal: 1939 Protein: 98-115 grams Fluid: > 1.9 L/day  Skin: Stage II pressure ulcer on left buttocks  Diet Order: NPO  EDUCATION NEEDS: -Education not appropriate at this time   Intake/Output Summary (Last 24 hours) at 12/03/13 1029 Last data filed at 12/03/13 0800  Gross per 24 hour  Intake 1868.48 ml  Output    675 ml  Net 1193.48 ml    Last BM: PTA   Labs:   Recent Labs Lab 11/17/2013 2157 12/03/13 0408  NA 135* 139  K 4.7 3.5*  CL 95* 105  CO2 14* 17*  BUN 74* 62*  CREATININE 1.86* 1.52*  CALCIUM 7.5* 6.7*  GLUCOSE 192* 165*    CBG (last 3)   Recent Labs  12/03/13 0235 12/03/13 0734  GLUCAP 183* 163*    Scheduled Meds: . feeding supplement (OXEPA)  1,000 mL Per Tube Q24H  . hydrocortisone sod succinate (SOLU-CORTEF) inj  100 mg Intravenous Q8H  . insulin aspart  2-6 Units Subcutaneous 6 times per day  . ipratropium-albuterol  3 mL  Nebulization Q4H  . micafungin (MYCAMINE) IV  100 mg Intravenous Daily  . pantoprazole (PROTONIX) IV  40 mg Intravenous QHS  . piperacillin-tazobactam (ZOSYN)  IV  3.375 g Intravenous Q8H  . propofol      . sodium chloride  500 mL Intravenous Once  . vancomycin  750 mg Intravenous Q12H    Continuous Infusions: . sodium chloride 125 mL/hr at 12/03/13 0800  . fentaNYL infusion INTRAVENOUS 100 mcg/hr (12/03/13 0800)  . norepinephrine (LEVOPHED) Adult infusion 10.027 mcg/min (12/03/13 0800)  . propofol 20 mcg/kg/min (12/03/13 0700)    Past Medical History  Diagnosis Date  . Allergic reaction   . Sinus disease   . Back pain   . Arthritis   . Edema   . Hypertension   . Diabetes mellitus without complication     insulin/metformin    Past Surgical History  Procedure Laterality Date  . No past surgeries      Marijean Niemann, MS, Provisional LDN Pager # [phone] After hours/ weekend pager # [phone]

## 2013-12-03 NOTE — Progress Notes (Signed)
Titrated pressure down until where pt said he could tolerate it. It was "too much pressure" initially. Pt ventilating well.

## 2013-12-03 NOTE — ED Notes (Signed)
Pt refuses foley catheter at this time

## 2013-12-03 NOTE — Procedures (Signed)
Intubation Procedure Note Rishik Tubby [id number] 1955-05-23  Procedure: Intubation Indications: Respiratory insufficiency  Procedure Details Consent: Unable to obtain consent because of emergent medical necessity. Time Out: Verified patient identification, verified procedure, site/side was marked, verified correct patient position, special equipment/implants available, medications/allergies/relevent history reviewed, required imaging and test results available.  Performed  Maximum sterile technique was used including gloves and hand hygiene.  MAC and 3    Evaluation Hemodynamic Status: BP stable throughout; O2 sats: stable throughout Patient's Current Condition: stable Complications: No apparent complications Patient did tolerate procedure well. Chest X-ray ordered to verify placement.  CXR: pending.   GIDDINGS, OLIVIA K. 12/03/2013

## 2013-12-03 NOTE — Progress Notes (Signed)
Patient refusing urinary catheter. Educated on benefits of catheter, continues to refuse.

## 2013-12-03 NOTE — Progress Notes (Signed)
20 mg Etomidate and 100 mg Succinylcholine given during intubation per MD

## 2013-12-03 DEATH — deceased

## 2013-12-04 ENCOUNTER — Inpatient Hospital Stay (HOSPITAL_COMMUNITY): Payer: Medicaid Other

## 2013-12-04 DIAGNOSIS — I517 Cardiomegaly: Secondary | ICD-10-CM

## 2013-12-04 LAB — CBC
HCT: 29.2 % — ABNORMAL LOW (ref 39.0–52.0)
HEMATOCRIT: 29.5 % — AB (ref 39.0–52.0)
HEMOGLOBIN: 9.6 g/dL — AB (ref 13.0–17.0)
Hemoglobin: 9.5 g/dL — ABNORMAL LOW (ref 13.0–17.0)
MCH: 27.6 pg (ref 26.0–34.0)
MCH: 27.6 pg (ref 26.0–34.0)
MCHC: 32.5 g/dL (ref 30.0–36.0)
MCHC: 32.5 g/dL (ref 30.0–36.0)
MCV: 84.9 fL (ref 78.0–100.0)
MCV: 84.8 fL (ref 78.0–100.0)
PLATELETS: 92 10*3/uL — AB (ref 150–400)
Platelets: 95 10*3/uL — ABNORMAL LOW (ref 150–400)
RBC: 3.48 MIL/uL — ABNORMAL LOW (ref 4.22–5.81)
RBC: 3.44 MIL/uL — ABNORMAL LOW (ref 4.22–5.81)
RDW: 18 % — ABNORMAL HIGH (ref 11.5–15.5)
RDW: 17.9 % — AB (ref 11.5–15.5)
WBC: 15.2 10*3/uL — ABNORMAL HIGH (ref 4.0–10.5)
WBC: 15.2 10*3/uL — AB (ref 4.0–10.5)

## 2013-12-04 LAB — BASIC METABOLIC PANEL
ANION GAP: 16 — AB (ref 5–15)
Anion gap: 17 — ABNORMAL HIGH (ref 5–15)
BUN: 43 mg/dL — ABNORMAL HIGH (ref 6–23)
BUN: 40 mg/dL — AB (ref 6–23)
CALCIUM: 6.9 mg/dL — AB (ref 8.4–10.5)
CHLORIDE: 110 meq/L (ref 96–112)
CO2: 17 mEq/L — ABNORMAL LOW (ref 19–32)
CO2: 20 mEq/L (ref 19–32)
CREATININE: 1.11 mg/dL (ref 0.50–1.35)
Calcium: 7 mg/dL — ABNORMAL LOW (ref 8.4–10.5)
Chloride: 110 mEq/L (ref 96–112)
Creatinine, Ser: 1.22 mg/dL (ref 0.50–1.35)
GFR calc Af Amer: 75 mL/min — ABNORMAL LOW (ref >90)
GFR calc non Af Amer: 65 mL/min — ABNORMAL LOW (ref >90)
GFR calc non Af Amer: 72 mL/min — ABNORMAL LOW (ref >90)
GFR, EST AFRICAN AMERICAN: 84 mL/min — AB (ref >90)
GLUCOSE: 226 mg/dL — AB (ref 70–99)
Glucose, Bld: 181 mg/dL — ABNORMAL HIGH (ref 70–99)
Potassium: 3.3 mEq/L — ABNORMAL LOW (ref 3.7–5.3)
Potassium: 3.1 mEq/L — ABNORMAL LOW (ref 3.7–5.3)
SODIUM: 144 meq/L (ref 137–147)
Sodium: 146 mEq/L (ref 137–147)

## 2013-12-04 LAB — GLUCOSE, CAPILLARY
GLUCOSE-CAPILLARY: 141 mg/dL — AB (ref 70–99)
GLUCOSE-CAPILLARY: 200 mg/dL — AB (ref 70–99)
GLUCOSE-CAPILLARY: 96 mg/dL (ref 70–99)
Glucose-Capillary: 188 mg/dL — ABNORMAL HIGH (ref 70–99)
Glucose-Capillary: 82 mg/dL (ref 70–99)
Glucose-Capillary: 103 mg/dL — ABNORMAL HIGH (ref 70–99)

## 2013-12-04 LAB — PHOSPHORUS
Phosphorus: 3.5 mg/dL (ref 2.3–4.6)
Phosphorus: 2.4 mg/dL (ref 2.3–4.6)

## 2013-12-04 LAB — URINE CULTURE
Colony Count: NO GROWTH
Culture: NO GROWTH

## 2013-12-04 LAB — MAGNESIUM
MAGNESIUM: 2.4 mg/dL (ref 1.5–2.5)
Magnesium: 2.5 mg/dL (ref 1.5–2.5)

## 2013-12-04 MED ORDER — PANTOPRAZOLE SODIUM 40 MG PO PACK
40.0000 mg | PACK | Freq: Every day | ORAL | Status: DC
  Administered 2013-12-04 – 2013-12-05 (×2): 40 mg
  Filled 2013-12-04 (×2): qty 20

## 2013-12-04 MED ORDER — SODIUM CHLORIDE 0.9 % IV BOLUS (SEPSIS)
500.0000 mL | Freq: Once | INTRAVENOUS | Status: AC
Start: 2013-12-04 — End: 2013-12-04
  Administered 2013-12-04: 500 mL via INTRAVENOUS

## 2013-12-04 MED ORDER — SODIUM CHLORIDE 0.9 % IV SOLN
INTRAVENOUS | Status: DC
  Administered 2013-12-05: 1000 mL via INTRAVENOUS

## 2013-12-04 MED ORDER — POTASSIUM CHLORIDE 10 MEQ/50ML IV SOLN
10.0000 meq | INTRAVENOUS | Status: AC
  Administered 2013-12-04 (×2): 10 meq via INTRAVENOUS
  Filled 2013-12-04 (×2): qty 50

## 2013-12-04 NOTE — Progress Notes (Signed)
12/04/13 0035  RN communicated with eLink MD Delford Field about Pt oozing blood from Left IJ CVC insertion site, thrombin patch has been applied. Pressure dressing applied. Sandbag applied. Ice pack applied. Still oozing from site.   RN to continue to monitor.   Elisha Headland RN

## 2013-12-04 NOTE — Progress Notes (Signed)
Echocardiogram 2D Echocardiogram has been performed.  Dorothey Baseman 12/04/2013, 12:05 PM

## 2013-12-04 NOTE — Progress Notes (Signed)
eLink Physician-Brief Progress Note Patient Name: Terrill Alperin DOB: [birth year] MRN: [id number]  Date of Service  12/04/2013   HPI/Events of Note   K 3.3  eICU Interventions  KCL iv given   Intervention Category Major Interventions: Electrolyte abnormality - evaluation and management  Shan Levans 12/04/2013, 5:08 AM

## 2013-12-04 NOTE — Progress Notes (Signed)
PULMONARY / CRITICAL CARE MEDICINE   Name: Lawrence Jennings MRN: [id number] DOB: 04/07/1956    ADMISSION DATE:  Dec 05, 2013 CONSULTATION DATE:  12/03/13   REFERRING MD :  ED  CHIEF COMPLAINT:  AMS  INITIAL PRESENTATION: Found by his sister with altered mental status and weakness, brought to [facility] ED via EMS He has psoriatic arthritis and is on Humira and prednisone.  He was admitted from 7/3-7/7 for failure to thrive.  He was treated with doxycycline for possible cellulitis.  He was discharged home on a prednisone taper for his arthritis and is on 20mg  daily. PMH - mild thrombocytopenia of unknown etiology,diabetes that has been poorly controlled.  Intubated for ALI & hypoxic resp failure  STUDIES:  Echo 8/2 EF 60%  SIGNIFICANT EVENTS: 12/03/13 - admitted to ICU   SUBJ - remains sedated, critically ill  On levo gtt  afebrile Oliguric   VITAL SIGNS: Temp:  [96.2 F (35.7 C)-99.6 F (37.6 C)] 99.6 F (37.6 C) (08/02 1500) Pulse Rate:  [47-112] 99 (08/02 1530) Resp:  [14-24] 21 (08/02 1530) BP: (79-140)/(52-86) 92/59 mmHg (08/02 1530) SpO2:  [93 %-100 %] 95 % (08/02 1530) FiO2 (%):  [40 %] 40 % (08/02 1500) Weight:  [65.7 kg (144 lb 13.5 oz)] 65.7 kg (144 lb 13.5 oz) (08/02 0458) HEMODYNAMICS: CVP:  [2 mmHg-5 mmHg] 2 mmHg VENTILATOR SETTINGS: Vent Mode:  [-] PRVC FiO2 (%):  [40 %] 40 % Set Rate:  [24 bmp] 24 bmp Vt Set:  [600 mL] 600 mL PEEP:  [5 cmH20] 5 cmH20 Plateau Pressure:  [20 cmH20-24 cmH20] 22 cmH20 INTAKE / OUTPUT:  Intake/Output Summary (Last 24 hours) at 12/04/13 1612 Last data filed at 12/04/13 1600  Gross per 24 hour  Intake 2709.03 ml  Output    995 ml  Net 1714.03 ml    PHYSICAL EXAMINATION: General: acutely ill,  cachectic, sedated Neuro:  Sedated, RASS -3 HEENT:  Proptosis, PERRL, EOMI, OP clear, dry mucosa Cardiovascular:  Tachycardic, regular rhythm, no MRG Lungs:  Bilateral basilar crackles, tachypneic, no wheeze Abdomen:  Scaphoid, NTND,  +BS, no HSM Musculoskeletal:  +pedal edema Skin:   hyperpigmented areas on face and chest   LABS:  CBC  Recent Labs Lab 12/03/13 0408 12/04/13 0050 12/04/13 0500  WBC 9.1 15.2* 15.2*  HGB 9.4* 9.6* 9.5*  HCT 28.1* 29.5* 29.2*  PLT 88* 95* 92*   Coag's  Recent Labs Lab 12/05/13 2157  INR 1.20   BMET  Recent Labs Lab 12/03/13 0408 12/04/13 0050 12/04/13 0500  NA 139 144 146  K 3.5* 3.3* 3.1*  CL 105 110 110  CO2 17* 17* 20  BUN 62* 43* 40*  CREATININE 1.52* 1.22 1.11  GLUCOSE 165* 226* 181*   Electrolytes  Recent Labs Lab 12/03/13 0408 12/03/13 1250 12/04/13 0050 12/04/13 0500 12/04/13 1127  CALCIUM 6.7*  --  7.0* 6.9*  --   MG  --  2.5 2.5  --  2.4  PHOS  --  4.6 3.5  --  2.4   Sepsis Markers  Recent Labs Lab 2013/12/05 2157 2013-12-05 2206  LATICACIDVEN  --  2.06  PROCALCITON 1.64  --    ABG  Recent Labs Lab 12-05-2013 2158 12/03/13 0530  PHART 7.466* 7.346*  PCO2ART 24.4* 33.5*  PO2ART 130.0* 290.0*   Liver Enzymes  Recent Labs Lab Dec 05, 2013 2157  AST 87*  ALT 16  ALKPHOS 93  BILITOT 1.1  ALBUMIN 1.7*   Cardiac Enzymes No results found for this basename:  TROPONINI, PROBNP,  in the last 168 hours Glucose  Recent Labs Lab 12/03/13 1121 12/03/13 1540 12/03/13 1943 12/04/13 0001 12/04/13 0417 12/04/13 0811  GLUCAP 197* 235* 247* 200* 188* 141*    Imaging Dg Chest Port 1 View  12/03/2013   CLINICAL DATA:  Central line placement.  EXAM: PORTABLE CHEST - 1 VIEW  COMPARISON:  Earlier today.  FINDINGS: The endotracheal tube remains in satisfactory position. Interval left jugular catheter with its tip in the superior vena cava. No pneumothorax. Normal-sized heart. Stable bilateral airspace opacity and prominent interstitial markings. No pleural fluid. Nasogastric tube extending into the stomach with the side hole in the distal esophagus.  IMPRESSION: 1. Left jugular catheter tip in the superior vena cava without pneumothorax. 2.  Stable bilateral pulmonary edema or pneumonitis.   Electronically Signed   By: Gordan Payment M.D.   On: 12/03/2013 13:08   Dg Chest Port 1 View  12/03/2013   CLINICAL DATA:  Endotracheal tube placement.  EXAM: PORTABLE CHEST - 1 VIEW  COMPARISON:  12/03/13  FINDINGS: Interval placement of an endotracheal tube with tip measuring 5.9 cm above the carinal. An enteric tube was placed. The tip is off the field of view but below the left hemidiaphragm. Heart size is normal. Increasing bilateral pulmonary infiltrates. No pleural effusion. No pneumothorax.  IMPRESSION: Endotracheal tube tip measures 5.9 cm above the carina. Increasing bilateral pulmonary infiltrates.   Electronically Signed   By: Burman Nieves M.D.   On: 12/03/2013 05:22   Dg Abd Portable 1v  12/03/2013   CLINICAL DATA:  OG tube insertion.  EXAM: PORTABLE ABDOMEN - 1 VIEW  COMPARISON:  None.  FINDINGS: Enteric tube tip is in the left upper quadrant consistent with location in the upper stomach. Proximal side hole appears to be just below the GE junction. Catheter projected over the right pelvis probably represents a right femoral central venous catheter. Bowel gas pattern is unremarkable with scattered gas in the colon. No small or large bowel distention.  IMPRESSION: Enteric tube tip is projected over the upper stomach.   Electronically Signed   By: Burman Nieves M.D.   On: 12/03/2013 05:24     ASSESSMENT / PLAN:  PULMONARY  A: Acute resp failure Favor HCAP & ALI  Resp alkalosis - salicylate, TSH levels nml P:   -daily wua, vent protocol  -Start SBTs once pressors lower  CARDIOVASCULAR CVL: femoral triple lumen (right) placed by ED A: Septic shock P: levophed gtt Fluid bolus to get CVp up & come off pressors   RENAL A:  AKI -resolving Hypokalemia P:   Likely secondary to volume depletion and hypotension Monitor UOP Replete K   GASTROINTESTINAL A:  No acute issues P:   Start TFs  HEMATOLOGIC A:   Thrombocytopenia Mild - unknown etiology P:  - follow   INFECTIOUS A:  HCAP in immunocompromised patient (Humira and prednisone) P:  Will treat for HCAP pending further work up [employer] 7/31 >> ng UC 7/31 >> ng Sputum - none Abx: ct zosyn, start date 8/1 Dc vanc  ENDOCRINE A:  DMII, chronic steroid use   Proptosis - concern for possible thyrotoxicosis - TSH nml  P:   SSI Taper hydrocortisone 100mg  q8 once off pressors   NEUROLOGIC A:  AMS improved P:   RASS goal: 0-1 Daily WUA  TODAY'S SUMMARY: 58 y/o with FTT, psoriatic arthritis admitted with HCAP, AKI  ,septic shock  I have personally obtained a history, examined the patient, evaluated  laboratory and imaging results, formulated the assessment and plan and placed orders. CRITICAL CARE: The patient is critically ill with multiple organ systems failure and requires high complexity decision making for assessment and support, frequent evaluation and titration of therapies, application of advanced monitoring technologies and extensive interpretation of multiple databases. Critical Care Time devoted to patient care services described in this note is 40 minutes.   Cyril Mourning MD. Tonny Bollman. [place] [facility] Pager [phone] If no response call [phone]    12/04/2013, 4:12 PM

## 2013-12-04 NOTE — Progress Notes (Signed)
[facility] ADULT ICU REPLACEMENT PROTOCOL FOR AM LAB REPLACEMENT ONLY  The patient does not apply for the [facility] Adult ICU Electrolyte Replacment Protocol based on the criteria listed below:   1. Is GFR >/= 40 ml/min? Yes.    Patient's GFR today is 65 2. Is urine output >/= 0.5 ml/kg/hr for the last 6 hours? No. Patient's UOP is  None recorded ml/kg/hr 3. Is BUN < 60 mg/dL? Yes.    Patient's BUN today is 43 4. Abnormal electrolyte(s): K+3.3 5. Ordered repletion with: NA 6. If a panic level lab has been reported, has the CCM MD in charge been notified? Yes.  .   Physician:  Philis Nettle, Renae Fickle Hilliard 12/04/2013 5:04 AM

## 2013-12-05 ENCOUNTER — Inpatient Hospital Stay (HOSPITAL_COMMUNITY): Payer: Medicaid Other

## 2013-12-05 DIAGNOSIS — E860 Dehydration: Secondary | ICD-10-CM

## 2013-12-05 DIAGNOSIS — J189 Pneumonia, unspecified organism: Secondary | ICD-10-CM

## 2013-12-05 LAB — GLUCOSE, CAPILLARY
GLUCOSE-CAPILLARY: 193 mg/dL — AB (ref 70–99)
GLUCOSE-CAPILLARY: 124 mg/dL — AB (ref 70–99)
Glucose-Capillary: 100 mg/dL — ABNORMAL HIGH (ref 70–99)
Glucose-Capillary: 155 mg/dL — ABNORMAL HIGH (ref 70–99)
Glucose-Capillary: 211 mg/dL — ABNORMAL HIGH (ref 70–99)
Glucose-Capillary: 88 mg/dL (ref 70–99)

## 2013-12-05 LAB — BASIC METABOLIC PANEL
Anion gap: 15 (ref 5–15)
BUN: 29 mg/dL — ABNORMAL HIGH (ref 6–23)
CO2: 20 mEq/L (ref 19–32)
Calcium: 6.6 mg/dL — ABNORMAL LOW (ref 8.4–10.5)
Chloride: 117 mEq/L — ABNORMAL HIGH (ref 96–112)
Creatinine, Ser: 1.01 mg/dL (ref 0.50–1.35)
GFR, EST NON AFRICAN AMERICAN: 81 mL/min — AB (ref >90)
Glucose, Bld: 232 mg/dL — ABNORMAL HIGH (ref 70–99)
Potassium: 3.1 mEq/L — ABNORMAL LOW (ref 3.7–5.3)
SODIUM: 152 meq/L — AB (ref 137–147)

## 2013-12-05 LAB — PHOSPHORUS
Phosphorus: 2.6 mg/dL (ref 2.3–4.6)
Phosphorus: 2.2 mg/dL — ABNORMAL LOW (ref 2.3–4.6)

## 2013-12-05 LAB — CBC
HCT: 25.4 % — ABNORMAL LOW (ref 39.0–52.0)
Hemoglobin: 8.2 g/dL — ABNORMAL LOW (ref 13.0–17.0)
MCH: 27.8 pg (ref 26.0–34.0)
MCHC: 32.3 g/dL (ref 30.0–36.0)
MCV: 86.1 fL (ref 78.0–100.0)
PLATELETS: 74 10*3/uL — AB (ref 150–400)
RBC: 2.95 MIL/uL — AB (ref 4.22–5.81)
RDW: 18.4 % — ABNORMAL HIGH (ref 11.5–15.5)
WBC: 11.7 10*3/uL — ABNORMAL HIGH (ref 4.0–10.5)

## 2013-12-05 LAB — CULTURE, GROUP A STREP

## 2013-12-05 LAB — MAGNESIUM
MAGNESIUM: 2.3 mg/dL (ref 1.5–2.5)
Magnesium: 2.2 mg/dL (ref 1.5–2.5)

## 2013-12-05 LAB — TRIGLYCERIDES: Triglycerides: 450 mg/dL — ABNORMAL HIGH (ref <150)

## 2013-12-05 MED ORDER — CHLORHEXIDINE GLUCONATE 0.12 % MT SOLN
15.0000 mL | Freq: Two times a day (BID) | OROMUCOSAL | Status: DC
  Administered 2013-12-05 – 2013-12-17 (×25): 15 mL via OROMUCOSAL
  Filled 2013-12-05 (×21): qty 15

## 2013-12-05 MED ORDER — DEXMEDETOMIDINE HCL IN NACL 200 MCG/50ML IV SOLN
0.2000 ug/kg/h | INTRAVENOUS | Status: AC
  Administered 2013-12-05: 0.6 ug/kg/h via INTRAVENOUS
  Administered 2013-12-05: 0.2 ug/kg/h via INTRAVENOUS
  Administered 2013-12-06 (×4): 0.7 ug/kg/h via INTRAVENOUS
  Filled 2013-12-05 (×7): qty 50

## 2013-12-05 MED ORDER — CETYLPYRIDINIUM CHLORIDE 0.05 % MT LIQD: 7.0000 mL | Freq: Four times a day (QID) | OROMUCOSAL | Status: DC

## 2013-12-05 MED ORDER — MIDAZOLAM HCL 2 MG/2ML IJ SOLN: 1.0000 mg | INTRAMUSCULAR | Status: DC | PRN

## 2013-12-05 MED ORDER — FREE WATER
300.0000 mL | Freq: Four times a day (QID) | Status: DC
  Administered 2013-12-05 – 2013-12-06 (×3): 300 mL

## 2013-12-05 MED ORDER — SODIUM CHLORIDE 0.9 % IV SOLN
INTRAVENOUS | Status: DC
  Administered 2013-12-05: 1000 mL via INTRAVENOUS

## 2013-12-05 MED ORDER — CETYLPYRIDINIUM CHLORIDE 0.05 % MT LIQD
7.0000 mL | Freq: Four times a day (QID) | OROMUCOSAL | Status: DC
  Administered 2013-12-05 – 2013-12-17 (×48): 7 mL via OROMUCOSAL

## 2013-12-05 MED ORDER — MIDAZOLAM HCL 2 MG/2ML IJ SOLN
1.0000 mg | INTRAMUSCULAR | Status: DC | PRN
  Administered 2013-12-06 – 2013-12-08 (×11): 2 mg via INTRAVENOUS
  Filled 2013-12-05 (×11): qty 2

## 2013-12-05 MED ORDER — POTASSIUM CHLORIDE 20 MEQ/15ML (10%) PO LIQD
40.0000 meq | Freq: Every day | ORAL | Status: DC
Start: 2013-12-05 — End: 2013-12-06
  Administered 2013-12-05: 40 meq
  Filled 2013-12-05 (×2): qty 30

## 2013-12-05 MED ORDER — PANTOPRAZOLE SODIUM 40 MG PO PACK
40.0000 mg | PACK | Freq: Two times a day (BID) | ORAL | Status: DC
  Administered 2013-12-05: 40 mg
  Filled 2013-12-05 (×3): qty 20

## 2013-12-05 MED ORDER — ARTIFICIAL TEARS OP OINT
TOPICAL_OINTMENT | Freq: Three times a day (TID) | OPHTHALMIC | Status: DC
  Administered 2013-12-05 (×2): via OPHTHALMIC
  Administered 2013-12-06: 1 via OPHTHALMIC
  Administered 2013-12-06 – 2013-12-11 (×15): via OPHTHALMIC
  Administered 2013-12-11: 1 via OPHTHALMIC
  Administered 2013-12-11 – 2013-12-12 (×2): via OPHTHALMIC
  Administered 2013-12-12 (×2): 1 via OPHTHALMIC
  Administered 2013-12-13: 07:00:00 via OPHTHALMIC
  Filled 2013-12-05 (×2): qty 3.5

## 2013-12-05 NOTE — Progress Notes (Signed)
[facility] ADULT ICU REPLACEMENT PROTOCOL FOR AM LAB REPLACEMENT ONLY  The patient does not apply for the [facility] Adult ICU Electrolyte Replacment Protocol based on the criteria listed below:   1. Is GFR >/= 40 ml/min? Yes.    Patient's GFR today is 81 2. Is urine output >/= 0.5 ml/kg/hr for the last 6 hours? No. Patient's UOP is none recorded since 21:00 on 08/02 ml/kg/hr 3. Is BUN < 60 mg/dL? Yes.    Patient's BUN today is 29 4. Abnormal electrolyte(s): K+3.1 5. Ordered repletion with: NA 6. If a panic level lab has been reported, has the CCM MD in charge been notified? Yes.  .   Physician:  Boone Master [facility] 12/05/2013 4:15 AM

## 2013-12-05 NOTE — Progress Notes (Signed)
PULMONARY / CRITICAL CARE MEDICINE   Name: Marissa Lowrey MRN: [id number] DOB: [birth year]    ADMISSION DATE:  11/13/2013 CONSULTATION DATE:  12/03/13   REFERRING MD :  ED  CHIEF COMPLAINT:  AMS  INITIAL PRESENTATION: 58 y/o M with PMH of psoriatic arthritis on Humira + Pred, recent admission (7/3-7/7) for FTT & cellulitis (doxy) who presented to [facility] ER on 7/31 after being found by family with AMS & weakness.  Intubated for hypoxic respiratory failure / ALI.    PMH - mild thrombocytopenia of unknown etiology,diabetes that has been poorly controlled.   STUDIES:  8/2 - Echo EF 60%  SIGNIFICANT EVENTS: 8/01 - admitted to ICU for hypoxic respiratory failure 8/03 - levo gtt weaned off  SUBJECTIVE: RN reports hypokalemia, elevated residuals.  Levo weaned off at 10 am   VITAL SIGNS: Temp:  [96.8 F (36 C)-99.6 F (37.6 C)] 98.1 F (36.7 C) (08/03 1300) Pulse Rate:  [67-113] 88 (08/03 1300) Resp:  [14-32] 24 (08/03 1300) BP: (87-122)/(52-72) 106/61 mmHg (08/03 1300) SpO2:  [89 %-100 %] 96 % (08/03 1300) FiO2 (%):  [40 %-50 %] 50 % (08/03 1131) Weight:  [158 lb 15.2 oz (72.1 kg)] 158 lb 15.2 oz (72.1 kg) (08/03 0443)  HEMODYNAMICS: CVP:  [1 mmHg-3 mmHg] 3 mmHg  VENTILATOR SETTINGS: Vent Mode:  [-] PRVC FiO2 (%):  [40 %-50 %] 50 % Set Rate:  [24 bmp] 24 bmp Vt Set:  [600 mL] 600 mL PEEP:  [5 cmH20] 5 cmH20 Plateau Pressure:  [16 cmH20-21 cmH20] 19 cmH20  INTAKE / OUTPUT:  Intake/Output Summary (Last 24 hours) at 12/05/13 1324 Last data filed at 12/05/13 1300  Gross per 24 hour  Intake 4634.26 ml  Output   2175 ml  Net 2459.26 ml    PHYSICAL EXAMINATION: General: acutely ill, cachectic, sedated Neuro:  Sedated, RASS -3 HEENT:  Proptosis, PERRL, EOMI, OP clear, dry mucosa Cardiovascular:  Tachycardic, regular rhythm, no MRG Lungs: Bilateral basilar crackles, tachypneic, no wheeze Abdomen:  Scaphoid, NTND, +BS, no HSM Musculoskeletal:  +pedal edema Skin:    hyperpigmented areas on face and chest   LABS:  CBC  Recent Labs Lab 12/04/13 0050 12/04/13 0500 12/05/13 0100  WBC 15.2* 15.2* 11.7*  HGB 9.6* 9.5* 8.2*  HCT 29.5* 29.2* 25.4*  PLT 95* 92* 74*   Coag's  Recent Labs Lab 11/06/2013 2157  INR 1.20   BMET  Recent Labs Lab 12/04/13 0050 12/04/13 0500 12/05/13 0100  NA 144 146 152*  K 3.3* 3.1* 3.1*  CL 110 110 117*  CO2 17* 20 20  BUN 43* 40* 29*  CREATININE 1.22 1.11 1.01  GLUCOSE 226* 181* 232*   Electrolytes  Recent Labs Lab 12/04/13 0050 12/04/13 0500 12/04/13 1127 12/05/13 0020 12/05/13 0100 12/05/13 0740  CALCIUM 7.0* 6.9*  --   --  6.6*  --   MG 2.5  --  2.4 2.3  --  2.2  PHOS 3.5  --  2.4 2.6  --  2.2*   Sepsis Markers  Recent Labs Lab 11/18/2013 2157 11/02/2013 2206  LATICACIDVEN  --  2.06  PROCALCITON 1.64  --    ABG  Recent Labs Lab 11/15/2013 2158 12/03/13 0530  PHART 7.466* 7.346*  PCO2ART 24.4* 33.5*  PO2ART 130.0* 290.0*   Liver Enzymes  Recent Labs Lab 11/24/2013 2157  AST 87*  ALT 16  ALKPHOS 93  BILITOT 1.1  ALBUMIN 1.7*   Glucose  Recent Labs Lab 12/04/13 1628 12/04/13 1932 12/04/13 2352  12/05/13 0347 12/05/13 0748 12/05/13 1204  GLUCAP 82 103* 211* 193* 155* 124*    Imaging Dg Chest Port 1 View  12/04/2013   CLINICAL DATA:  Pneumonitis  EXAM: PORTABLE CHEST - 1 VIEW  COMPARISON:  12/03/2013  FINDINGS: Multifocal patchy opacities bilaterally, lower lobe predominant, unchanged. This appearance is suspicious for multifocal pneumonia, less likely interstitial edema. No pleural effusion or pneumothorax.  The heart is normal in size.  Endotracheal tube terminates 7.5 cm above the carina.  Left IJ venous catheter terminates in the mid SVC.  IMPRESSION: Multifocal patchy opacities, suspicious for multifocal pneumonia, less likely interstitial edema.  Endotracheal tube terminates 7.5 cm above the carina.   Electronically Signed   By: Charline Bills M.D.   On: 12/04/2013  08:55     ASSESSMENT / PLAN:  PULMONARY  A:  Acute Hypoxic Resp Failure Favor HCAP & ALI  Resp alkalosis - salicylate, TSH levels nml P:   Vent support, 8 cc/kg Wean PEEP/FiO2 for sats > 92% Daily WUA / SBT Trend CXR  CARDIOVASCULAR CVL: 7/31 femoral triple lumen (right) placed by ED A:  Septic shock - levophed gtt weaned off 8/3 am  P:  Stress steroids in setting of chronic prednisone administration  RENAL A:   AKI - Likely secondary to volume depletion and hypotension, resolving Hypokalemia P:   NS at 125 ml/hr, then decrease to 75 ml/hr at 8pm Trend BMP Monitor UOP Scheduled 40 KCL QD   GASTROINTESTINAL A:   Vent associated Dysphagia  ? Aspiration component Increased Gastric Residual / ABD Distention Hypertriglyceridemia  P:   Continue TFs Consider swallow evaluation post extubation Increase PPI to BID  HEMATOLOGIC A:   Thrombocytopenia - Mild - unknown etiology.  Noted since July 2015 P:  Trend CBC, monitor platelets  INFECTIOUS A:   HCAP - in immunocompromised patient (Humira and prednisone) P:   Rx HCAP pending further work up [employer] 7/31 >> ng UC 7/31 >> ng Abx:  zosyn, start date 8/1, D3/x Micafungin, start date 8/1, D3/x  ENDOCRINE A:   DM II Chronic Steroid Administration   Proptosis - TSH nml  P:   SSI Taper hydrocortisone 100 mg q8 once off pressors Lacrilube for eye protection  NEUROLOGIC A:   AMS improved P:   RASS goal: 0-1 Daily WUA D/C propofol Precedex gtt Fentanyl gtt PRN versed  TODAY'S SUMMARY: 58 y/o with FTT, psoriatic arthritis admitted with HCAP, AKI, resolved septic shock.    Canary Brim, NP-C [place] [facility] Pgr: [phone] or [phone]   I have personally obtained a history, examined the patient, evaluated laboratory and imaging results, formulated the assessment and plan and placed orders.  CRITICAL CARE: The patient is critically ill with multiple organ systems failure and  requires high complexity decision making for assessment and support, frequent evaluation and titration of therapies, application of advanced monitoring technologies and extensive interpretation of multiple databases. Critical Care Time devoted to patient care services described in this note is 40 minutes.     12/05/2013, 1:24 PM   Stephanie Acre, MD [place] [facility] Pager [phone] On Call Pager [phone]

## 2013-12-05 NOTE — ED Provider Notes (Signed)
58 y.o. Male with increasing weakness and poor nutrition evaluated and found to have sepsis.  Patient had central access placed here and on sepsis protocol.  CENTRAL LINE Performed by: Hilario Quarry Consent: The procedure was performed in an emergent situation. Required items: required blood products, implants, devices, and special equipment available Patient identity confirmed: arm band and provided demographic data Time out: Immediately prior to procedure a "time out" was called to verify the correct patient, procedure, equipment, support staff and site/side marked as required. Indications: vascular access Anesthesia: local infiltration Local anesthetic: lidocaine 1% with epinephrine Anesthetic total: 3 ml Patient sedated: no Preparation: skin prepped with 2% chlorhexidine Skin prep agent dried: skin prep agent completely dried prior to procedure Sterile barriers: all five maximum sterile barriers used - cap, mask, sterile gown, sterile gloves, and large sterile sheet Hand hygiene: hand hygiene performed prior to central venous catheter insertion  Location details: right femoral vein  Catheter type: triple lumen Catheter size: 8 Fr Pre-procedure: landmarks identified Ultrasound guidance: no Successful placement: yes Post-procedure: line sutured and dressing applied Assessment: blood return through all parts, free fluid flow, placement verified by x-Caliya Narine and no pneumothorax on x-Shavonta Gossen Patient tolerance: Patient tolerated the procedure well with no immediate complications.   I performed a history and physical examination of Roemello Speyer and discussed his management with Dr. Celene Kras.  I agree with the history, physical, assessment, and plan of care, with the following exceptions: None  I was present for the following procedures: None Time Spent in Critical Care of the patient: 60 Time spent in discussions with the patient and family: 38  Dusten Ellinwood S    Hilario Quarry,  MD 12/05/13 [phone]

## 2013-12-06 ENCOUNTER — Inpatient Hospital Stay (HOSPITAL_COMMUNITY): Payer: Medicaid Other

## 2013-12-06 DIAGNOSIS — L4050 Arthropathic psoriasis, unspecified: Secondary | ICD-10-CM

## 2013-12-06 LAB — GLUCOSE, CAPILLARY
GLUCOSE-CAPILLARY: 146 mg/dL — AB (ref 70–99)
GLUCOSE-CAPILLARY: 170 mg/dL — AB (ref 70–99)
Glucose-Capillary: 112 mg/dL — ABNORMAL HIGH (ref 70–99)
Glucose-Capillary: 113 mg/dL — ABNORMAL HIGH (ref 70–99)
Glucose-Capillary: 226 mg/dL — ABNORMAL HIGH (ref 70–99)
Glucose-Capillary: 224 mg/dL — ABNORMAL HIGH (ref 70–99)

## 2013-12-06 LAB — COMPREHENSIVE METABOLIC PANEL
ALBUMIN: 1.4 g/dL — AB (ref 3.5–5.2)
ALK PHOS: 135 U/L — AB (ref 39–117)
ALT: 12 U/L (ref 0–53)
ANION GAP: 12 (ref 5–15)
AST: 98 U/L — AB (ref 0–37)
BUN: 27 mg/dL — AB (ref 6–23)
CO2: 24 mEq/L (ref 19–32)
Calcium: 7.2 mg/dL — ABNORMAL LOW (ref 8.4–10.5)
Chloride: 121 mEq/L — ABNORMAL HIGH (ref 96–112)
Creatinine, Ser: 1.01 mg/dL (ref 0.50–1.35)
GFR calc Af Amer: >90 mL/min (ref >90)
GFR calc non Af Amer: 81 mL/min — ABNORMAL LOW (ref >90)
Glucose, Bld: 168 mg/dL — ABNORMAL HIGH (ref 70–99)
POTASSIUM: 3 meq/L — AB (ref 3.7–5.3)
SODIUM: 157 meq/L — AB (ref 137–147)
TOTAL PROTEIN: 5.3 g/dL — AB (ref 6.0–8.3)
Total Bilirubin: 0.9 mg/dL (ref 0.3–1.2)

## 2013-12-06 LAB — CBC
HEMATOCRIT: 28.1 % — AB (ref 39.0–52.0)
HEMOGLOBIN: 8.8 g/dL — AB (ref 13.0–17.0)
MCH: 27.3 pg (ref 26.0–34.0)
MCHC: 31.3 g/dL (ref 30.0–36.0)
MCV: 87.3 fL (ref 78.0–100.0)
Platelets: 62 10*3/uL — ABNORMAL LOW (ref 150–400)
RBC: 3.22 MIL/uL — ABNORMAL LOW (ref 4.22–5.81)
RDW: 19 % — ABNORMAL HIGH (ref 11.5–15.5)
WBC: 11.2 10*3/uL — ABNORMAL HIGH (ref 4.0–10.5)

## 2013-12-06 MED ORDER — SENNOSIDES 8.8 MG/5ML PO SYRP
5.0000 mL | ORAL_SOLUTION | Freq: Two times a day (BID) | ORAL | Status: DC | PRN
  Administered 2013-12-07: 5 mL
  Filled 2013-12-06 (×2): qty 5

## 2013-12-06 MED ORDER — HYDROCORTISONE NA SUCCINATE PF 100 MG IJ SOLR
50.0000 mg | Freq: Four times a day (QID) | INTRAMUSCULAR | Status: DC
  Administered 2013-12-06 – 2013-12-13 (×28): 50 mg via INTRAVENOUS
  Filled 2013-12-06 (×33): qty 1

## 2013-12-06 MED ORDER — POTASSIUM CHLORIDE 10 MEQ/50ML IV SOLN
10.0000 meq | INTRAVENOUS | Status: AC
  Administered 2013-12-06 (×4): 10 meq via INTRAVENOUS
  Filled 2013-12-06 (×4): qty 50

## 2013-12-06 MED ORDER — PANTOPRAZOLE SODIUM 40 MG IV SOLR
40.0000 mg | Freq: Two times a day (BID) | INTRAVENOUS | Status: DC
  Administered 2013-12-06 – 2013-12-08 (×5): 40 mg via INTRAVENOUS
  Filled 2013-12-06 (×6): qty 40

## 2013-12-06 MED ORDER — DEXTROSE-NACL 5-0.9 % IV SOLN
INTRAVENOUS | Status: DC
  Administered 2013-12-06: 13:00:00 via INTRAVENOUS

## 2013-12-06 MED ORDER — DEXMEDETOMIDINE HCL IN NACL 200 MCG/50ML IV SOLN
0.4000 ug/kg/h | INTRAVENOUS | Status: AC
  Administered 2013-12-06 – 2013-12-07 (×3): 0.7 ug/kg/h via INTRAVENOUS
  Administered 2013-12-07: 0.8 ug/kg/h via INTRAVENOUS
  Administered 2013-12-07: 0.9 ug/kg/h via INTRAVENOUS
  Administered 2013-12-07 (×2): 0.7 ug/kg/h via INTRAVENOUS
  Filled 2013-12-06 (×7): qty 50

## 2013-12-06 MED ORDER — SENNOSIDES 8.8 MG/5ML PO SYRP
5.0000 mL | ORAL_SOLUTION | Freq: Two times a day (BID) | ORAL | Status: DC | PRN
  Filled 2013-12-06: qty 5

## 2013-12-06 NOTE — Progress Notes (Signed)
Upon assessment at 0800, residuals noted to be >684mL.  MD made aware and advised to discard the entire contents, continue to monitor and hold all meds/free water per OGT.  Will continue to assess.

## 2013-12-06 NOTE — Progress Notes (Signed)
PULMONARY / CRITICAL CARE MEDICINE   Name: Lawrence Jennings MRN: [id number] DOB: 10-25-1955    ADMISSION DATE:  11/22/2013 CONSULTATION DATE:  12/03/13   REFERRING MD :  ED  CHIEF COMPLAINT:  AMS  INITIAL PRESENTATION: 58 y/o M with PMH of psoriatic arthritis on Humira + Pred, recent admission (7/3-7/7) for FTT & cellulitis (doxy) who presented to [facility] ER on 7/31 after being found by family with AMS & weakness.  Intubated for hypoxic respiratory failure / ALI.    PMH - mild thrombocytopenia of unknown etiology,diabetes that has been poorly controlled.   STUDIES:  8/2 - Echo EF 60%  SIGNIFICANT EVENTS: 8/01 - admitted to ICU for hypoxic respiratory failure 8/03 - levo gtt weaned off  SUBJECTIVE: RN reports hypokalemia, elevated residuals.  Levo weaned off at 10 am   VITAL SIGNS: Temp:  [97.8 F (36.6 C)-100.3 F (37.9 C)] 100.3 F (37.9 C) (08/04 1100) Pulse Rate:  [76-110] 99 (08/04 1100) Resp:  [16-31] 21 (08/04 1100) BP: (95-141)/(55-79) 129/73 mmHg (08/04 1100) SpO2:  [93 %-99 %] 95 % (08/04 1100) FiO2 (%):  [50 %-60 %] 50 % (08/04 0814) Weight:  [164 lb 7.4 oz (74.6 kg)] 164 lb 7.4 oz (74.6 kg) (08/04 0500)  HEMODYNAMICS: CVP:  [3 mmHg-4 mmHg] 3 mmHg  VENTILATOR SETTINGS: Vent Mode:  [-] CPAP;PSV FiO2 (%):  [50 %-60 %] 50 % Set Rate:  [20 bmp-24 bmp] 20 bmp Vt Set:  [600 mL] 600 mL PEEP:  [5 cmH20] 5 cmH20 Pressure Support:  [8 cmH20-10 cmH20] 8 cmH20 Plateau Pressure:  [20 cmH20-31 cmH20] 31 cmH20  INTAKE / OUTPUT:  Intake/Output Summary (Last 24 hours) at 12/06/13 1138 Last data filed at 12/06/13 1100  Gross per 24 hour  Intake 3735.57 ml  Output   1955 ml  Net 1780.57 ml    PHYSICAL EXAMINATION: General: mildly somnolent, cachectic, sedated Neuro:  Sedated, RASS -3 HEENT:  Proptosis, PERRL, EOMI, OP clear, dry mucosa Cardiovascular:  Tachycardic, regular rhythm, no MRG Lungs: Bilateral basilar crackles, tachypneic, no wheeze Abdomen:  Scaphoid, NTND,  +BS, no HSM Musculoskeletal:  +pedal edema Skin:   hyperpigmented areas on face and chest   LABS:  CBC  Recent Labs Lab 12/04/13 0500 12/05/13 0100 12/06/13 0458  WBC 15.2* 11.7* 11.2*  HGB 9.5* 8.2* 8.8*  HCT 29.2* 25.4* 28.1*  PLT 92* 74* 62*   Coag's  Recent Labs Lab 11/06/2013 2157  INR 1.20   BMET  Recent Labs Lab 12/04/13 0500 12/05/13 0100 12/06/13 0458  NA 146 152* 157*  K 3.1* 3.1* 3.0*  CL 110 117* 121*  CO2 20 20 24   BUN 40* 29* 27*  CREATININE 1.11 1.01 1.01  GLUCOSE 181* 232* 168*   Electrolytes  Recent Labs Lab 12/04/13 0500 12/04/13 1127 12/05/13 0020 12/05/13 0100 12/05/13 0740 12/06/13 0458  CALCIUM 6.9*  --   --  6.6*  --  7.2*  MG  --  2.4 2.3  --  2.2  --   PHOS  --  2.4 2.6  --  2.2*  --    Sepsis Markers  Recent Labs Lab 11/27/2013 2157 11/14/2013 2206  LATICACIDVEN  --  2.06  PROCALCITON 1.64  --    ABG  Recent Labs Lab 11/08/2013 2158 12/03/13 0530  PHART 7.466* 7.346*  PCO2ART 24.4* 33.5*  PO2ART 130.0* 290.0*   Liver Enzymes  Recent Labs Lab 11/03/2013 2157 12/06/13 0458  AST 87* 98*  ALT 16 12  ALKPHOS 93 135*  BILITOT 1.1 0.9  ALBUMIN 1.7* 1.4*   Glucose  Recent Labs Lab 12/05/13 1204 12/05/13 1552 12/05/13 1927 12/06/13 0005 12/06/13 0353 12/06/13 0757  GLUCAP 124* 100* 88 146* 170* 112*    Imaging Dg Chest Port 1 View  12/05/2013   CLINICAL DATA:  Pneumonitis.  EXAM: PORTABLE CHEST - 1 VIEW  COMPARISON:  12/04/2013.  FINDINGS: Tracheostomy tube, left IJ line, NG tube in stable position. Heart size and pulmonary vascularity normal. Bilateral pulmonary infiltrates are present. No pleural effusion or pneumothorax. No acute osseous abnormality .  IMPRESSION: 1. Line and tube position stable. 2. Persistent unchanged bilateral pulmonary alveolar infiltrates.   Electronically Signed   By: Maisie Fus  Register   On: 12/05/2013 07:15     ASSESSMENT / PLAN:  PULMONARY  A:  Acute Hypoxic Resp  Failure Favor HCAP & ALI  Resp alkalosis - salicylate, TSH levels nml ? Cavitation on CXR P:   Vent support, 8 cc/kg Wean PEEP/FiO2 for sats > 92% Daily WUA / SBT Trend CXR CT Chest today Cont with Zosyn and Micafungin (immunosuppressed on Humiria).   CARDIOVASCULAR CVL: 7/31 femoral triple lumen (right) placed by ED A:  Septic shock - levophed gtt weaned off 8/3 am  P:  Stress steroids in setting of chronic prednisone administration, weaning today to 50 q6 hrs (previouly 100 q8hrs)  RENAL A:   AKI - Likely secondary to volume depletion and hypotension, resolving Hypernatremia\Dehydration P:   D5 NS @ 125cc\hr and re-eval in the am (Free H2O Deficit at 4.5L) Trend BMP Monitor UOP Scheduled 40 KCL QD   GASTROINTESTINAL A:   Vent associated Dysphagia  ? Aspiration component Increased Gastric Residual / ABD Distention Hypertriglyceridemia  P:   Hold Continue TFs - due to large residuals. Start Reglan Consider swallow evaluation post extubation Increase PPI to BID  HEMATOLOGIC A:   Thrombocytopenia - Mild - unknown etiology.  Noted since July 2015 P:  Trend CBC, monitor platelets  INFECTIOUS A:   HCAP - in immunocompromised patient (Humira and prednisone) P:   Rx HCAP pending further work up [employer] 7/31 >> ng UC 7/31 >> ng Abx:  zosyn, start date 8/1, D3/x Micafungin, start date 8/1, D3/x  ENDOCRINE A:   DM II Chronic Steroid Administration   Proptosis - TSH nml  P:   SSI Taper hydrocortisone 100 mg q8 once off pressors Lacrilube for eye protection  NEUROLOGIC A:   AMS improved P:   RASS goal: 0-1 Daily WUA D/C propofol Precedex gtt Fentanyl gtt PRN versed  TODAY'S SUMMARY: 58 y/o with FTT, psoriatic arthritis admitted with HCAP, AKI, resolved septic shock, possible RLL\RML cavitation on CXR, CT chest pending, correcting Hyponatremia\dehydration.Attempted SBT, but became tachypneic and agitated with desats.    I have personally obtained a  history, examined the patient, evaluated laboratory and imaging results, formulated the assessment and plan and placed orders.  CRITICAL CARE: The patient is critically ill with multiple organ systems failure and requires high complexity decision making for assessment and support, frequent evaluation and titration of therapies, application of advanced monitoring technologies and extensive interpretation of multiple databases. Critical Care Time devoted to patient care services described in this note is 35 minutes.    12/06/2013, 11:38 AM   Stephanie Acre, MD [place] [facility] Pager [phone] On Call Pager [phone]

## 2013-12-07 ENCOUNTER — Inpatient Hospital Stay (HOSPITAL_COMMUNITY): Payer: Medicaid Other

## 2013-12-07 ENCOUNTER — Encounter (HOSPITAL_COMMUNITY): Payer: Self-pay | Admitting: Radiology

## 2013-12-07 DIAGNOSIS — E870 Hyperosmolality and hypernatremia: Secondary | ICD-10-CM

## 2013-12-07 DIAGNOSIS — N179 Acute kidney failure, unspecified: Secondary | ICD-10-CM

## 2013-12-07 LAB — GLUCOSE, CAPILLARY
GLUCOSE-CAPILLARY: 128 mg/dL — AB (ref 70–99)
GLUCOSE-CAPILLARY: 127 mg/dL — AB (ref 70–99)
GLUCOSE-CAPILLARY: 218 mg/dL — AB (ref 70–99)
GLUCOSE-CAPILLARY: 260 mg/dL — AB (ref 70–99)
Glucose-Capillary: 114 mg/dL — ABNORMAL HIGH (ref 70–99)
Glucose-Capillary: 179 mg/dL — ABNORMAL HIGH (ref 70–99)

## 2013-12-07 LAB — CBC WITH DIFFERENTIAL/PLATELET
Basophils Absolute: 0 10*3/uL (ref 0.0–0.1)
Basophils Relative: 0 % (ref 0–1)
Eosinophils Absolute: 0 10*3/uL (ref 0.0–0.7)
Eosinophils Relative: 0 % (ref 0–5)
HCT: 27.5 % — ABNORMAL LOW (ref 39.0–52.0)
HEMOGLOBIN: 8.7 g/dL — AB (ref 13.0–17.0)
LYMPHS PCT: 3 % — AB (ref 12–46)
Lymphs Abs: 0.4 10*3/uL — ABNORMAL LOW (ref 0.7–4.0)
MCH: 27.7 pg (ref 26.0–34.0)
MCHC: 31.6 g/dL (ref 30.0–36.0)
MCV: 87.6 fL (ref 78.0–100.0)
Monocytes Absolute: 0.2 10*3/uL (ref 0.1–1.0)
Monocytes Relative: 2 % — ABNORMAL LOW (ref 3–12)
NEUTROS ABS: 11.1 10*3/uL — AB (ref 1.7–7.7)
Neutrophils Relative %: 94 % — ABNORMAL HIGH (ref 43–77)
Platelets: 45 10*3/uL — ABNORMAL LOW (ref 150–400)
RBC: 3.14 MIL/uL — ABNORMAL LOW (ref 4.22–5.81)
RDW: 19.4 % — ABNORMAL HIGH (ref 11.5–15.5)
WBC: 11.7 10*3/uL — ABNORMAL HIGH (ref 4.0–10.5)

## 2013-12-07 LAB — BASIC METABOLIC PANEL
ANION GAP: 14 (ref 5–15)
ANION GAP: 10 (ref 5–15)
BUN: 25 mg/dL — ABNORMAL HIGH (ref 6–23)
BUN: 24 mg/dL — ABNORMAL HIGH (ref 6–23)
CHLORIDE: 128 meq/L — AB (ref 96–112)
CO2: 24 meq/L (ref 19–32)
CO2: 25 meq/L (ref 19–32)
CREATININE: 0.99 mg/dL (ref 0.50–1.35)
CREATININE: 0.94 mg/dL (ref 0.50–1.35)
Calcium: 7 mg/dL — ABNORMAL LOW (ref 8.4–10.5)
Calcium: 6.9 mg/dL — ABNORMAL LOW (ref 8.4–10.5)
Chloride: 126 mEq/L — ABNORMAL HIGH (ref 96–112)
GFR calc Af Amer: >90 mL/min (ref >90)
GFR calc non Af Amer: 90 mL/min — ABNORMAL LOW (ref >90)
GLUCOSE: 126 mg/dL — AB (ref 70–99)
Glucose, Bld: 181 mg/dL — ABNORMAL HIGH (ref 70–99)
Potassium: 2.4 mEq/L — CL (ref 3.7–5.3)
Potassium: 3.2 mEq/L — ABNORMAL LOW (ref 3.7–5.3)
SODIUM: 166 meq/L — AB (ref 137–147)
SODIUM: 161 meq/L — AB (ref 137–147)

## 2013-12-07 LAB — PHOSPHORUS: PHOSPHORUS: 2.3 mg/dL (ref 2.3–4.6)

## 2013-12-07 LAB — MAGNESIUM: Magnesium: 1.9 mg/dL (ref 1.5–2.5)

## 2013-12-07 MED ORDER — POTASSIUM CHLORIDE 10 MEQ/50ML IV SOLN
10.0000 meq | INTRAVENOUS | Status: AC
  Administered 2013-12-07 (×4): 10 meq via INTRAVENOUS
  Filled 2013-12-07 (×4): qty 50

## 2013-12-07 MED ORDER — SODIUM CHLORIDE 0.9 % IV SOLN
INTRAVENOUS | Status: DC
  Administered 2013-12-07: 1.8 [IU]/h via INTRAVENOUS
  Filled 2013-12-07: qty 1

## 2013-12-07 MED ORDER — POTASSIUM CHLORIDE 10 MEQ/50ML IV SOLN
10.0000 meq | INTRAVENOUS | Status: AC
  Administered 2013-12-07 (×3): 10 meq via INTRAVENOUS
  Filled 2013-12-07 (×3): qty 50

## 2013-12-07 MED ORDER — DEXTROSE 5 % IV SOLN
INTRAVENOUS | Status: DC
  Administered 2013-12-07 – 2013-12-08 (×2): via INTRAVENOUS
  Administered 2013-12-09: 1000 mL via INTRAVENOUS
  Administered 2013-12-10 – 2013-12-11 (×2): via INTRAVENOUS
  Administered 2013-12-12: 1000 mL via INTRAVENOUS
  Administered 2013-12-13: 02:00:00 via INTRAVENOUS

## 2013-12-07 MED ORDER — SULFAMETHOXAZOLE-TRIMETHOPRIM 400-80 MG/5ML IV SOLN
360.0000 mg | Freq: Three times a day (TID) | INTRAVENOUS | Status: DC
  Administered 2013-12-07 – 2013-12-08 (×3): 360 mg via INTRAVENOUS
  Filled 2013-12-07 (×6): qty 22.5

## 2013-12-07 MED ORDER — VANCOMYCIN HCL IN DEXTROSE 1-5 GM/200ML-% IV SOLN
1000.0000 mg | Freq: Two times a day (BID) | INTRAVENOUS | Status: DC
  Administered 2013-12-07 – 2013-12-11 (×8): 1000 mg via INTRAVENOUS
  Filled 2013-12-07 (×9): qty 200

## 2013-12-07 MED ORDER — DEXMEDETOMIDINE HCL IN NACL 400 MCG/100ML IV SOLN
0.4000 ug/kg/h | INTRAVENOUS | Status: AC
  Administered 2013-12-07: 1.2 ug/kg/h via INTRAVENOUS
  Administered 2013-12-07: 0.9 ug/kg/h via INTRAVENOUS
  Administered 2013-12-07 – 2013-12-08 (×3): 1.2 ug/kg/h via INTRAVENOUS
  Filled 2013-12-07: qty 100
  Filled 2013-12-07: qty 50
  Filled 2013-12-07 (×2): qty 100

## 2013-12-07 MED ORDER — IOHEXOL 300 MG/ML  SOLN: 80.0000 mL | Freq: Once | INTRAMUSCULAR | Status: AC | PRN

## 2013-12-07 MED ORDER — POTASSIUM CHLORIDE 20 MEQ/15ML (10%) PO LIQD
40.0000 meq | ORAL | Status: DC
  Administered 2013-12-07: 40 meq

## 2013-12-07 MED ORDER — POTASSIUM CHLORIDE 20 MEQ/15ML (10%) PO LIQD
40.0000 meq | ORAL | Status: DC
  Filled 2013-12-07 (×2): qty 30

## 2013-12-07 NOTE — Progress Notes (Signed)
CRITICAL VALUE ALERT  Critical value received:  Na 166, k 2.4  Date of notification:  12/07/13  Time of notification:  0515  Critical value read back: yes  Nurse who received alert:  km  MD notified (1st page):  elink  Time MD responded:  [phone]

## 2013-12-07 NOTE — Progress Notes (Signed)
PULMONARY / CRITICAL CARE MEDICINE   Name: Lawrence Jennings MRN: [id number] DOB: [birth year]    ADMISSION DATE:  2013/12/10 CONSULTATION DATE:  12/03/13   REFERRING MD :  ED  CHIEF COMPLAINT:  AMS  INITIAL PRESENTATION: 58 y/o M with PMH of psoriatic arthritis on Humira + Pred, recent admission (7/3-7/7) for FTT & cellulitis (doxy) who presented to [facility] ER on 7/31 after being found by family with AMS & weakness.  Intubated for hypoxic respiratory failure / ALI.    PMH - mild thrombocytopenia of unknown etiology,diabetes that has been poorly controlled.   STUDIES:  8/2 - Echo EF 60%  SIGNIFICANT EVENTS: 8/01 - admitted to ICU for hypoxic respiratory failure 8/03 - levo gtt weaned off 8/04 - CT Chest with Pneumomediastinum and RLL cavitary lesion  SUBJECTIVE: RN reports hypokalemia, elevated residuals and agitation, CT results as stated below.   VITAL SIGNS: Temp:  [97.4 F (36.3 C)-100.9 F (38.3 C)] 97.8 F (36.6 C) (08/05 1000) Pulse Rate:  [64-119] 64 (08/05 1000) Resp:  [17-25] 21 (08/05 1000) BP: (117-159)/(66-79) 135/74 mmHg (08/05 1000) SpO2:  [87 %-100 %] 100 % (08/05 1000) FiO2 (%):  [50 %] 50 % (08/05 0820) Weight:  [161 lb 6 oz (73.2 kg)] 161 lb 6 oz (73.2 kg) (08/05 0420)  HEMODYNAMICS: CVP:  [0 mmHg-3 mmHg] 3 mmHg  VENTILATOR SETTINGS: Vent Mode:  [-] PRVC FiO2 (%):  [50 %] 50 % Set Rate:  [2 bmp-20 bmp] 20 bmp Vt Set:  [600 mL] 600 mL PEEP:  [5 cmH20] 5 cmH20 Plateau Pressure:  [16 cmH20-17 cmH20] 16 cmH20  INTAKE / OUTPUT:  Intake/Output Summary (Last 24 hours) at 12/07/13 1208 Last data filed at 12/07/13 1000  Gross per 24 hour  Intake 4463.12 ml  Output   3035 ml  Net 1428.12 ml    PHYSICAL EXAMINATION: General: mildly somnolent, cachectic, sedated Neuro:  Sedated, RASS -3 HEENT:  Proptosis, PERRL, EOMI, OP clear, dry mucosa Cardiovascular:  Tachycardic, regular rhythm, no MRG Lungs: Bilateral basilar crackles, tachypneic, no wheeze Abdomen:   Scaphoid, NTND, +BS, no HSM Musculoskeletal:  +pedal edema Skin:   hyperpigmented areas on face and chest   LABS:  CBC  Recent Labs Lab 12/05/13 0100 12/06/13 0458 12/07/13 0420  WBC 11.7* 11.2* 11.7*  HGB 8.2* 8.8* 8.7*  HCT 25.4* 28.1* 27.5*  PLT 74* 62* 45*   Coag's  Recent Labs Lab 2013-12-10 2157  INR 1.20   BMET  Recent Labs Lab 12/05/13 0100 12/06/13 0458 12/07/13 0420  NA 152* 157* 166*  K 3.1* 3.0* 2.4*  CL 117* 121* 128*  CO2 20 24 24   BUN 29* 27* 25*  CREATININE 1.01 1.01 0.99  GLUCOSE 232* 168* 126*   Electrolytes  Recent Labs Lab 12/04/13 1127 12/05/13 0020 12/05/13 0100 12/05/13 0740 12/06/13 0458 12/07/13 0420  CALCIUM  --   --  6.6*  --  7.2* 7.0*  MG 2.4 2.3  --  2.2  --   --   PHOS 2.4 2.6  --  2.2*  --   --    Sepsis Markers  Recent Labs Lab 12-10-2013 2157 12-10-2013 2206  LATICACIDVEN  --  2.06  PROCALCITON 1.64  --    ABG  Recent Labs Lab 12-10-2013 2158 12/03/13 0530  PHART 7.466* 7.346*  PCO2ART 24.4* 33.5*  PO2ART 130.0* 290.0*   Liver Enzymes  Recent Labs Lab 2013/12/10 2157 12/06/13 0458  AST 87* 98*  ALT 16 12  ALKPHOS 93 135*  BILITOT 1.1 0.9  ALBUMIN 1.7* 1.4*   Glucose  Recent Labs Lab 12/06/13 1137 12/06/13 1648 12/06/13 1957 12/06/13 2349 12/07/13 0346 12/07/13 0758  GLUCAP 113* 226* 224* 179* 128* 127*    Imaging Dg Chest Port 1 View  12/06/2013   CLINICAL DATA:  Intubation.  EXAM: PORTABLE CHEST - 1 VIEW  COMPARISON:  12/05/2013.  11/23/2013.  FINDINGS: Endotracheal tube, left IJ line, NG tube in stable position. Heart size stable. Pulmonary vascularity normal. Diffuse severe unchanged pulmonary infiltrates are noted bilaterally. Developing cavity in the right mid lung cannot be excluded. No pleural effusion or pneumothorax. No acute osseous abnormality.  IMPRESSION: 1. Line to position stable. 2. Persistent diffuse bilateral unchanged pulmonary infiltrates. 3. Developing cavity in the right  mid lung cannot be excluded. If this persist on subsequent chest x-rays chest CT can be obtained to further evaluate.   Electronically Signed   By: Maisie Fus  Register   On: 12/06/2013 07:35     Antibiotics Zosyn 8/1>> Vanc 8/1>>8/3, restarted on 8/5 Bactrim 8/5 Micafungin 8/1>>   ASSESSMENT / PLAN:  PULMONARY  A:  Acute Hypoxic Resp Failure Favor HCAP & ALI  Resp alkalosis - salicylate, TSH levels nml Cavitation on CT with pneumomediastium P:   Vent support, 8 cc/kg Wean PEEP/FiO2 for sats > 92% Keep plateau pressure <30cm, inc FiO2 to 50, keep sedated for the rest of the day Trend CXR Cont with Zosyn\Vanc and Micafungin (immunosuppressed on Humiria). Will also start bactrim for possible PJP, given GGO and cavitation on Chest CT. Check sputum culture for PJP  CARDIOVASCULAR CVL: 7/31 femoral triple lumen (right) placed by ED A:  Septic shock - levophed gtt weaned off 8/3 am  P:  Stress steroids in setting of chronic prednisone administration, started weaning on 8/4 to 50 q6 hrs (previouly 100 q8hrs)  RENAL A:   AKI - Likely secondary to volume depletion and hypotension, resolving Hypernatremia\Dehydration P:   D5W @ 125cc\hr and re-eval in the pm (Free H2O Deficit at 4.5L) Trend BMP Monitor UOP Scheduled 40 KCL QD   GASTROINTESTINAL A:   Vent associated Dysphagia  ? Aspiration component Increased Gastric Residual / ABD Distention Hypertriglyceridemia  P:   Hold Continue TFs - due to large residuals. Start Reglan Consider swallow evaluation post extubation Increase PPI to BID  HEMATOLOGIC A:   Thrombocytopenia - Mild - unknown etiology.  Noted since July 2015 P:  Trend CBC, monitor platelets  INFECTIOUS A:   HCAP - in immunocompromised patient (Humira and prednisone) P:   Rx HCAP pending further work up [employer] 7/31 >> ng UC 7/31 >> ng Abx:  zosyn, start date 8/1, D3/x Micafungin, start date 8/1, D3/x  ENDOCRINE A:   DM II Chronic Steroid  Administration   Proptosis - TSH nml  P:   SSI Taper hydrocortisone 100 mg q8 once off pressors Lacrilube for eye protection  NEUROLOGIC A:   AMS improved P:   RASS goal: 0-1 Daily WUA D/C propofol Precedex gtt Fentanyl gtt PRN versed  TODAY'S SUMMARY: 58 y/o with FTT, psoriatic arthritis admitted with HCAP, AKI, resolved septic shock, possible RLL\RML cavitation with pneumomediastium on CT chest pending, correcting Hyponatremia\dehydration, treating HCAP and possible PCP (on Humira). Plan to sedate today, inc FiO2, keep plateau pressures on MV <30cm for pneumomediastium    I have personally obtained a history, examined the patient, evaluated laboratory and imaging results, formulated the assessment and plan and placed orders.  CRITICAL CARE: The patient is critically ill with  multiple organ systems failure and requires high complexity decision making for assessment and support, frequent evaluation and titration of therapies, application of advanced monitoring technologies and extensive interpretation of multiple databases. Critical Care Time devoted to patient care services described in this note is 35 minutes.    12/07/2013, 12:08 PM   Stephanie Acre, MD [place] [facility] Pager [phone] On Call Pager [phone]

## 2013-12-07 NOTE — Progress Notes (Signed)
ANTIBIOTIC CONSULT NOTE - INITIAL (vanc/bactrim)- F/U (zosyn)  Pharmacy Consult for Vanc/zosyn/bactrim Indication: pneumonia, r/o pcp  No Known Allergies  Patient Measurements: Height: 5' 11.65" (182 cm) Weight: 161 lb 6 oz (73.2 kg) IBW/kg (Calculated) : 76.8  Vital Signs: Temp: 98.3 F (36.8 C) (08/05 1200) Temp src: Core (Comment) (08/05 0800) BP: 135/80 mmHg (08/05 1230) Pulse Rate: 71 (08/05 1200)  Labs:  Recent Labs  12/05/13 0100 12/06/13 0458 12/07/13 0420  WBC 11.7* 11.2* 11.7*  HGB 8.2* 8.8* 8.7*  PLT 74* 62* 45*  CREATININE 1.01 1.01 0.99   Estimated Creatinine Clearance: 86.3 ml/min (by C-G formula based on Cr of 0.99).    Assessment: 58yo male on abx D#5 to add vancomycin and bactrim for likely cavitary pna and r/o PCP (immunocompromised on humira + steroids). Also continuing on zosyn. Afebrile, WBC slightly increased 11.7. SCr 0.99 (baseline~0.7). CrCl~86, UOP good.  8/5 bactrim >> 7/31 vanc>>8/2, 8/5>> 8/1 zosyn>> 8/1 micafungin>> (hx humira + steroids)  7/31 bld x2>ngtd 8/1 urine>ngf 8/1 rapid strep - neg 8/1 Throat>>B hemolytic strep, not grp A  Goal of Therapy:  Vancomycin trough level 15-20 mcg/ml  Plan:  Continue zosyn 3.375g IV q8h 4h infusion Add back vanc 1g IV q12h (CT chest-likely cavitary pna) F/u VT @SS  Initiate bactrim 360 mg (5mg /kg) IV q8h r/o PCP F/u cx data, clinical progress, renal function, duration of abx  , PharmD Clinical Pharmacist - Resident Pager [phone] 12/07/2013 2:05 PM

## 2013-12-07 NOTE — Progress Notes (Signed)
eLink Physician-Brief Progress Note Patient Name: Lawrence Jennings DOB: 07-14-1955 MRN: [id number]  Date of Service  12/07/2013   HPI/Events of Note     eICU Interventions  Hypokalemia -repleted    Intervention Category Minor Interventions: Electrolytes abnormality - evaluation and management  [name] V. 12/07/2013, 6:57 PM

## 2013-12-07 NOTE — Progress Notes (Signed)
Transported patient to C.T while on the vent. Patient remained stable during transport.  

## 2013-12-07 NOTE — Progress Notes (Signed)
Contacted by radiology, pneumomediastinum on chest CT.  CT reviewed.  Patient stable, will notify rounding MD.  Alyson Reedy, M.D. [facility] Pulmonary/Critical Care Medicine. Pager: [phone]. After hours pager: [phone].

## 2013-12-07 NOTE — Progress Notes (Signed)
[facility] ADULT ICU REPLACEMENT PROTOCOL FOR AM LAB REPLACEMENT ONLY  The patient does apply for the [facility] Adult ICU Electrolyte Replacment Protocol based on the criteria listed below:   1. Is GFR >/= 40 ml/min? Yes.    Patient's GFR today is 90 2. Is urine output >/= 0.5 ml/kg/hr for the last 6 hours? Yes.   Patient's UOP is 1.91 ml/kg/hr 3. Is BUN < 60 mg/dL? Yes.    Patient's BUN today is 25 4. Abnormal electrolyte(s): Potassium 5. Ordered repletion with: Potassium per protocol 6. If a panic level lab has been reported, has the CCM MD in charge been notified? Yes.  .   Physician:  Duard Larsen, Rudolpho Claxton P 12/07/2013 5:20 AM

## 2013-12-08 ENCOUNTER — Inpatient Hospital Stay (HOSPITAL_COMMUNITY): Payer: Medicaid Other

## 2013-12-08 LAB — GLUCOSE, CAPILLARY
GLUCOSE-CAPILLARY: 199 mg/dL — AB (ref 70–99)
GLUCOSE-CAPILLARY: 162 mg/dL — AB (ref 70–99)
GLUCOSE-CAPILLARY: 169 mg/dL — AB (ref 70–99)
GLUCOSE-CAPILLARY: 206 mg/dL — AB (ref 70–99)
GLUCOSE-CAPILLARY: 162 mg/dL — AB (ref 70–99)
Glucose-Capillary: 211 mg/dL — ABNORMAL HIGH (ref 70–99)
Glucose-Capillary: 113 mg/dL — ABNORMAL HIGH (ref 70–99)
Glucose-Capillary: 118 mg/dL — ABNORMAL HIGH (ref 70–99)
Glucose-Capillary: 122 mg/dL — ABNORMAL HIGH (ref 70–99)
Glucose-Capillary: 134 mg/dL — ABNORMAL HIGH (ref 70–99)
Glucose-Capillary: 153 mg/dL — ABNORMAL HIGH (ref 70–99)
Glucose-Capillary: 156 mg/dL — ABNORMAL HIGH (ref 70–99)
Glucose-Capillary: 170 mg/dL — ABNORMAL HIGH (ref 70–99)
Glucose-Capillary: 236 mg/dL — ABNORMAL HIGH (ref 70–99)
Glucose-Capillary: 79 mg/dL (ref 70–99)

## 2013-12-08 LAB — BASIC METABOLIC PANEL
ANION GAP: 13 (ref 5–15)
Anion gap: 11 (ref 5–15)
BUN: 18 mg/dL (ref 6–23)
BUN: 22 mg/dL (ref 6–23)
CHLORIDE: 113 meq/L — AB (ref 96–112)
CHLORIDE: 120 meq/L — AB (ref 96–112)
CO2: 24 mEq/L (ref 19–32)
CO2: 25 mEq/L (ref 19–32)
CREATININE: 0.87 mg/dL (ref 0.50–1.35)
CREATININE: 0.93 mg/dL (ref 0.50–1.35)
Calcium: 6.8 mg/dL — ABNORMAL LOW (ref 8.4–10.5)
Calcium: 6.8 mg/dL — ABNORMAL LOW (ref 8.4–10.5)
GFR calc Af Amer: >90 mL/min (ref >90)
GFR calc non Af Amer: >90 mL/min (ref >90)
GFR calc non Af Amer: >90 mL/min (ref >90)
Glucose, Bld: 153 mg/dL — ABNORMAL HIGH (ref 70–99)
Glucose, Bld: 185 mg/dL — ABNORMAL HIGH (ref 70–99)
Potassium: 3.2 mEq/L — ABNORMAL LOW (ref 3.7–5.3)
Potassium: 3.4 mEq/L — ABNORMAL LOW (ref 3.7–5.3)
SODIUM: 150 meq/L — AB (ref 137–147)
Sodium: 156 mEq/L — ABNORMAL HIGH (ref 137–147)

## 2013-12-08 LAB — POCT I-STAT 3, ART BLOOD GAS (G3+)
Acid-Base Excess: 2 mmol/L (ref 0.0–2.0)
Bicarbonate: 25.7 mEq/L — ABNORMAL HIGH (ref 20.0–24.0)
O2 Saturation: 100 %
Patient temperature: 99.4
TCO2: 27 mmol/L (ref 0–100)
pCO2 arterial: 35.9 mmHg (ref 35.0–45.0)
pH, Arterial: 7.464 — ABNORMAL HIGH (ref 7.350–7.450)
pO2, Arterial: 159 mmHg — ABNORMAL HIGH (ref 80.0–100.0)

## 2013-12-08 LAB — OCCULT BLOOD GASTRIC / DUODENUM (SPECIMEN CUP): Occult Blood, Gastric: POSITIVE — AB

## 2013-12-08 LAB — COMPREHENSIVE METABOLIC PANEL
ALT: 12 U/L (ref 0–53)
AST: 92 U/L — AB (ref 0–37)
Albumin: 1.1 g/dL — ABNORMAL LOW (ref 3.5–5.2)
Alkaline Phosphatase: 154 U/L — ABNORMAL HIGH (ref 39–117)
Anion gap: 11 (ref 5–15)
BUN: 19 mg/dL (ref 6–23)
CO2: 25 mEq/L (ref 19–32)
CREATININE: 0.9 mg/dL (ref 0.50–1.35)
Calcium: 6.7 mg/dL — ABNORMAL LOW (ref 8.4–10.5)
Chloride: 115 mEq/L — ABNORMAL HIGH (ref 96–112)
GFR calc Af Amer: >90 mL/min (ref >90)
GFR calc non Af Amer: >90 mL/min (ref >90)
Glucose, Bld: 190 mg/dL — ABNORMAL HIGH (ref 70–99)
POTASSIUM: 3.4 meq/L — AB (ref 3.7–5.3)
Sodium: 151 mEq/L — ABNORMAL HIGH (ref 137–147)
TOTAL PROTEIN: 5 g/dL — AB (ref 6.0–8.3)
Total Bilirubin: 0.5 mg/dL (ref 0.3–1.2)

## 2013-12-08 LAB — CBC WITH DIFFERENTIAL/PLATELET
BASOS ABS: 0 10*3/uL (ref 0.0–0.1)
Basophils Relative: 0 % (ref 0–1)
EOS ABS: 0 10*3/uL (ref 0.0–0.7)
Eosinophils Relative: 0 % (ref 0–5)
HCT: 25.5 % — ABNORMAL LOW (ref 39.0–52.0)
HEMOGLOBIN: 8.2 g/dL — AB (ref 13.0–17.0)
Lymphocytes Relative: 3 % — ABNORMAL LOW (ref 12–46)
Lymphs Abs: 0.3 10*3/uL — ABNORMAL LOW (ref 0.7–4.0)
MCH: 27.7 pg (ref 26.0–34.0)
MCHC: 32.2 g/dL (ref 30.0–36.0)
MCV: 86.1 fL (ref 78.0–100.0)
MONOS PCT: 2 % — AB (ref 3–12)
Monocytes Absolute: 0.2 10*3/uL (ref 0.1–1.0)
Neutro Abs: 9.9 10*3/uL — ABNORMAL HIGH (ref 1.7–7.7)
Neutrophils Relative %: 95 % — ABNORMAL HIGH (ref 43–77)
Platelets: 42 10*3/uL — ABNORMAL LOW (ref 150–400)
RBC: 2.96 MIL/uL — ABNORMAL LOW (ref 4.22–5.81)
RDW: 19.2 % — ABNORMAL HIGH (ref 11.5–15.5)
WBC: 10.4 10*3/uL (ref 4.0–10.5)

## 2013-12-08 LAB — PNEUMOCYSTIS JIROVECI SMEAR BY DFA: PNEUMOCYSTIS JIROVECI AG: NEGATIVE

## 2013-12-08 MED ORDER — INSULIN GLARGINE 100 UNIT/ML ~~LOC~~ SOLN
10.0000 [IU] | SUBCUTANEOUS | Status: DC
Start: 2013-12-08 — End: 2013-12-08
  Administered 2013-12-08: 10 [IU] via SUBCUTANEOUS
  Filled 2013-12-08 (×2): qty 0.1

## 2013-12-08 MED ORDER — ALPRAZOLAM 0.5 MG PO TABS: 0.5000 mg | ORAL_TABLET | Freq: Three times a day (TID) | ORAL | Status: DC | PRN

## 2013-12-08 MED ORDER — VITAL AF 1.2 CAL PO LIQD
1000.0000 mL | ORAL | Status: DC
  Filled 2013-12-08 (×3): qty 1000

## 2013-12-08 MED ORDER — MIDAZOLAM BOLUS VIA INFUSION
1.0000 mg | INTRAVENOUS | Status: DC | PRN
  Filled 2013-12-08: qty 2

## 2013-12-08 MED ORDER — DEXTROSE 10 % IV SOLN: INTRAVENOUS | Status: DC | PRN

## 2013-12-08 MED ORDER — POTASSIUM CHLORIDE 10 MEQ/50ML IV SOLN
10.0000 meq | INTRAVENOUS | Status: AC
  Administered 2013-12-08 (×2): 10 meq via INTRAVENOUS
  Filled 2013-12-08 (×2): qty 50

## 2013-12-08 MED ORDER — INSULIN ASPART 100 UNIT/ML ~~LOC~~ SOLN
2.0000 [IU] | SUBCUTANEOUS | Status: DC
  Administered 2013-12-08: 4 [IU] via SUBCUTANEOUS
  Administered 2013-12-09 – 2013-12-10 (×5): 2 [IU] via SUBCUTANEOUS
  Administered 2013-12-11 (×2): 4 [IU] via SUBCUTANEOUS
  Administered 2013-12-11: 2 [IU] via SUBCUTANEOUS
  Administered 2013-12-11: 4 [IU] via SUBCUTANEOUS
  Administered 2013-12-12: 2 [IU] via SUBCUTANEOUS
  Administered 2013-12-12: 4 [IU] via SUBCUTANEOUS
  Administered 2013-12-12 – 2013-12-13 (×5): 2 [IU] via SUBCUTANEOUS
  Administered 2013-12-13: 4 [IU] via SUBCUTANEOUS
  Administered 2013-12-13 (×2): 2 [IU] via SUBCUTANEOUS

## 2013-12-08 MED ORDER — POTASSIUM CHLORIDE 20 MEQ/15ML (10%) PO LIQD
40.0000 meq | Freq: Once | ORAL | Status: DC
Start: 2013-12-08 — End: 2013-12-08

## 2013-12-08 MED ORDER — INSULIN ASPART 100 UNIT/ML ~~LOC~~ SOLN: 2.0000 [IU] | Freq: Four times a day (QID) | SUBCUTANEOUS | Status: DC

## 2013-12-08 MED ORDER — ZOLPIDEM TARTRATE 5 MG PO TABS: 5.0000 mg | ORAL_TABLET | Freq: Every evening | ORAL | Status: DC | PRN

## 2013-12-08 MED ORDER — POTASSIUM CHLORIDE 10 MEQ/50ML IV SOLN
10.0000 meq | INTRAVENOUS | Status: AC
  Administered 2013-12-08 (×4): 10 meq via INTRAVENOUS
  Filled 2013-12-08 (×4): qty 50

## 2013-12-08 MED ORDER — SODIUM CHLORIDE 0.9 % IV SOLN
0.0000 mg/h | INTRAVENOUS | Status: DC
  Administered 2013-12-08: 2 mg/h via INTRAVENOUS
  Administered 2013-12-08: 3 mg/h via INTRAVENOUS
  Administered 2013-12-09: 1 mg/h via INTRAVENOUS
  Administered 2013-12-09: 8 mg/h via INTRAVENOUS
  Administered 2013-12-10: 4 mg/h via INTRAVENOUS
  Administered 2013-12-10: 2 mg/h via INTRAVENOUS
  Administered 2013-12-12 – 2013-12-13 (×2): 1 mg/h via INTRAVENOUS
  Administered 2013-12-13: 2 mg/h via INTRAVENOUS
  Administered 2013-12-14: 3 mg/h via INTRAVENOUS
  Filled 2013-12-08 (×9): qty 10

## 2013-12-08 MED ORDER — MIDAZOLAM HCL 2 MG/2ML IJ SOLN
1.0000 mg | INTRAMUSCULAR | Status: DC | PRN
  Administered 2013-12-08 – 2013-12-09 (×4): 2 mg via INTRAVENOUS

## 2013-12-08 MED ORDER — INSULIN ASPART 100 UNIT/ML ~~LOC~~ SOLN
2.0000 [IU] | SUBCUTANEOUS | Status: DC
  Administered 2013-12-08 (×2): 6 [IU] via SUBCUTANEOUS

## 2013-12-08 MED ORDER — SODIUM CHLORIDE 0.9 % IV SOLN: 1.0000 mg/h | INTRAVENOUS | Status: DC

## 2013-12-08 MED ORDER — METOCLOPRAMIDE HCL 5 MG/5ML PO SOLN
10.0000 mg | Freq: Three times a day (TID) | ORAL | Status: DC
  Filled 2013-12-08 (×3): qty 10

## 2013-12-08 MED ORDER — POTASSIUM CHLORIDE 10 MEQ/50ML IV SOLN
10.0000 meq | INTRAVENOUS | Status: AC
  Administered 2013-12-08 (×4): 10 meq via INTRAVENOUS
  Filled 2013-12-08 (×5): qty 50

## 2013-12-08 MED ORDER — FAMOTIDINE IN NACL 20-0.9 MG/50ML-% IV SOLN
20.0000 mg | Freq: Two times a day (BID) | INTRAVENOUS | Status: DC
  Administered 2013-12-08 – 2013-12-13 (×10): 20 mg via INTRAVENOUS
  Filled 2013-12-08 (×12): qty 50

## 2013-12-08 NOTE — Progress Notes (Signed)
Unable to perform Q1 hour neuro assessments due to extreme pt agitation and desaturation into the 70s when awake. Order discontinued per MD.

## 2013-12-08 NOTE — Progress Notes (Signed)
Called E-link and informed nurse about positive gastric occult blood result.  Informed her that 250 mL of dark, coffee ground secretions have been suctioned from OG tube over shift.  Asked if MD wanted a CBC drawn Leitha Schuller, RN

## 2013-12-08 NOTE — Progress Notes (Signed)
Changes to vent settings were made per MD.  Will obtain an ABG in 1 hour.  RT will continue to monitor.

## 2013-12-08 NOTE — Progress Notes (Signed)
eLink Physician-Brief Progress Note Patient Name: Lawrence Jennings DOB: [birth year] MRN: [id number]  Date of Service  12/08/2013   HPI/Events of Note     eICU Interventions  Hypokalemia, repleted    Intervention Category Minor Interventions: Electrolytes abnormality - evaluation and management  [name] 12/08/2013, 12:28 AM

## 2013-12-08 NOTE — Progress Notes (Signed)
PULMONARY / CRITICAL CARE MEDICINE   Name: Lawrence Jennings MRN: [id number] DOB: 07-03-1955    ADMISSION DATE:  11/15/2013 CONSULTATION DATE:  12/03/13   REFERRING MD :  ED  CHIEF COMPLAINT:  AMS  INITIAL PRESENTATION: 58 y/o M with PMH of psoriatic arthritis on Humira + Pred, recent admission (7/3-7/7) for FTT & cellulitis (doxy) who presented to [facility] ER on 7/31 after being found by family with AMS & weakness.  Intubated for hypoxic respiratory failure / ALI.    PMH - mild thrombocytopenia of unknown etiology,diabetes that has been poorly controlled.   STUDIES:  8/2 - Echo EF 60%  SIGNIFICANT EVENTS: 8/01 - admitted to ICU for hypoxic respiratory failure 8/03 - levo gtt weaned off 8/04 - CT Chest with Pneumomediastinum and RLL cavitary lesion 8/06 - breath stacking, changed MV to pressure control, changed sedation, large/dark residuals from OGT  SUBJECTIVE: E-Link reports hypokalemia, sedated overnight due to agitation, high peak pressure and pneumomediastinum. RN reports that patient is breath stacking and still with agitation. Large residuals noted from OGT.   VITAL SIGNS: Temp:  [97.2 F (36.2 C)-100.6 F (38.1 C)] 98.4 F (36.9 C) (08/06 1100) Pulse Rate:  [62-120] 76 (08/06 1100) Resp:  [16-27] 21 (08/06 1100) BP: (121-148)/(71-87) 122/74 mmHg (08/06 1100) SpO2:  [76 %-99 %] 99 % (08/06 1100) FiO2 (%):  [50 %-80 %] 80 % (08/06 1000) Weight:  [164 lb 10.9 oz (74.7 kg)] 164 lb 10.9 oz (74.7 kg) (08/06 0500)  HEMODYNAMICS: CVP:  [2 mmHg-6 mmHg] 3 mmHg  VENTILATOR SETTINGS: Vent Mode:  [-] PRVC FiO2 (%):  [50 %-80 %] 80 % Set Rate:  [20 bmp] 20 bmp Vt Set:  [600 mL] 600 mL PEEP:  [5 cmH20] 5 cmH20 Plateau Pressure:  [16 cmH20-22 cmH20] 21 cmH20  INTAKE / OUTPUT:  Intake/Output Summary (Last 24 hours) at 12/08/13 1131 Last data filed at 12/08/13 1004  Gross per 24 hour  Intake 6936.4 ml  Output   4910 ml  Net 2026.4 ml    PHYSICAL EXAMINATION: General: mildly  somnolent, cachectic, sedated Neuro:  Sedated, RASS -3 HEENT:  Proptosis, PERRL, EOMI, OP clear, dry mucosa Cardiovascular:  Tachycardic, regular rhythm, no MRG Lungs: Bilateral basilar crackles, tachypneic, no wheeze Abdomen:  Scaphoid, NTND,  Diminished BS, no HSM.  Large dark, green residuals.  Musculoskeletal:  +pedal edema Skin:   hyperpigmented areas on face and chest   LABS:  CBC  Recent Labs Lab 12/06/13 0458 12/07/13 0420 12/08/13 0545  WBC 11.2* 11.7* 10.4  HGB 8.8* 8.7* 8.2*  HCT 28.1* 27.5* 25.5*  PLT 62* 45* 42*   Coag's  Recent Labs Lab 11/11/2013 2157  INR 1.20   BMET  Recent Labs Lab 12/07/13 1515 12/07/13 2330 12/08/13 0545  NA 161* 156* 151*  K 3.2* 3.2* 3.4*  CL 126* 120* 115*  CO2 25 25 25   BUN 24* 22 19  CREATININE 0.94 0.93 0.90  GLUCOSE 181* 153* 190*   Electrolytes  Recent Labs Lab 12/05/13 0020  12/05/13 0740  12/07/13 1515 12/07/13 2330 12/08/13 0545  CALCIUM  --   < >  --   < > 6.9* 6.8* 6.7*  MG 2.3  --  2.2  --  1.9  --   --   PHOS 2.6  --  2.2*  --  2.3  --   --   < > = values in this interval not displayed. Sepsis Markers  Recent Labs Lab 11/17/2013 2157 11/15/2013 2206  LATICACIDVEN  --  2.06  PROCALCITON 1.64  --    ABG  Recent Labs Lab 11/15/2013 2158 12/03/13 0530  PHART 7.466* 7.346*  PCO2ART 24.4* 33.5*  PO2ART 130.0* 290.0*   Liver Enzymes  Recent Labs Lab 11/15/2013 2157 12/06/13 0458 12/08/13 0545  AST 87* 98* 92*  ALT 16 12 12   ALKPHOS 93 135* 154*  BILITOT 1.1 0.9 0.5  ALBUMIN 1.7* 1.4* 1.1*   Glucose  Recent Labs Lab 12/08/13 0324 12/08/13 0421 12/08/13 0431 12/08/13 0516 12/08/13 0556 12/08/13 0807  GLUCAP 113* 122* 118* 162* 169* 211*    Imaging Ct Chest W Contrast  12/07/2013   ADDENDUM REPORT: 12/07/2013 05:32  ADDENDUM: Please note findings were discussed with Dr. Molli Knock, not Dr Dema Severin (as initially reported).   Electronically Signed   By: Tiburcio Pea M.D.   On:  12/07/2013 05:32   12/07/2013   CLINICAL DATA:  Septic. Evaluate right middle or right lower lobe lesion  EXAM: CT CHEST, ABDOMEN, AND PELVIS WITH CONTRAST  TECHNIQUE: Multidetector CT imaging of the chest, abdomen and pelvis was performed following the standard protocol during bolus administration of intravenous contrast.  CONTRAST:  80 cc low osmolar contrast material  COMPARISON:  None.  FINDINGS: CT CHEST FINDINGS  THORACIC INLET/BODY WALL:  Subcutaneous emphysema in continuity with pneumomediastinum, likely a pulmonic air leak. Endotracheal and orogastric tubes are in good position. There is a left IJ catheter, tip at the SVC.  MEDIASTINUM:  Extensive pneumomediastinum. Given the cavitary lung lesion and intubation, this is likely pulmonic air leak. No cardiomegaly or pericardial effusion. There is coronary artery atherosclerosis which is age advanced. No acute arterial findings. Prominent peritracheal and subcarinal lymph nodes, presumably reactive. Wide esophagus is centrally low dense, suggesting fluid distension rather than wall thickening.  LUNG WINDOWS:  There is a 4 cm gas cavity with in the right upper lobe. There is irregular surrounding lung opacification. On admission chest radiography, a cavitary lesion is not visible, inconsistent with tumor. There is diffuse ground-glass and consolidating airspace disease. Dense consolidation present in the lower lungs. Ground-glass opacity extends into the upper chest. There is no definitive interlobular septal thickening. Small bilateral pleural effusions which are layering. Extrapleural gas noted at the apices. No pneumothorax.  OSSEOUS:  See below  CT ABDOMEN AND PELVIS FINDINGS  BODY WALL: Unremarkable.  Liver: No focal abnormality.  Biliary: Distended gallbladder but no definite pericholecystic inflammation. No calcified cholelithiasis.  Pancreas: Unremarkable.  Spleen: Unremarkable.  Adrenals: Unremarkable.  Kidneys and ureters: No hydronephrosis or stone.   Bladder: Decompressed around a Foley catheter.  Reproductive: Unremarkable.  Bowel: No bowel obstruction or inflammation. An orogastric tube reaches the proximal stomach. Negative appendix.  Retroperitoneum: No mass or adenopathy.  Peritoneum: Small volume simple body, accumulating in the pelvis.  Vascular: No acute findings.  OSSEOUS: No acute abnormalities.  Critical Value/emergent results were called by telephone at the time of interpretation on 12/07/2013 at 4:18 am to Dr. Stephanie Acre , who verbally acknowledged these results.  IMPRESSION: 1. New, extensive pneumomediastinum. 2. Recently developed 4 cm cavity in the right upper lobe, likely cavitary pneumonia. Bacterial and atypical infection should be considered based on the immunocompromised state, including invasive fungus. 3. Diffuse ground-glass and consolidative opacities, favored infectious. Based on biapical cysts and air leak, consider PCP pneumonia. The densest opacities are dependent, consider aspiration. 4. No acute intra-abdominal findings.  Electronically Signed: By: Tiburcio Pea M.D. On: 12/07/2013 04:25   Ct Abdomen Pelvis W Contrast  12/07/2013  ADDENDUM REPORT: 12/07/2013 05:32  ADDENDUM: Please note findings were discussed with Dr. Molli Knock, not Dr Dema Severin (as initially reported).   Electronically Signed   By: Tiburcio Pea M.D.   On: 12/07/2013 05:32   12/07/2013   CLINICAL DATA:  Septic. Evaluate right middle or right lower lobe lesion  EXAM: CT CHEST, ABDOMEN, AND PELVIS WITH CONTRAST  TECHNIQUE: Multidetector CT imaging of the chest, abdomen and pelvis was performed following the standard protocol during bolus administration of intravenous contrast.  CONTRAST:  80 cc low osmolar contrast material  COMPARISON:  None.  FINDINGS: CT CHEST FINDINGS  THORACIC INLET/BODY WALL:  Subcutaneous emphysema in continuity with pneumomediastinum, likely a pulmonic air leak. Endotracheal and orogastric tubes are in good position. There is a left IJ  catheter, tip at the SVC.  MEDIASTINUM:  Extensive pneumomediastinum. Given the cavitary lung lesion and intubation, this is likely pulmonic air leak. No cardiomegaly or pericardial effusion. There is coronary artery atherosclerosis which is age advanced. No acute arterial findings. Prominent peritracheal and subcarinal lymph nodes, presumably reactive. Wide esophagus is centrally low dense, suggesting fluid distension rather than wall thickening.  LUNG WINDOWS:  There is a 4 cm gas cavity with in the right upper lobe. There is irregular surrounding lung opacification. On admission chest radiography, a cavitary lesion is not visible, inconsistent with tumor. There is diffuse ground-glass and consolidating airspace disease. Dense consolidation present in the lower lungs. Ground-glass opacity extends into the upper chest. There is no definitive interlobular septal thickening. Small bilateral pleural effusions which are layering. Extrapleural gas noted at the apices. No pneumothorax.  OSSEOUS:  See below  CT ABDOMEN AND PELVIS FINDINGS  BODY WALL: Unremarkable.  Liver: No focal abnormality.  Biliary: Distended gallbladder but no definite pericholecystic inflammation. No calcified cholelithiasis.  Pancreas: Unremarkable.  Spleen: Unremarkable.  Adrenals: Unremarkable.  Kidneys and ureters: No hydronephrosis or stone.  Bladder: Decompressed around a Foley catheter.  Reproductive: Unremarkable.  Bowel: No bowel obstruction or inflammation. An orogastric tube reaches the proximal stomach. Negative appendix.  Retroperitoneum: No mass or adenopathy.  Peritoneum: Small volume simple body, accumulating in the pelvis.  Vascular: No acute findings.  OSSEOUS: No acute abnormalities.  Critical Value/emergent results were called by telephone at the time of interpretation on 12/07/2013 at 4:18 am to Dr. Stephanie Acre , who verbally acknowledged these results.  IMPRESSION: 1. New, extensive pneumomediastinum. 2. Recently developed 4 cm  cavity in the right upper lobe, likely cavitary pneumonia. Bacterial and atypical infection should be considered based on the immunocompromised state, including invasive fungus. 3. Diffuse ground-glass and consolidative opacities, favored infectious. Based on biapical cysts and air leak, consider PCP pneumonia. The densest opacities are dependent, consider aspiration. 4. No acute intra-abdominal findings.  Electronically Signed: By: Tiburcio Pea M.D. On: 12/07/2013 04:25     Antibiotics Zosyn 8/1>> Vanc 8/1>>8/3, restarted on 8/5 Bactrim 8/5 Micafungin 8/1>>   ASSESSMENT / PLAN:  PULMONARY  A:  Acute Hypoxic Resp Failure Favor HCAP & ALI  Resp alkalosis - salicylate, TSH levels nml Cavitation on CT with pneumomediastium Vent dysynchrony P:   Vent support, 8 cc/kg Wean PEEP/FiO2 for sats > 92% Keep plateau pressure <30cm, inc FiO2 to 50, keep sedated for the rest of the day Trend CXR Cont with Zosyn\Vanc and Micafungin (immunosuppressed on Humiria).  PCP smear negative will d\c bactrim. keep plateau pressures on MV <30cm for pneumomediastium  Will wean precedex, start versed gtt for breath stacking, monitor plateau pressures and MAP.  CARDIOVASCULAR CVL: 7/31 femoral triple lumen (right) placed by ED A:  Septic shock - levophed gtt weaned off 8/3 am  P:  Stress steroids in setting of chronic prednisone administration, started weaning on 8/4 to 50 q6 hrs (previouly 100 q8hrs)  RENAL A:   AKI - Likely secondary to volume depletion and hypotension, resolving Hypernatremia\Dehydration P:   Reduce D5W to @ 75cc\hr and re-eval in the pm (initial Free H2O Deficit at 4.5L) Trend BMP Monitor UOP Scheduled 40 KCL QD   GASTROINTESTINAL A:   Vent associated Dysphagia  ? Aspiration component Increased Gastric Residual / ABD Distention Hypertriglyceridemia  P:   Large residuals, still with no BM - abd xray, will consider starting reglan.  Consider swallow evaluation  post extubation PPI   HEMATOLOGIC A:   Thrombocytopenia - Mild - unknown etiology.  Noted since July 2015 P:  Trend CBC, monitor platelets, avoid toxic drugs.   INFECTIOUS A:   HCAP - in immunocompromised patient (Humira and prednisone) P:   Rx HCAP pending further work up [employer] 7/31 >> ng UC 7/31 >> ng Abx:  zosyn, start date 8/1, D3/x Micafungin, start date 8/1, D3/x  ENDOCRINE A:   DM II Chronic Steroid Administration   Proptosis - TSH nml  P:   SSI Tapering solu-cortef  Lacrilube for eye protection  NEUROLOGIC A:   AMS improved P:   RASS goal: 0-1 Daily WUA D/C propofol Precedex gtt Fentanyl gtt PRN versed  TODAY'S SUMMARY: 58 y/o with FTT, psoriatic arthritis admitted with HCAP, AKI, resolved septic shock, RLL\RML cavitation with pneumomediastium on CT chest, correcting Hyponatremia\dehydration, treating HCAP and possible PCP (on Humira). Cont to sedate today, inc FiO2, keep plateau pressures on MV <30cm for pneumomediastium    I have personally obtained a history, examined the patient, evaluated laboratory and imaging results, formulated the assessment and plan and placed orders.  CRITICAL CARE: The patient is critically ill with multiple organ systems failure and requires high complexity decision making for assessment and support, frequent evaluation and titration of therapies, application of advanced monitoring technologies and extensive interpretation of multiple databases. Critical Care Time devoted to patient care services described in this note is 35 minutes.    12/08/2013, 11:31 AM   Stephanie Acre, MD [place] [place] [facility] Pager [phone] On Call Pager [phone]

## 2013-12-08 NOTE — Progress Notes (Signed)
eLink Physician-Brief Progress Note Patient Name: Lawrence Jennings DOB: Nov 23, 1955 MRN: [id number]  Date of Service  12/08/2013   HPI/Events of Note     eICU Interventions  Hypokalemia, repleted    Intervention Category Minor Interventions: Electrolytes abnormality - evaluation and management  Gorman Safi 12/08/2013, 6:07 AM

## 2013-12-08 NOTE — Progress Notes (Signed)
eLink Physician-Brief Progress Note Patient Name: Ledger Heindl DOB: 03/07/1956 MRN: [id number]  Date of Service  12/08/2013   HPI/Events of Note   Hypernatremia, can't use gut, D5 adjusted this afternoon  eICU Interventions  Stat BMET   Intervention Category Major Interventions: Electrolyte abnormality - evaluation and management  Kimela Malstrom 12/08/2013, 11:55 PM

## 2013-12-08 NOTE — Progress Notes (Addendum)
NUTRITION FOLLOW UP  Pt meets criteria for SEVERE MALNUTRITION in the context of chronic illness as evidenced by severe fat and muscle mass depletion.  DOCUMENTATION CODES  Per approved criteria   -Severe malnutrition in the context of chronic illness    Intervention:    Initiate trickle feedings with Vital AF 1.2 at 25 ml/h.   Once TF tolerance established, increase TF to goal rate of 70 ml/h (1680 ml per day) to provide 2016 kcals, 126 gm protein, 1362 ml free water daily.  If increased residuals > 500 ml x 2, recommend start prokinetic agent such as Reglan. Follow TF protocol for checking residuals and TF rate adjustment.  Nutrition Dx:   Inadequate oral intake related to inability to eat as evidenced by NPO status, ongoing.  Goal:   Intake to meet >90% of estimated nutrition needs, unmet.  Monitor:   TF restart/tolerance/adequacy, weight trend, labs, vent status.  Assessment:   Pt with FTT, psoriatic arthritis admitted with HCAP, AKI, acute lung injury, and septic shock. Pt on chronic steroids. He also has a history of hypertension and diabetes that has been poorly controlled.   TF on hold since Monday, 8/3 due to increased residuals of 450, 500, 675 ml. 15 ml residuals recorded 8/5 at 20:00.  Per discussion with CCM physician during ICU rounds, will start Reglan today and initiate trickle feedings at 25 ml/h.  Patient is currently intubated on ventilator support MV: 13.5 L/min Temp (24hrs), Avg:98.9 F (37.2 C), Min:97.2 F (36.2 C), Max:100.6 F (38.1 C)  Propofol: none  Height: Ht Readings from Last 1 Encounters:  2013/12/24 5' 11.65" (1.82 m)    Weight Status:  Up with positive fluid status Wt Readings from Last 1 Encounters:  12/08/13 164 lb 10.9 oz (74.7 kg)  12/03/13  144 lb 13.5 oz (65.7 kg)   Re-estimated needs:  Kcal: 2035 Protein: 100-115 gm Fluid: 2 L  Skin: stage 2 pressure ulcer to buttocks  Diet Order: NPO   Intake/Output Summary (Last 24  hours) at 12/08/13 1019 Last data filed at 12/08/13 1004  Gross per 24 hour  Intake   6574 ml  Output   4620 ml  Net   1954 ml    Last BM: PTA   Labs:   Recent Labs Lab 12/05/13 0020  12/05/13 0740  12/07/13 1515 12/07/13 2330 12/08/13 0545  NA  --   < >  --   < > 161* 156* 151*  K  --   < >  --   < > 3.2* 3.2* 3.4*  CL  --   < >  --   < > 126* 120* 115*  CO2  --   < >  --   < > 25 25 25   BUN  --   < >  --   < > 24* 22 19  CREATININE  --   < >  --   < > 0.94 0.93 0.90  CALCIUM  --   < >  --   < > 6.9* 6.8* 6.7*  MG 2.3  --  2.2  --  1.9  --   --   PHOS 2.6  --  2.2*  --  2.3  --   --   GLUCOSE  --   < >  --   < > 181* 153* 190*  < > = values in this interval not displayed.  CBG (last 3)   Recent Labs  12/07/13 1950 12/08/13 0556 12/08/13  0807  GLUCAP 260* 169* 211*    Scheduled Meds: . antiseptic oral rinse  7 mL Mouth Rinse QID  . artificial tears   Both Eyes 3 times per day  . chlorhexidine  15 mL Mouth Rinse BID  . feeding supplement (PRO-STAT SUGAR FREE 64)  30 mL Oral BID  . hydrocortisone sod succinate (SOLU-CORTEF) inj  50 mg Intravenous Q6H  . insulin aspart  2-6 Units Subcutaneous 6 times per day  . insulin glargine  10 Units Subcutaneous Q24H  . ipratropium-albuterol  3 mL Nebulization Q4H  . micafungin (MYCAMINE) IV  100 mg Intravenous Daily  . pantoprazole (PROTONIX) IV  40 mg Intravenous Q12H  . piperacillin-tazobactam (ZOSYN)  IV  3.375 g Intravenous Q8H  . potassium chloride  10 mEq Intravenous Q1 Hr x 4  . sulfamethoxazole-trimethoprim  360 mg Intravenous 3 times per day  . vancomycin  1,000 mg Intravenous Q12H    Continuous Infusions: . dexmedetomidine 1.2 mcg/kg/hr (12/08/13 0804)  . dextrose    . dextrose 125 mL/hr at 12/07/13 1101  . fentaNYL infusion INTRAVENOUS 300 mcg/hr (12/08/13 0902)    Joaquin Courts, RD, LDN, CNSC Pager [phone] After Hours Pager [phone]

## 2013-12-09 ENCOUNTER — Encounter (HOSPITAL_COMMUNITY): Admission: EM | Disposition: E | Payer: Self-pay | Source: Home / Self Care | Attending: Internal Medicine

## 2013-12-09 ENCOUNTER — Inpatient Hospital Stay (HOSPITAL_COMMUNITY): Payer: Medicaid Other

## 2013-12-09 ENCOUNTER — Encounter (HOSPITAL_COMMUNITY): Payer: Self-pay

## 2013-12-09 DIAGNOSIS — R6520 Severe sepsis without septic shock: Secondary | ICD-10-CM

## 2013-12-09 DIAGNOSIS — R6521 Severe sepsis with septic shock: Secondary | ICD-10-CM

## 2013-12-09 DIAGNOSIS — R627 Adult failure to thrive: Secondary | ICD-10-CM

## 2013-12-09 DIAGNOSIS — R5381 Other malaise: Secondary | ICD-10-CM

## 2013-12-09 DIAGNOSIS — A419 Sepsis, unspecified organism: Principal | ICD-10-CM

## 2013-12-09 DIAGNOSIS — R5383 Other fatigue: Secondary | ICD-10-CM

## 2013-12-09 DIAGNOSIS — J984 Other disorders of lung: Secondary | ICD-10-CM

## 2013-12-09 DIAGNOSIS — J9600 Acute respiratory failure, unspecified whether with hypoxia or hypercapnia: Secondary | ICD-10-CM

## 2013-12-09 HISTORY — PX: ESOPHAGOGASTRODUODENOSCOPY: SHX5428

## 2013-12-09 LAB — BASIC METABOLIC PANEL
ANION GAP: 10 (ref 5–15)
ANION GAP: 11 (ref 5–15)
Anion gap: 10 (ref 5–15)
BUN: 19 mg/dL (ref 6–23)
BUN: 19 mg/dL (ref 6–23)
BUN: 20 mg/dL (ref 6–23)
CALCIUM: 6.8 mg/dL — AB (ref 8.4–10.5)
CALCIUM: 6.5 mg/dL — AB (ref 8.4–10.5)
CO2: 25 mEq/L (ref 19–32)
CO2: 26 mEq/L (ref 19–32)
CO2: 26 meq/L (ref 19–32)
CREATININE: 0.96 mg/dL (ref 0.50–1.35)
CREATININE: 1 mg/dL (ref 0.50–1.35)
Calcium: 6.9 mg/dL — ABNORMAL LOW (ref 8.4–10.5)
Chloride: 110 mEq/L (ref 96–112)
Chloride: 111 mEq/L (ref 96–112)
Chloride: 112 mEq/L (ref 96–112)
Creatinine, Ser: 1.09 mg/dL (ref 0.50–1.35)
GFR calc Af Amer: >90 mL/min (ref >90)
GFR calc non Af Amer: 82 mL/min — ABNORMAL LOW (ref >90)
GFR, EST AFRICAN AMERICAN: 86 mL/min — AB (ref >90)
GFR, EST NON AFRICAN AMERICAN: 74 mL/min — AB (ref >90)
GLUCOSE: 111 mg/dL — AB (ref 70–99)
GLUCOSE: 50 mg/dL — AB (ref 70–99)
Glucose, Bld: 156 mg/dL — ABNORMAL HIGH (ref 70–99)
Potassium: 3.6 mEq/L — ABNORMAL LOW (ref 3.7–5.3)
Potassium: 3.8 mEq/L (ref 3.7–5.3)
Potassium: 3.9 mEq/L (ref 3.7–5.3)
Sodium: 146 mEq/L (ref 137–147)
Sodium: 147 mEq/L (ref 137–147)
Sodium: 148 mEq/L — ABNORMAL HIGH (ref 137–147)

## 2013-12-09 LAB — CBC WITH DIFFERENTIAL/PLATELET
BASOS PCT: 0 % (ref 0–1)
Basophils Absolute: 0 10*3/uL (ref 0.0–0.1)
EOS PCT: 0 % (ref 0–5)
Eosinophils Absolute: 0 10*3/uL (ref 0.0–0.7)
HEMATOCRIT: 29.9 % — AB (ref 39.0–52.0)
Hemoglobin: 9.4 g/dL — ABNORMAL LOW (ref 13.0–17.0)
Lymphocytes Relative: 3 % — ABNORMAL LOW (ref 12–46)
Lymphs Abs: 0.3 10*3/uL — ABNORMAL LOW (ref 0.7–4.0)
MCH: 27.5 pg (ref 26.0–34.0)
MCHC: 31.4 g/dL (ref 30.0–36.0)
MCV: 87.4 fL (ref 78.0–100.0)
MONO ABS: 0.2 10*3/uL (ref 0.1–1.0)
Monocytes Relative: 2 % — ABNORMAL LOW (ref 3–12)
Neutro Abs: 11 10*3/uL — ABNORMAL HIGH (ref 1.7–7.7)
Neutrophils Relative %: 95 % — ABNORMAL HIGH (ref 43–77)
Platelets: 44 10*3/uL — ABNORMAL LOW (ref 150–400)
RBC: 3.42 MIL/uL — ABNORMAL LOW (ref 4.22–5.81)
RDW: 19 % — ABNORMAL HIGH (ref 11.5–15.5)
WBC: 11.5 10*3/uL — ABNORMAL HIGH (ref 4.0–10.5)

## 2013-12-09 LAB — BLOOD GAS, ARTERIAL
ACID-BASE DEFICIT: 1.1 mmol/L (ref 0.0–2.0)
ACID-BASE EXCESS: 1.3 mmol/L (ref 0.0–2.0)
Acid-Base Excess: 2.5 mmol/L — ABNORMAL HIGH (ref 0.0–2.0)
Acid-base deficit: 4.2 mmol/L — ABNORMAL HIGH (ref 0.0–2.0)
BICARBONATE: 24.6 meq/L — AB (ref 20.0–24.0)
Bicarbonate: 26.5 mEq/L — ABNORMAL HIGH (ref 20.0–24.0)
Bicarbonate: 25.5 mEq/L — ABNORMAL HIGH (ref 20.0–24.0)
Bicarbonate: 22 mEq/L (ref 20.0–24.0)
DRAWN BY: 31101
DRAWN BY: 39899
DRAWN BY: 313061
FIO2: 0.7 %
FIO2: 0.8 %
FIO2: 0.9 %
FIO2: 0.8 %
LHR: 15 {breaths}/min
LHR: 15 {breaths}/min
LHR: 30 {breaths}/min
MECHVT: 530 mL
O2 SAT: 91.7 %
O2 Saturation: 87.9 %
O2 Saturation: 84.5 %
O2 Saturation: 94.1 %
PATIENT TEMPERATURE: 98.6
PATIENT TEMPERATURE: 98.6
PCO2 ART: 40.5 mmHg (ref 35.0–45.0)
PCO2 ART: 42.5 mmHg (ref 35.0–45.0)
PCO2 ART: 52.8 mmHg — AB (ref 35.0–45.0)
PEEP/CPAP: 5 cmH2O
PEEP: 5 cmH2O
PEEP: 10 cmH2O
PEEP: 10 cmH2O
PH ART: 7.432 (ref 7.350–7.450)
PH ART: 7.398 (ref 7.350–7.450)
PO2 ART: 71.9 mmHg — AB (ref 80.0–100.0)
Patient temperature: 99.3
Patient temperature: 98.6
RATE: 30 resp/min
TCO2: 27.7 mmol/L (ref 0–100)
TCO2: 26.8 mmol/L (ref 0–100)
TCO2: 23.6 mmol/L (ref 0–100)
TCO2: 26.3 mmol/L (ref 0–100)
VT: 460 mL
pCO2 arterial: 53.2 mmHg — ABNORMAL HIGH (ref 35.0–45.0)
pH, Arterial: 7.243 — ABNORMAL LOW (ref 7.350–7.450)
pH, Arterial: 7.288 — ABNORMAL LOW (ref 7.350–7.450)
pO2, Arterial: 56.4 mmHg — ABNORMAL LOW (ref 80.0–100.0)
pO2, Arterial: 54.1 mmHg — ABNORMAL LOW (ref 80.0–100.0)
pO2, Arterial: 85.2 mmHg (ref 80.0–100.0)

## 2013-12-09 LAB — CULTURE, BLOOD (ROUTINE X 2)
CULTURE: NO GROWTH
Culture: NO GROWTH

## 2013-12-09 LAB — POCT I-STAT 3, ART BLOOD GAS (G3+)
Acid-base deficit: 2 mmol/L (ref 0.0–2.0)
Bicarbonate: 24.7 mEq/L — ABNORMAL HIGH (ref 20.0–24.0)
O2 Saturation: 95 %
TCO2: 26 mmol/L (ref 0–100)
pCO2 arterial: 51.3 mmHg — ABNORMAL HIGH (ref 35.0–45.0)
pH, Arterial: 7.29 — ABNORMAL LOW (ref 7.350–7.450)
pO2, Arterial: 84 mmHg (ref 80.0–100.0)

## 2013-12-09 LAB — GLUCOSE, CAPILLARY
GLUCOSE-CAPILLARY: 60 mg/dL — AB (ref 70–99)
GLUCOSE-CAPILLARY: 89 mg/dL (ref 70–99)
Glucose-Capillary: 102 mg/dL — ABNORMAL HIGH (ref 70–99)
Glucose-Capillary: 115 mg/dL — ABNORMAL HIGH (ref 70–99)
Glucose-Capillary: 123 mg/dL — ABNORMAL HIGH (ref 70–99)
Glucose-Capillary: 142 mg/dL — ABNORMAL HIGH (ref 70–99)
Glucose-Capillary: 144 mg/dL — ABNORMAL HIGH (ref 70–99)
Glucose-Capillary: 96 mg/dL (ref 70–99)

## 2013-12-09 LAB — CULTURE, RESPIRATORY

## 2013-12-09 LAB — CULTURE, RESPIRATORY W GRAM STAIN

## 2013-12-09 LAB — BODY FLUID CELL COUNT WITH DIFFERENTIAL
Eos, Fluid: NONE SEEN %
Lymphs, Fluid: 4 %
Monocyte-Macrophage-Serous Fluid: 5 % — ABNORMAL LOW (ref 50–90)
Neutrophil Count, Fluid: 91 % — ABNORMAL HIGH (ref 0–25)
WBC FLUID: 540 uL (ref 0–1000)

## 2013-12-09 LAB — LACTIC ACID, PLASMA
LACTIC ACID, VENOUS: 2.3 mmol/L — AB (ref 0.5–2.2)
LACTIC ACID, VENOUS: 2.3 mmol/L — AB (ref 0.5–2.2)

## 2013-12-09 SURGERY — EGD (ESOPHAGOGASTRODUODENOSCOPY)
Anesthesia: Moderate Sedation

## 2013-12-09 MED ORDER — CISATRACURIUM BOLUS VIA INFUSION
0.1000 mg/kg | Freq: Once | INTRAVENOUS | Status: AC
  Administered 2013-12-09: 7.5 mg via INTRAVENOUS
  Filled 2013-12-09: qty 8

## 2013-12-09 MED ORDER — POTASSIUM CHLORIDE 10 MEQ/50ML IV SOLN
10.0000 meq | INTRAVENOUS | Status: AC
  Administered 2013-12-09 (×4): 10 meq via INTRAVENOUS
  Filled 2013-12-09 (×4): qty 50

## 2013-12-09 MED ORDER — SODIUM CHLORIDE 0.9 % IV BOLUS (SEPSIS)
500.0000 mL | Freq: Once | INTRAVENOUS | Status: AC
  Administered 2013-12-09: 500 mL via INTRAVENOUS

## 2013-12-09 MED ORDER — SODIUM CHLORIDE 0.9 % IV SOLN: INTRAVENOUS | Status: DC

## 2013-12-09 MED ORDER — SODIUM CHLORIDE 0.9 % IV SOLN
3.0000 ug/kg/min | INTRAVENOUS | Status: DC
  Administered 2013-12-09 (×2): 3 ug/kg/min via INTRAVENOUS
  Administered 2013-12-10 – 2013-12-11 (×2): 4 ug/kg/min via INTRAVENOUS
  Filled 2013-12-09 (×4): qty 20

## 2013-12-09 MED ORDER — FENTANYL CITRATE 0.05 MG/ML IJ SOLN: 100.0000 ug | INTRAMUSCULAR | Status: DC | PRN

## 2013-12-09 MED ORDER — DEXTROSE 50 % IV SOLN
INTRAVENOUS | Status: AC
  Administered 2013-12-09: 25 mL
  Filled 2013-12-09: qty 50

## 2013-12-09 MED ORDER — PHENYLEPHRINE HCL 10 MG/ML IJ SOLN
30.0000 ug/min | INTRAVENOUS | Status: DC
  Administered 2013-12-09: 30 ug/min via INTRAVENOUS
  Filled 2013-12-09 (×2): qty 1

## 2013-12-09 MED ORDER — SODIUM CHLORIDE 0.9 % IV BOLUS (SEPSIS)
1000.0000 mL | Freq: Once | INTRAVENOUS | Status: AC
  Administered 2013-12-09: 1000 mL via INTRAVENOUS

## 2013-12-09 NOTE — Progress Notes (Signed)
Notified Dr. Dema Severin of pt's HR in the 140-150s and CVP of 3. Will continue to monitor.

## 2013-12-09 NOTE — Procedures (Signed)
Arterial Catheter Insertion Procedure Note Lawrence Jennings [id number] [birth year]  Procedure: Insertion of Arterial Catheter  Indications: Frequent blood sampling  Procedure Details Consent: Unable to obtain consent because of emergent medical necessity. Time Out: Verified patient identification, verified procedure, site/side was marked, verified correct patient position, special equipment/implants available, medications/allergies/relevent history reviewed, required imaging and test results available.  Performed  Maximum sterile technique was used including antiseptics, cap, gloves, gown, hand hygiene, mask and sheet. Skin prep: Chlorhexidine; local anesthetic administered 20 gauge catheter was inserted into left radial artery using the Seldinger technique.  Evaluation Blood flow good; BP tracing good. Complications: No apparent complications.   Lawrence Jennings 12/26/2013

## 2013-12-09 NOTE — Progress Notes (Signed)
eLink Physician-Brief Progress Note Patient Name: Lawrence Jennings DOB: [birth year] MRN: [id number]  Date of Service  12/19/2013   HPI/Events of Note   Sinus tachycardia Not agitated  eICU Interventions  Saline bolus x1   Intervention Category Major Interventions: Arrhythmia - evaluation and management  MCQUAID, DOUGLAS 12/29/2013, 4:23 AM

## 2013-12-09 NOTE — Progress Notes (Signed)
Vent settings changed due to starting patient on ARDS protocol per MD.  RT will continue to monitor.

## 2013-12-09 NOTE — Progress Notes (Signed)
NUTRITION FOLLOW UP  Pt meets criteria for SEVERE MALNUTRITION in the context of chronic illness as evidenced by severe fat and muscle mass depletion.   DOCUMENTATION CODES  Per approved criteria   -Severe malnutrition in the context of chronic illness    Intervention:    When able to resume tube feeding, utilize 12M PEPuP Protocol: initiate TF via NGT with Oxepa at 25 ml/h and Prostat 30 ml TID on day 1; on day 2, increase to goal rate of 45 ml/h (1080 ml per day) to provide 1920 kcals, 113 gm protein, 848 ml free water daily.  If unable to resume TF within the next 48 hours, may need to consider TPN.  Nutrition Dx:   Inadequate oral intake related to inability to eat as evidenced by NPO status, ongoing.  Goal:   Intake to meet >90% of estimated nutrition needs, unmet.  Monitor:   TF restart/tolerance/adequacy, weight trend, labs, vent status.  Assessment:   Pt with FTT, psoriatic arthritis admitted with HCAP, AKI, acute lung injury, and septic shock. Pt on chronic steroids. He also has a history of hypertension and diabetes that has been poorly controlled.   Discussed patient in ICU rounds today. Trickle TF was never initiated yesterday. Patient now on the ARDS protocol. OGT was removed. Small bore NGT to be placed for medication administration, not to be used for suction. OGT suction trauma and proximal UES ulceration noted during EGD today. No plans to start feedings at this time. May need TPN.  Patient is currently intubated on ventilator support MV: 11.1 L/min Temp (24hrs), Avg:99.3 F (37.4 C), Min:98.5 F (36.9 C), Max:100.4 F (38 C)  Propofol: none  Height: Ht Readings from Last 1 Encounters:  12-22-13 5' 11.65" (1.82 m)    Weight Status:  Up with positive fluid status Wt Readings from Last 1 Encounters:  12/03/2013 166 lb 0.1 oz (75.3 kg)  12/03/13  144 lb 13.5 oz (65.7 kg)   Re-estimated needs:  Kcal: 1941 Protein: 100-115 gm Fluid: 2 L  Skin: stage 2  pressure ulcer to buttocks  Diet Order:  NPO   Intake/Output Summary (Last 24 hours) at 12/21/2013 1151 Last data filed at 12/07/2013 1100  Gross per 24 hour  Intake 3836.64 ml  Output   2320 ml  Net 1516.64 ml    Last BM: PTA   Labs:   Recent Labs Lab 12/05/13 0020  12/05/13 0740  12/07/13 1515  12/08/13 1400 12/08/13 2355 12/29/2013 0425  NA  --   < >  --   < > 161*  < > 150* 148* 146  K  --   < >  --   < > 3.2*  < > 3.4* 3.6* 3.9  CL  --   < >  --   < > 126*  < > 113* 112 110  CO2  --   < >  --   < > 25  < > 24 26 26   BUN  --   < >  --   < > 24*  < > 18 19 19   CREATININE  --   < >  --   < > 0.94  < > 0.87 0.96 1.00  CALCIUM  --   < >  --   < > 6.9*  < > 6.8* 6.9* 6.8*  MG 2.3  --  2.2  --  1.9  --   --   --   --   PHOS 2.6  --  2.2*  --  2.3  --   --   --   --   GLUCOSE  --   < >  --   < > 181*  < > 185* 50* 111*  < > = values in this interval not displayed.  CBG (last 3)   Recent Labs  12/04/2013 0042 12/21/2013 0353 12/23/2013 0810  GLUCAP 123* 96 144*    Scheduled Meds: . antiseptic oral rinse  7 mL Mouth Rinse QID  . artificial tears   Both Eyes 3 times per day  . chlorhexidine  15 mL Mouth Rinse BID  . famotidine (PEPCID) IV  20 mg Intravenous Q12H  . feeding supplement (VITAL AF 1.2 CAL)  1,000 mL Per Tube Q24H  . hydrocortisone sod succinate (SOLU-CORTEF) inj  50 mg Intravenous Q6H  . insulin aspart  2-6 Units Subcutaneous 6 times per day  . ipratropium-albuterol  3 mL Nebulization Q4H  . micafungin (MYCAMINE) IV  100 mg Intravenous Daily  . piperacillin-tazobactam (ZOSYN)  IV  3.375 g Intravenous Q8H  . vancomycin  1,000 mg Intravenous Q12H    Continuous Infusions: . cisatracurium (NIMBEX) infusion 3 mcg/kg/min (12/22/2013 1048)  . dextrose 1,000 mL (12/26/2013 0820)  . fentaNYL infusion INTRAVENOUS 300 mcg/hr (12/11/2013 0800)  . midazolam (VERSED) infusion 8 mg/hr (12/11/2013 1055)    Joaquin Courts, RD, LDN, CNSC Pager [phone] After Hours Pager  [phone]

## 2013-12-09 NOTE — Progress Notes (Signed)
eLink Physician-Brief Progress Note Patient Name: Lawrence Jennings DOB: 1955-08-23 MRN: [id number]  Date of Service  12/21/2013   HPI/Events of Note   Na correcting appropriately hypokalemia  eICU Interventions  Maintain D5W at dose KCL replacement   Intervention Category Minor Interventions: Electrolytes abnormality - evaluation and management  MCQUAID, DOUGLAS 12/27/2013, 12:39 AM

## 2013-12-09 NOTE — Procedures (Signed)
Date: 12/24/2013 LOC:2M10C/2M10C-01 MRN# [id number] Tzion Wedel Aug 25, 1955   PROCEDURE DATE: 12/26/2013   TIME: 11:45 AM NAME:  Monish Haliburton  DOB:08/11/1955  MRN: [id number] LOC:  2M10C/2M10C-01    HOSP DAY: @LENGTHOFSTAYDAYS @  CODE STATUS:      Code Status Orders        Start     Ordered   12/03/13 0110  Full code   Continuous     12/03/13 0115        ATTENDING:    Indications/Preliminary Diagnosis:   Consent: (Place X beside choice/s below)  The benefits, risks and possible complications of the procedure were        explained to:  ___ patient  ___ patient's family  ___ other:___________  who verbalized understanding and gave:  ___ verbal  ___ written  ___ verbal and written  ___ telephone  __x_ other:_emergent consent by 2 phyisican consent. (Dr. 02/02/14, Dr. Dema Severin)   x  Unable to obtain consent; procedure performed on emergent basis.     Other:    Bronchoscope Serial #:     PRESEDATION ASSESSMENT: History and Physical has been performed. Patient meds and allergies have been reviewed. Baseline vital signs, sedation score, oxygenation status, and cardiac rhythm were reviewed. Patient was deemed to be in satisfactory condition to undergo the procedure.  PREMEDICATIONS:   Sedative/Narcotic Amt Dose   Versed  mg   Fentanyl  mcg  Diprivan  mg        Airway Prep (Place X beside choice below)   1% Transtracheal Lidocaine Anesthetization 7 cc   Patient prepped per Bronchoscopy Lab Policy       Insertion Route (Place X beside choice below)   Nasal   Oral  x Endotracheal Tube   Tracheostomy   INTRAPROCEDURE MEDICATIONS:  Sedative/Narcotic Amt Dose   Versed 1 mg   Fentanyl 50 mcg  Diprivan  mg       Medication Amt Dose  Medication Amt Dose  Xylocaine 2%  cc  Epinephrine 1:10,000 sol  cc  Xylocaine 4%  cc  Cocaine  cc   TECHNICAL PROCEDURES: (Place X beside choice below)   Procedures  Description    None     Electrocautery     Cryotherapy    Balloon Dilatation     Bronchography     Stent Placement   x  Therapeutic Aspiration     Laser/Argon Plasma    Brachytherapy Catheter Placement    Foreign Body Removal     SPECIMENS (Sites): (Place X beside choice below)  Specimens Description   No Specimens Obtained     Washings   x Lavage BAL of RML   Biopsies    Fine Needle Aspirates    Brushings    Sputum      ESTIMATED BLOOD LOSS: none  COMPLICATIONS/RESOLUTION: none  PROCEDURE DETAILS: Timeout performed and correct patient, name, & ID confirmed. Following prep per Pulmonary policy, appropriate sedation was administered.  Airway exam proceeded with findings, technical procedures, and specimen collection as noted below. At the end of exam the scope was withdrawn without incident. Impression and Plan as noted below.   Inspection: mild thick secretions noted at the RMS, there were no endobronchial lesion noted throughout the R and L lung inspection.  Mucosa clear, very spade airways.  Procedure: BAL of RML  IMPLANTED DEVICE(S):none  IMPRESSION:POST-PROCEDURE DX: PNA, HCAP, Right Cavitary lesion   RECOMMENDATION/PLAN: Cont with antibiotics and await culture results.   The attending physician was present  for procedure.   ADDITIONAL COMMENTS: None   Stephanie Acre, MD [facility] Pager [phone] On Call Pager [phone]

## 2013-12-09 NOTE — Consult Note (Signed)
Unassigned Consult  Reason for Consult: Gastroccult positive fluid, High OG output, and history of dysphagia Referring Physician: CCM  Amanda Peahomas Cutillo HPI: This is a 58 year old male with a PMH of dysphagia/odynophagia, DM, and psoriatic arthritis admitted to the hospital with respiratory failure.  He was altered and he required intubation.  He was recently admitted to the hospital for complaints of dysphagia/odynophagia and the ENT work up was negative.  GI was not consulted during that admission.  He has a recent history of doxycyline use for a cellulitis and he is being treated for a pneumonia as well as sepsis.  Routinely he uses prednisone 20 mg QD fro his arthritis and he was taking Humira.  Recently it has been noted that he has a high OG output of dark material.  The material was tested and it was positive for occult blood.  I am unable to obtain any further history as he is intubated.  Past Medical History  Diagnosis Date  . Allergic reaction   . Sinus disease   . Back pain   . Arthritis   . Edema   . Hypertension   . Diabetes mellitus without complication     insulin/metformin    Past Surgical History  Procedure Laterality Date  . No past surgeries      No family history on file.  Social History:  reports that he quit smoking about 2 months ago. His smoking use included Cigarettes. He has a 5 pack-year smoking history. He has never used smokeless tobacco. He reports that he does not drink alcohol or use illicit drugs.  Allergies: No Known Allergies  Medications:  Scheduled: . antiseptic oral rinse  7 mL Mouth Rinse QID  . artificial tears   Both Eyes 3 times per day  . chlorhexidine  15 mL Mouth Rinse BID  . famotidine (PEPCID) IV  20 mg Intravenous Q12H  . feeding supplement (VITAL AF 1.2 CAL)  1,000 mL Per Tube Q24H  . hydrocortisone sod succinate (SOLU-CORTEF) inj  50 mg Intravenous Q6H  . insulin aspart  2-6 Units Subcutaneous 6 times per day  .  ipratropium-albuterol  3 mL Nebulization Q4H  . micafungin (MYCAMINE) IV  100 mg Intravenous Daily  . piperacillin-tazobactam (ZOSYN)  IV  3.375 g Intravenous Q8H  . vancomycin  1,000 mg Intravenous Q12H   Continuous: . cisatracurium (NIMBEX) infusion 3 mcg/kg/min (12/19/2013 1048)  . dextrose 1,000 mL (12/07/2013 0820)  . fentaNYL infusion INTRAVENOUS 300 mcg/hr (12/06/2013 0800)  . midazolam (VERSED) infusion 8 mg/hr (12/03/2013 1055)    Results for orders placed during the hospital encounter of 02/08/14 (from the past 24 hour(s))  POCT I-STAT 3, ART BLOOD GAS (G3+)     Status: Abnormal   Collection Time    12/08/13  1:51 PM      Result Value Ref Range   pH, Arterial 7.464 (*) 7.350 - 7.450   pCO2 arterial 35.9  35.0 - 45.0 mmHg   pO2, Arterial 159.0 (*) 80.0 - 100.0 mmHg   Bicarbonate 25.7 (*) 20.0 - 24.0 mEq/L   TCO2 27  0 - 100 mmol/L   O2 Saturation 100.0     Acid-Base Excess 2.0  0.0 - 2.0 mmol/L   Patient temperature 99.4 F     Collection site RADIAL, ALLEN'S TEST ACCEPTABLE     Drawn by Operator     Sample type ARTERIAL    BASIC METABOLIC PANEL     Status: Abnormal   Collection Time  12/08/13  2:00 PM      Result Value Ref Range   Sodium 150 (*) 137 - 147 mEq/L   Potassium 3.4 (*) 3.7 - 5.3 mEq/L   Chloride 113 (*) 96 - 112 mEq/L   CO2 24  19 - 32 mEq/L   Glucose, Bld 185 (*) 70 - 99 mg/dL   BUN 18  6 - 23 mg/dL   Creatinine, Ser 6.06  0.50 - 1.35 mg/dL   Calcium 6.8 (*) 8.4 - 10.5 mg/dL   GFR calc non Af Amer >90  >90 mL/min   GFR calc Af Amer >90  >90 mL/min   Anion gap 13  5 - 15  OCCULT BLOOD GASTRIC / DUODENUM (SPECIMEN CUP)     Status: Abnormal   Collection Time    12/08/13  3:19 PM      Result Value Ref Range   pH, Gastric NOT DONE     Occult Blood, Gastric POSITIVE (*) NEGATIVE  GLUCOSE, CAPILLARY     Status: Abnormal   Collection Time    12/08/13  3:55 PM      Result Value Ref Range   Glucose-Capillary 162 (*) 70 - 99 mg/dL  GLUCOSE, CAPILLARY      Status: None   Collection Time    12/08/13  8:25 PM      Result Value Ref Range   Glucose-Capillary 79  70 - 99 mg/dL  BASIC METABOLIC PANEL     Status: Abnormal   Collection Time    12/08/13 11:55 PM      Result Value Ref Range   Sodium 148 (*) 137 - 147 mEq/L   Potassium 3.6 (*) 3.7 - 5.3 mEq/L   Chloride 112  96 - 112 mEq/L   CO2 26  19 - 32 mEq/L   Glucose, Bld 50 (*) 70 - 99 mg/dL   BUN 19  6 - 23 mg/dL   Creatinine, Ser 3.01  0.50 - 1.35 mg/dL   Calcium 6.9 (*) 8.4 - 10.5 mg/dL   GFR calc non Af Amer >90  >90 mL/min   GFR calc Af Amer >90  >90 mL/min   Anion gap 10  5 - 15  GLUCOSE, CAPILLARY     Status: Abnormal   Collection Time    12/19/2013 12:21 AM      Result Value Ref Range   Glucose-Capillary 60 (*) 70 - 99 mg/dL  GLUCOSE, CAPILLARY     Status: Abnormal   Collection Time    12/19/2013 12:42 AM      Result Value Ref Range   Glucose-Capillary 123 (*) 70 - 99 mg/dL  BLOOD GAS, ARTERIAL     Status: Abnormal   Collection Time    12/27/2013  3:48 AM      Result Value Ref Range   FIO2 0.70     Delivery systems VENTILATOR     Mode PRESSURE CONTROL     Rate 15     Peep/cpap 5.0     pH, Arterial 7.432  7.350 - 7.450   pCO2 arterial 40.5  35.0 - 45.0 mmHg   pO2, Arterial 56.4 (*) 80.0 - 100.0 mmHg   Bicarbonate 26.5 (*) 20.0 - 24.0 mEq/L   TCO2 27.7  0 - 100 mmol/L   Acid-Base Excess 2.5 (*) 0.0 - 2.0 mmol/L   O2 Saturation 87.9     Patient temperature 98.6     Collection site RIGHT RADIAL     Drawn by [id number]  Sample type ARTERIAL DRAW     Allens test (pass/fail) PASS  PASS  GLUCOSE, CAPILLARY     Status: None   Collection Time    12/10/2013  3:53 AM      Result Value Ref Range   Glucose-Capillary 96  70 - 99 mg/dL  BASIC METABOLIC PANEL     Status: Abnormal   Collection Time    12/13/2013  4:25 AM      Result Value Ref Range   Sodium 146  137 - 147 mEq/L   Potassium 3.9  3.7 - 5.3 mEq/L   Chloride 110  96 - 112 mEq/L   CO2 26  19 - 32 mEq/L   Glucose, Bld  111 (*) 70 - 99 mg/dL   BUN 19  6 - 23 mg/dL   Creatinine, Ser 5.52  0.50 - 1.35 mg/dL   Calcium 6.8 (*) 8.4 - 10.5 mg/dL   GFR calc non Af Amer 82 (*) >90 mL/min   GFR calc Af Amer >90  >90 mL/min   Anion gap 10  5 - 15  CBC WITH DIFFERENTIAL     Status: Abnormal   Collection Time    12/08/2013  4:25 AM      Result Value Ref Range   WBC 11.5 (*) 4.0 - 10.5 K/uL   RBC 3.42 (*) 4.22 - 5.81 MIL/uL   Hemoglobin 9.4 (*) 13.0 - 17.0 g/dL   HCT 08.0 (*) 22.3 - 36.1 %   MCV 87.4  78.0 - 100.0 fL   MCH 27.5  26.0 - 34.0 pg   MCHC 31.4  30.0 - 36.0 g/dL   RDW 22.4 (*) 49.7 - 53.0 %   Platelets 44 (*) 150 - 400 K/uL   Neutrophils Relative % 95 (*) 43 - 77 %   Neutro Abs 11.0 (*) 1.7 - 7.7 K/uL   Lymphocytes Relative 3 (*) 12 - 46 %   Lymphs Abs 0.3 (*) 0.7 - 4.0 K/uL   Monocytes Relative 2 (*) 3 - 12 %   Monocytes Absolute 0.2  0.1 - 1.0 K/uL   Eosinophils Relative 0  0 - 5 %   Eosinophils Absolute 0.0  0.0 - 0.7 K/uL   Basophils Relative 0  0 - 1 %   Basophils Absolute 0.0  0.0 - 0.1 K/uL  LACTIC ACID, PLASMA     Status: Abnormal   Collection Time    12/14/2013  4:25 AM      Result Value Ref Range   Lactic Acid, Venous 2.3 (*) 0.5 - 2.2 mmol/L  GLUCOSE, CAPILLARY     Status: Abnormal   Collection Time    12/15/2013  8:10 AM      Result Value Ref Range   Glucose-Capillary 144 (*) 70 - 99 mg/dL  BLOOD GAS, ARTERIAL     Status: Abnormal   Collection Time    12/08/2013  8:40 AM      Result Value Ref Range   FIO2 0.80     Delivery systems VENTILATOR     Mode PRESSURE CONTROL     Rate 15     Peep/cpap 5.0     pH, Arterial 7.398  7.350 - 7.450   pCO2 arterial 42.5  35.0 - 45.0 mmHg   pO2, Arterial 54.1 (*) 80.0 - 100.0 mmHg   Bicarbonate 25.5 (*) 20.0 - 24.0 mEq/L   TCO2 26.8  0 - 100 mmol/L   Acid-Base Excess 1.3  0.0 - 2.0 mmol/L   O2  Saturation 84.5     Patient temperature 99.3     Collection site RIGHT RADIAL     Drawn by [phone]     Sample type ARTERIAL DRAW     Allens test  (pass/fail) PASS  PASS     Dg Chest Port 1 View  December 21, 2013   CLINICAL DATA:  Worsening hypoxia and tachycardia  EXAM: PORTABLE CHEST - 1 VIEW  COMPARISON:  12/08/2013  FINDINGS: Endotracheal tube ends between the carina and clavicles. Left IJ catheter tip remains at the SVC. Orogastric tube in good position, side port likely advanced in the interim.  Unchanged diffuse airspace disease with cavity in the right mid chest. Unchanged pneumomediastinum and diffuse subcutaneous emphysema. Possible small left apical pneumothorax.  These results were called by telephone at the time of interpretation on 2013-12-21 at 5:05 am to Dr. Max Fickle , who verbally acknowledged these results.  IMPRESSION: 1. Possible small left apical pneumothorax (5% or less) with certainty diminished by extensive subcutaneous gas. 2. Tubes and central line remain in good position. 3. Unchanged diffuse airspace disease with right lung cavity. Unchanged pneumomediastinum.   Electronically Signed   By: Tiburcio Pea M.D.   On: 12-21-2013 05:16   Dg Chest Port 1 View  12/08/2013   CLINICAL DATA:  Endotracheal tube  EXAM: PORTABLE CHEST - 1 VIEW  COMPARISON:  Chest x-ray from 2 days prior.  Chest CT from yesterday  FINDINGS: Endotracheal tube ends at the clavicular heads. Left IJ catheter remains in good position. The side port of the orogastric tube is near the GE junction.  Unchanged diffuse interstitial and airspace disease. Compared to CT, unchanged pneumomediastinum and subcutaneous gas. No definite pneumothorax.  IMPRESSION: 1. Unchanged positioning of tubes and central line. The orogastric tube side port is at the GE junction, consider advancement for more secure positioning. 2. Unchanged diffuse aeration disturbance with right lung cavity. 3. Unchanged pneumomediastinum and subcutaneous gas. No definitive pneumothorax.   Electronically Signed   By: Tiburcio Pea M.D.   On: 12/08/2013 06:24   Dg Abd Portable 1v  12/08/2013    CLINICAL DATA:  Large residuals in the stomach after feeding.  EXAM: PORTABLE ABDOMEN - 1 VIEW  COMPARISON:  Radiograph dated 12/03/2013 and CT scan dated 12/07/2013  FINDINGS: NG tube tip is in the fundus of the stomach. There is a small amount of air scattered throughout the nondistended colon. No worrisome abdominal calcifications. No visible free air or free fluid. No osseous abnormality.  IMPRESSION: Benign appearing abdomen.   Electronically Signed   By: Geanie Cooley M.D.   On: 12/08/2013 13:04    ROS:  As stated above in the HPI otherwise negative.  Blood pressure 97/64, pulse 138, temperature 99.7 F (37.6 C), temperature source Core (Comment), resp. rate 18, height 5' 11.65" (1.82 m), weight 166 lb 0.1 oz (75.3 kg), SpO2 99.00%.    PE: Gen: Intubated and sedated Neck: Supple, no LAD Lungs: CTA Bilaterally CV: RRR without M/G/R ABM: Soft, NTND, +BS Ext: No C/C/E  Assessment/Plan: 1) Dysphagia. 2) Gastroccult positive. 3) Respiratory failure. 4) Sepsis.   I will perform an EGD for further evaluation.  Doxycycline is a culprit for pill-induced esophagitis.  He is also at risk for a Candidal esophagitis with his baseline need for prednisone and Humira.  My evaluation of the fluid is that it appears to be bilious.  The large output may be secondary to the OG tube stimulation and/or a gastric outlet obstruction.  Plan:  1) EGD today.  Darlene Brozowski D 24-Dec-2013, 12:12 PM

## 2013-12-09 NOTE — Progress Notes (Addendum)
Report given to Lamount Cranker, RN. Pt tolerated procedure well. OG tube removed per Dr. Elnoria Howard.

## 2013-12-09 NOTE — Progress Notes (Addendum)
Dr. Dema Severin notified of pt's blood pressure with MAPs in the 50s. Second 500cc bolus ordered. Will continue to monitor.

## 2013-12-09 NOTE — Progress Notes (Signed)
Hypoglycemic Event  CBG: 60  Treatment: 1/2 amp d50  Symptoms: none  Follow-up CBG: Time:0040 CBG Result:123  Possible Reasons for Event: npo    Eilleen Kempf  Remember to initiate Hypoglycemia Order Set & complete

## 2013-12-09 NOTE — Op Note (Signed)
[name] [address] [place], [address]   OPERATIVE PROCEDURE REPORT  PATIENT: Lawrence Jennings, Lawrence Jennings  MR#: [id number] BIRTHDATE: 19-Feb-1956  GENDER: Male ENDOSCOPIST: Jeani Hawking, MD ASSISTANT:   Beryle Beams, technician and Priscella Mann, technician PROCEDURE DATE: 12/27/2013 PROCEDURE:   EGD, diagnostic ASA CLASS:   Class IV INDICATIONS:Hematemesis. MEDICATIONS: None TOPICAL ANESTHETIC:   None  DESCRIPTION OF PROCEDURE:   After the risks benefits and alternatives of the procedure were thoroughly explained, informed consent was obtained.  The PENTAX GASTOROSCOPE [id number]  endoscope was introduced through the mouth  and advanced to the second portion of the duodenum Without limitations.      The instrument was slowly withdrawn as the mucosa was fully examined.      FINDINGS: In the most proximal portion of the UES there was a superficial ulcer.  This may be the sourceof his dysphagia complaints.  The Z-line was sharp.  No evidence of esophagitis.  In the proximal gastric lumen there was evidence of suction trauma ulcerations wtih overlying clots.  This is the etiology of the gostroccult positivity and dark material obtained in the OG tube. The antrum and pylorus were normal as well as the duodenum. Retroflexed views revealed no abnormalities.     The scope was then withdrawn from the patient and the procedure terminated.  COMPLICATIONS: There were no complications.  IMPRESSION: 1) OG suction trauma. 2) Proximal UES ulceration.  RECOMMENDATIONS: 1) D/C OG tube. 2) Use a Dobhoff tube to provide enteral feedings. 3) No further GI intervention.  Signing off.  _______________________________ Rosalie DoctorJeani Hawking, MD 12/30/2013 2:29 PM    PATIENT NAME:  Lawrence Jennings, Lawrence Jennings MR#: [id number]

## 2013-12-09 NOTE — Procedures (Signed)
Bedside Bronchoscopy Procedure Note Lawrence Jennings [id number] 03/15/1956  Procedure: Bronchoscopy Indications: Diagnostic evaluation of the airways and Obtain specimens for culture and/or other diagnostic studies  Procedure Details: ET Tube Size:7.5 ET Tube secured at lip (cm):23  Bite block in place: No In preparation for procedure, Patient hyper-oxygenated with 100 % FiO2 Airway entered and the following bronchi were examined: RUL, RML, RLL, LUL, LLL and Bronchi.   Bronchoscope removed.  , Patient placed back on 100% FiO2 at conclusion of procedure.    Evaluation BP 101/62  Pulse 136  Temp(Src) 99.7 F (37.6 C) (Core (Comment))  Resp 30  Ht 5' 11.65" (1.82 m)  Wt 166 lb 0.1 oz (75.3 kg)  BMI 22.73 kg/m2  SpO2 100% Breath Sounds:Clear O2 sats: stable throughout Patient's Current Condition: stable Specimens:  BAL Complications: No apparent complications Patient did tolerate procedure well.   Leonard Downing 12/29/2013, 1:33 PM

## 2013-12-09 NOTE — Progress Notes (Signed)
eLink Physician-Brief Progress Note Patient Name: Lawrence Jennings DOB: 07-08-1955 MRN: [id number]  Date of Service  08-Jan-2014   HPI/Events of Note   Radiology says possibly tiny left pneumothorax, but not big enough to cause change in hypoxemia or hemodynamics  eICU Interventions  monitor   Intervention Category Major Interventions: Respiratory failure - evaluation and management  MCQUAID, DOUGLAS Jan 08, 2014, 5:06 AM

## 2013-12-09 NOTE — Progress Notes (Signed)
PULMONARY / CRITICAL CARE MEDICINE   Name: Lawrence Jennings MRN: [id number] DOB: [birth year]    ADMISSION DATE:  11/26/2013 CONSULTATION DATE:  12/03/13   REFERRING MD :  ED  CHIEF COMPLAINT:  AMS  INITIAL PRESENTATION: 58 y/o M with PMH of psoriatic arthritis on Humira + Pred, recent admission (7/3-7/7) for FTT & cellulitis (doxy) who presented to [facility] ER on 7/31 after being found by family with AMS & weakness.  Intubated for hypoxic respiratory failure / ALI.    PMH - mild thrombocytopenia of unknown etiology,diabetes that has been poorly controlled.   STUDIES:  8/2 - Echo EF 60% 8/04 - CT Chest with Pneumomediastinum and RLL cavitary lesion  SIGNIFICANT EVENTS: 8/01 - admitted to ICU for hypoxic respiratory failure 8/03 - levo gtt weaned off 8/06 - breath stacking, changed MV to pressure control, changed sedation, large/dark residuals from OGT 8/07 - small LUL PTX, worsening hypotension and CXR, ARDS protocol started, and paralytics, upper EGD with small gastric ulcers.   SUBJECTIVE: E-Link reports low sbp, got 1L NS bolus, low CVP at 3, worsening CXR with ARDS type picture, inc gastric residuals (guiac positive).   VITAL SIGNS: Temp:  [98.1 F (36.7 C)-100.4 F (38 C)] 99.5 F (37.5 C) (08/07 0900) Pulse Rate:  [76-149] 141 (08/07 0900) Resp:  [15-30] 19 (08/07 0900) BP: (80-127)/(47-99) 82/50 mmHg (08/07 0900) SpO2:  [89 %-100 %] 95 % (08/07 0900) FiO2 (%):  [60 %-80 %] 80 % (08/07 0800) Weight:  [166 lb 0.1 oz (75.3 kg)] 166 lb 0.1 oz (75.3 kg) (08/07 0500)  HEMODYNAMICS: CVP:  [2 mmHg] 2 mmHg  VENTILATOR SETTINGS: Vent Mode:  [-] PCV FiO2 (%):  [60 %-80 %] 80 % Set Rate:  [15 bmp-20 bmp] 15 bmp PEEP:  [5 cmH20] 5 cmH20 Plateau Pressure:  [20 cmH20-25 cmH20] 21 cmH20  INTAKE / OUTPUT:  Intake/Output Summary (Last 24 hours) at 12/30/2013 0906 Last data filed at 12/03/2013 0700  Gross per 24 hour  Intake 3734.17 ml  Output   2350 ml  Net 1384.17 ml    PHYSICAL  EXAMINATION: General: mildly somnolent, cachectic, sedated Neuro:  Sedated, RASS -3 HEENT:  Proptosis, PERRL, EOMI, OP clear, dry mucosa, subcutaneous air (crepitus) Cardiovascular:  Tachycardic, regular rhythm, no MRG Lungs: Bilateral basilar crackles, tachypneic, dec basilar breath sounds Abdomen:  Scaphoid, NTND,  Diminished BS, no HSM.  Large dark, green residuals.  Musculoskeletal:  +pedal edema Skin:   hyperpigmented areas on face and chest   LABS:  CBC  Recent Labs Lab 12/07/13 0420 12/08/13 0545 12/12/2013 0425  WBC 11.7* 10.4 11.5*  HGB 8.7* 8.2* 9.4*  HCT 27.5* 25.5* 29.9*  PLT 45* 42* 44*   Coag's  Recent Labs Lab 11/18/2013 2157  INR 1.20   BMET  Recent Labs Lab 12/08/13 1400 12/08/13 2355 12/06/2013 0425  NA 150* 148* 146  K 3.4* 3.6* 3.9  CL 113* 112 110  CO2 24 26 26   BUN 18 19 19   CREATININE 0.87 0.96 1.00  GLUCOSE 185* 50* 111*   Electrolytes  Recent Labs Lab 12/05/13 0020  12/05/13 0740  12/07/13 1515  12/08/13 1400 12/08/13 2355 12/11/2013 0425  CALCIUM  --   < >  --   < > 6.9*  < > 6.8* 6.9* 6.8*  MG 2.3  --  2.2  --  1.9  --   --   --   --   PHOS 2.6  --  2.2*  --  2.3  --   --   --   --   < > =  values in this interval not displayed. Sepsis Markers  Recent Labs Lab December 19, 2013 2157 2013-12-19 2206 12/08/2013 0425  LATICACIDVEN  --  2.06 2.3*  PROCALCITON 1.64  --   --    ABG  Recent Labs Lab 12/08/13 1351 01/02/2014 0348 12/30/2013 0840  PHART 7.464* 7.432 7.398  PCO2ART 35.9 40.5 42.5  PO2ART 159.0* 56.4* 54.1*   Liver Enzymes  Recent Labs Lab 2013/12/19 2157 12/06/13 0458 12/08/13 0545  AST 87* 98* 92*  ALT 16 12 12   ALKPHOS 93 135* 154*  BILITOT 1.1 0.9 0.5  ALBUMIN 1.7* 1.4* 1.1*   Glucose  Recent Labs Lab 12/08/13 1138 12/08/13 1555 12/08/13 2025 12/11/2013 0021 01/02/2014 0042 12/30/2013 0353  GLUCAP 206* 162* 79 60* 123* 96    Imaging Dg Chest Port 1 View  12/08/2013   CLINICAL DATA:  Endotracheal tube   EXAM: PORTABLE CHEST - 1 VIEW  COMPARISON:  Chest x-ray from 2 days prior.  Chest CT from yesterday  FINDINGS: Endotracheal tube ends at the clavicular heads. Left IJ catheter remains in good position. The side port of the orogastric tube is near the GE junction.  Unchanged diffuse interstitial and airspace disease. Compared to CT, unchanged pneumomediastinum and subcutaneous gas. No definite pneumothorax.  IMPRESSION: 1. Unchanged positioning of tubes and central line. The orogastric tube side port is at the GE junction, consider advancement for more secure positioning. 2. Unchanged diffuse aeration disturbance with right lung cavity. 3. Unchanged pneumomediastinum and subcutaneous gas. No definitive pneumothorax.   Electronically Signed   By: 02/07/2014 M.D.   On: 12/08/2013 06:24   Dg Abd Portable 1v  12/08/2013   CLINICAL DATA:  Large residuals in the stomach after feeding.  EXAM: PORTABLE ABDOMEN - 1 VIEW  COMPARISON:  Radiograph dated 12/03/2013 and CT scan dated 12/07/2013  FINDINGS: NG tube tip is in the fundus of the stomach. There is a small amount of air scattered throughout the nondistended colon. No worrisome abdominal calcifications. No visible free air or free fluid. No osseous abnormality.  IMPRESSION: Benign appearing abdomen.   Electronically Signed   By: 02/06/2014 M.D.   On: 12/08/2013 13:04     Antibiotics Zosyn 8/1>> Vanc 8/1>>8/3, restarted on 8/5 Bactrim 8/5 Micafungin 8/1>>   ASSESSMENT / PLAN:  PULMONARY  A:  Acute Hypoxic Resp Failure/ARDS Sepsis\Cavitation on CT with pneumomediastium, concern for Fungal infection Small LUL PTX Hypoxia  P:   ARDS Protocol today, paralytics Trend CXR and abgs Cont with Zosyn\Vanc and Micafungin (immunosuppressed on Humiria), will get bronch with BAL for a good specimen and yield today. ID consult  PCP smear negative will d\c bactrim. PTX is small, but with hypoxia will have to increase PEEP and may place chest tube - for  now will monitor with serial cxr and hemodynamics  CARDIOVASCULAR CVL: 7/31 femoral triple lumen (right) placed by ED A:  Septic shock - levophed gtt weaned off 8/3 am  Hypotension - on versed and fentanyl P:  Stress steroids in setting of chronic prednisone administration, started weaning on 8/4 to 50 q6 hrs (previouly 100 q8hrs), will keep at 200 total since clinical status is worsening  RENAL A:   AKI - Likely secondary to volume depletion and hypotension, resolving Hypernatremia\Dehydration P:   Reduce D5W to @ 75cc\hr and re-eval in the pm (initial Free H2O Deficit at 4.5L) Trend BMP Monitor UOP Scheduled 40 KCL QD   GASTROINTESTINAL A:   Vent associated Dysphagia  ? Aspiration component Increased Gastric  Residual / ABD Distention Hypertriglyceridemia  P:   Large residuals, still with no BM -  Large gastric residuals - guiac positive, GI consulted (Dr. Elnoria Howard) - small multiple gastric ulcers (related to OGT suction trauma) --> recommend to use small, flexible, weighted tube (Dubhoff) and only use for meds and flushes, no feeding through tube at this time. DO NOT PLACE OGT TO SUCTION.   PPI   HEMATOLOGIC A:   Thrombocytopenia - Mild - unknown etiology.  Noted since July 2015 P:  Trend CBC, monitor platelets, avoid toxic drugs.   INFECTIOUS A:   HCAP - in immunocompromised patient (Humira and prednisone) P:   Rx HCAP now with Cavitary lesion, high concern for fungal infection - will get bronchoscopy today with BAL ( will adjust abx\antifungal based on BAL results).  BCx2 7/31 >> ng UC 8/1 >> ng Sputum 8/5 >> K. pneumonia Abx:  zosyn, start date 8/1, D3/x Vanc, start date 8/5  Micafungin, start date 8/1, D3/x  ENDOCRINE A:   DM II Chronic Steroid Administration   Proptosis - TSH nml  P:   SSI Tapering solu-cortef  Lacrilube for eye protection  NEUROLOGIC A:   AMS improved P:   RASS goal: -1 Daily WUA Propofol stopped due to high trigs Precedex  stopped due not controlling agitation  Versed gtt Fentanyl gtt PRN versed  TODAY'S SUMMARY: 59 y/o with FTT, psoriatic arthritis admitted with HCAP, AKI, resolved septic shock, RLL\RML cavitation with pneumomediastium on CT chest and LUL PTX on CXR, correcting Hyponatremia\dehydration, treating HCAP and possible fungus (on Humira). Today placed on ARDS protocol, paralyzed and obtain bronch. Upper Endoscopy showed small gastric ulcers related to OGT suction trauma, may use dubhoff tube to meds and flushes, no feeding, no suction.    I have personally obtained a history, examined the patient, evaluated laboratory and imaging results, formulated the assessment and plan and placed orders.  CRITICAL CARE: The patient is critically ill with multiple organ systems failure and requires high complexity decision making for assessment and support, frequent evaluation and titration of therapies, application of advanced monitoring technologies and extensive interpretation of multiple databases. Critical Care Time devoted to patient care services described in this note is 45 minutes.    12/07/2013, 9:06 AM   Stephanie Acre, MD [facility] Pager [phone] On Call Pager [phone]

## 2013-12-10 ENCOUNTER — Inpatient Hospital Stay (HOSPITAL_COMMUNITY): Payer: Medicaid Other

## 2013-12-10 DIAGNOSIS — R4182 Altered mental status, unspecified: Secondary | ICD-10-CM

## 2013-12-10 DIAGNOSIS — R918 Other nonspecific abnormal finding of lung field: Secondary | ICD-10-CM

## 2013-12-10 LAB — BASIC METABOLIC PANEL
Anion gap: 10 (ref 5–15)
Anion gap: 11 (ref 5–15)
BUN: 25 mg/dL — AB (ref 6–23)
BUN: 28 mg/dL — AB (ref 6–23)
CHLORIDE: 112 meq/L (ref 96–112)
CHLORIDE: 113 meq/L — AB (ref 96–112)
CO2: 23 meq/L (ref 19–32)
CO2: 23 meq/L (ref 19–32)
Calcium: 6.5 mg/dL — ABNORMAL LOW (ref 8.4–10.5)
Calcium: 6.5 mg/dL — ABNORMAL LOW (ref 8.4–10.5)
Creatinine, Ser: 1.33 mg/dL (ref 0.50–1.35)
Creatinine, Ser: 1.51 mg/dL — ABNORMAL HIGH (ref 0.50–1.35)
GFR calc Af Amer: 58 mL/min — ABNORMAL LOW (ref >90)
GFR calc Af Amer: 68 mL/min — ABNORMAL LOW (ref >90)
GFR calc non Af Amer: 50 mL/min — ABNORMAL LOW (ref >90)
GFR, EST NON AFRICAN AMERICAN: 58 mL/min — AB (ref >90)
GLUCOSE: 126 mg/dL — AB (ref 70–99)
Glucose, Bld: 156 mg/dL — ABNORMAL HIGH (ref 70–99)
POTASSIUM: 4 meq/L (ref 3.7–5.3)
Potassium: 3.7 mEq/L (ref 3.7–5.3)
SODIUM: 145 meq/L (ref 137–147)
SODIUM: 147 meq/L (ref 137–147)

## 2013-12-10 LAB — CALCIUM, IONIZED: Calcium, Ion: 1.06 mmol/L — ABNORMAL LOW (ref 1.12–1.23)

## 2013-12-10 LAB — POCT I-STAT 3, ART BLOOD GAS (G3+)
ACID-BASE DEFICIT: 17 mmol/L — AB (ref 0.0–2.0)
Bicarbonate: 9.3 mEq/L — ABNORMAL LOW (ref 20.0–24.0)
O2 Saturation: 94 %
TCO2: 10 mmol/L (ref 0–100)
pCO2 arterial: 23.6 mmHg — ABNORMAL LOW (ref 35.0–45.0)
pH, Arterial: 7.205 — ABNORMAL LOW (ref 7.350–7.450)
pO2, Arterial: 82 mmHg (ref 80.0–100.0)

## 2013-12-10 LAB — GLUCOSE, CAPILLARY
Glucose-Capillary: 148 mg/dL — ABNORMAL HIGH (ref 70–99)
Glucose-Capillary: 135 mg/dL — ABNORMAL HIGH (ref 70–99)
Glucose-Capillary: 142 mg/dL — ABNORMAL HIGH (ref 70–99)

## 2013-12-10 LAB — LACTIC ACID, PLASMA: Lactic Acid, Venous: 2 mmol/L (ref 0.5–2.2)

## 2013-12-10 MED ORDER — VORICONAZOLE 200 MG IV SOLR
4.0000 mg/kg | Freq: Two times a day (BID) | INTRAVENOUS | Status: DC
  Filled 2013-12-10: qty 310

## 2013-12-10 MED ORDER — METRONIDAZOLE IN NACL 5-0.79 MG/ML-% IV SOLN
500.0000 mg | Freq: Three times a day (TID) | INTRAVENOUS | Status: DC
  Administered 2013-12-10 – 2013-12-16 (×19): 500 mg via INTRAVENOUS
  Filled 2013-12-10 (×21): qty 100

## 2013-12-10 MED ORDER — DEXTROSE 5 % IV SOLN
1.0000 g | Freq: Two times a day (BID) | INTRAVENOUS | Status: DC
  Administered 2013-12-10 – 2013-12-11 (×3): 1 g via INTRAVENOUS
  Filled 2013-12-10 (×4): qty 1

## 2013-12-10 MED ORDER — VORICONAZOLE 200 MG IV SOLR
6.0000 mg/kg | Freq: Two times a day (BID) | INTRAVENOUS | Status: AC
  Administered 2013-12-10 – 2013-12-11 (×2): 470 mg via INTRAVENOUS
  Filled 2013-12-10 (×2): qty 470

## 2013-12-10 MED ORDER — IPRATROPIUM-ALBUTEROL 0.5-2.5 (3) MG/3ML IN SOLN
3.0000 mL | Freq: Four times a day (QID) | RESPIRATORY_TRACT | Status: DC
  Administered 2013-12-10 – 2013-12-17 (×26): 3 mL via RESPIRATORY_TRACT
  Filled 2013-12-10 (×27): qty 3

## 2013-12-10 NOTE — Progress Notes (Signed)
ANTIBIOTIC CONSULT NOTE - INITIAL  Pharmacy Consult for Cefepime + Voriconazole + Vancomycin Indication: Empiric PNA/sepsis coverage  No Known Allergies  Patient Measurements: Height: 5' 11.65" (182 cm) Weight: 173 lb 8 oz (78.7 kg) IBW/kg (Calculated) : 76.8  Vital Signs: Temp: 97 F (36.1 C) (08/08 1000) Temp src: Core (Comment) (08/08 1000) BP: 95/62 mmHg (08/08 1100) Pulse Rate: 99 (08/08 1100) Intake/Output from previous day: 08/07 0701 - 08/08 0700 In: 5062.1 [I.V.:3262.1; IV Piggyback:1800] Out: 1145 [Urine:1145] Intake/Output from this shift: Total I/O In: 717.2 [I.V.:567.2; IV Piggyback:150] Out: 300 [Urine:300]  Labs:  Recent Labs  12/08/13 0545  12/16/2013 0425 12/07/2013 1300 12/06/2013 2345 12/10/13 0524  WBC 10.4  --  11.5*  --   --   --   HGB 8.2*  --  9.4*  --   --   --   PLT 42*  --  44*  --   --   --   CREATININE 0.90  < > 1.00 1.09 1.33 1.51*  < > = values in this interval not displayed. Estimated Creatinine Clearance: 59.3 ml/min (by C-G formula based on Cr of 1.51). No results found for this basename: VANCOTROUGH, VANCOPEAK, VANCORANDOM, GENTTROUGH, GENTPEAK, GENTRANDOM, TOBRATROUGH, TOBRAPEAK, TOBRARND, AMIKACINPEAK, AMIKACINTROU, AMIKACIN,  in the last 72 hours   Microbiology: Recent Results (from the past 720 hour(s))  CULTURE, BLOOD (ROUTINE X 2)     Status: None   Collection Time    11/30/2013  9:39 PM      Result Value Ref Range Status   Specimen Description BLOOD LEFT ARM   Final   Special Requests BOTTLES DRAWN AEROBIC ONLY 2CC   Final   Culture  Setup Time     Final   Value: 12/03/2013 03:43     Performed at [facility]   Culture     Final   Value: NO GROWTH 5 DAYS     Performed at [facility]   Report Status 12/08/2013 FINAL   Final  CULTURE, BLOOD (ROUTINE X 2)     Status: None   Collection Time    11/07/2013  9:57 PM      Result Value Ref Range Status   Specimen Description BLOOD RIGHT FEMORAL ARTERY   Final   Special Requests BOTTLES DRAWN AEROBIC AND ANAEROBIC [facility] EACH   Final   Culture  Setup Time     Final   Value: 12/03/2013 03:43     Performed at [facility]   Culture     Final   Value: NO GROWTH 5 DAYS     Performed at [facility]   Report Status 12/07/2013 FINAL   Final  URINE CULTURE     Status: None   Collection Time    12/03/13  2:07 AM      Result Value Ref Range Status   Specimen Description URINE, RANDOM   Final   Special Requests NONE   Final   Culture  Setup Time     Final   Value: 12/03/2013 12:35     Performed at [facility] Count     Final   Value: NO GROWTH     Performed at [facility]   Culture     Final   Value: NO GROWTH     Performed at [facility]   Report Status 12/04/2013 FINAL   Final  RAPID STREP SCREEN     Status: None   Collection Time  12/03/13  2:08 AM      Result Value Ref Range Status   Streptococcus, Group A Screen (Direct) NEGATIVE  NEGATIVE Final   Comment: (NOTE)     A Rapid Antigen test may result negative if the antigen level in the     sample is below the detection level of this test. The FDA has not     cleared this test as a stand-alone test therefore the rapid antigen     negative result has reflexed to a Group A Strep culture.  CULTURE, GROUP A STREP     Status: None   Collection Time    12/03/13  2:08 AM      Result Value Ref Range Status   Specimen Description THROAT   Final   Special Requests NONE   Final   Culture     Final   Value: STREPTOCOCCUS,BETA HEMOLYTIC NOT GROUP A     Performed at [facility]   Report Status 12/05/2013 FINAL   Final  MRSA PCR SCREENING     Status: None   Collection Time    12/03/13  2:38 AM      Result Value Ref Range Status   MRSA by PCR NEGATIVE  NEGATIVE Final   Comment:            The GeneXpert MRSA Assay (FDA     approved for NASAL specimens     only), is one component of a     comprehensive MRSA colonization     surveillance  program. It is not     intended to diagnose MRSA     infection nor to guide or     monitor treatment for     MRSA infections.  CULTURE, RESPIRATORY (NON-EXPECTORATED)     Status: None   Collection Time    12/07/13 12:31 PM      Result Value Ref Range Status   Specimen Description TRACHEAL ASPIRATE   Final   Special Requests Immunocompromised   Final   Gram Stain     Final   Value: FEW WBC PRESENT,BOTH PMN AND MONONUCLEAR     RARE SQUAMOUS EPITHELIAL CELLS PRESENT     NO ORGANISMS SEEN     Performed at [facility]   Culture     Final   Value: FEW KLEBSIELLA PNEUMONIAE     Performed at [facility]   Report Status 12/16/2013 FINAL   Final   Organism ID, Bacteria KLEBSIELLA PNEUMONIAE   Final  PNEUMOCYSTIS JIROVECI SMEAR BY DFA     Status: None   Collection Time    12/07/13 12:38 PM      Result Value Ref Range Status   Specimen Source-PJSRC ENDOTRACHEAL   Final   Pneumocystis jiroveci Ag NEGATIVE   Final   Comment: Performed at [facility]  CULTURE, BAL-QUANTITATIVE     Status: None   Collection Time    12/28/2013 12:17 PM      Result Value Ref Range Status   Specimen Description BRONCHIAL ALVEOLAR LAVAGE   Final   Special Requests Immunocompromised   Final   Gram Stain     Final   Value: FEW WBC PRESENT,BOTH PMN AND MONONUCLEAR     NO SQUAMOUS EPITHELIAL CELLS SEEN     NO ORGANISMS SEEN     Performed at [facility] Count PENDING   Incomplete   Culture PENDING   Incomplete   Report Status PENDING  Incomplete    Medical History: Past Medical History  Diagnosis Date  . Allergic reaction   . Sinus disease   . Back pain   . Arthritis   . Edema   . Hypertension   . Diabetes mellitus without complication     insulin/metformin    Assessment: 57 YOM admitted on 8/1 after being found down with AMS + weakness. Empiric antibiotics were started to cover for r/o sepsis/PNA. Given the patient's history of chronic steroids &  humira - fungal coverage was started with Micafungin. A chest CT on 8/5 revelaed penumomediastinum and likely cavitary PNA (4 cm lesion) with ground glass opacities. PCP was negative. RCx from 8/5 grew fairly sensitive K PNA, BAL was done on 8/7.  ID was consulted on 8/8 and antibiotics are being adjusted to Cefepime + Vanc + Flagyl + Voriconazole. BAL cx are still pending including fungal smear. The patient's renal function has noted to worsen, SCr 1.51 << 1.33 << 1.09, CrCl~60 ml/min, UOP/24h: 0.6 ml/kg/hr. Will start Voriconazole IV for now however given declining renal function, will likely need to transition to a po alternative soon given that the solvent  (cyclodextrin) can accumulate with reduced renal function. Will also plan to check a Vancomycin trough this evening given the patient's bump in SCr.   Goal of Therapy:  Vancomycin trough of 15-20 mcg/ml Proper antibiotics for infection/cultures adjusted for renal/hepatic function  Plan:  1. Hold Vancomycin doses for now 2. Obtain a Vancomycin trough at 0000 - will plan to redose pending this level 3. Start Cefepime 1g IV every 12 hours 4. Start Voriconazole 470 mg (6 mg/kg) q12h x 2 doses to load followed by 310 mg (4 mg/kg) every 12 hours 5. Will continue to watch renal function for possible transition of voriconazole to a po tablet or solution 6. Will continue to follow renal function, culture results, LOT, and antibiotic de-escalation plans   Georgina Pillion, PharmD, BCPS Clinical Pharmacist Pager: [phone] 12/10/2013 12:09 PM

## 2013-12-10 NOTE — Progress Notes (Signed)
PULMONARY / CRITICAL CARE MEDICINE   Name: Lawrence Jennings MRN: [id number] DOB: [birth year]    ADMISSION DATE:  11/29/2013 CONSULTATION DATE:  12/03/13   REFERRING MD :  ED  CHIEF COMPLAINT:  AMS  INITIAL PRESENTATION: 58 y/o M with PMH of psoriatic arthritis on Humira + Pred, recent admission (7/3-7/7) for FTT & cellulitis (doxy) who presented to [facility] ER on 7/31 after being found by family with AMS & weakness.  Intubated for hypoxic respiratory failure / ALI.    PMH - mild thrombocytopenia of unknown etiology,diabetes that has been poorly controlled.   STUDIES:  8/2 - Echo EF 60% 8/04 - CT Chest with Pneumomediastinum and RLL cavitary lesion  SIGNIFICANT EVENTS: 8/01 - admitted to ICU for hypoxic respiratory failure 8/03 - levo gtt weaned off 8/06 - breath stacking, changed MV to pressure control, changed sedation, large/dark residuals from OGT 8/07 - small LUL PTX, worsening hypotension and CXR, ARDS protocol started, and paralytics, upper EGD with small gastric ulcers.   SUBJECTIVE: Sedated and on NMB.   VITAL SIGNS: Temp:  [97 F (36.1 C)-99.7 F (37.6 C)] 97 F (36.1 C) (08/08 1000) Pulse Rate:  [91-138] 99 (08/08 1100) Resp:  [0-35] 25 (08/08 1100) BP: (89-134)/(50-80) 95/62 mmHg (08/08 1100) SpO2:  [93 %-100 %] 100 % (08/08 1100) Arterial Line BP: (109-147)/(61-78) 109/61 mmHg (08/08 1100) FiO2 (%):  [60 %-90 %] 60 % (08/08 1100) Weight:  [173 lb 8 oz (78.7 kg)] 173 lb 8 oz (78.7 kg) (08/08 0457)  VENTILATOR SETTINGS: Vent Mode:  [-] PRVC FiO2 (%):  [60 %-90 %] 60 % Set Rate:  [30 bmp-35 bmp] 35 bmp Vt Set:  [450 mL-610 mL] 450 mL PEEP:  [10 cmH20] 10 cmH20 Plateau Pressure:  [22 cmH20-30 cmH20] 27 cmH20  INTAKE / OUTPUT:  Intake/Output Summary (Last 24 hours) at 12/10/13 1121 Last data filed at 12/10/13 1100  Gross per 24 hour  Intake 5106.77 ml  Output   1245 ml  Net 3861.77 ml    PHYSICAL EXAMINATION: General:  Sedated, nmb Neuro:  Sedated, RASS -3,  nmb HEENT:  Proptosis, PERRL, EOMI, OP clear, dry mucosa, subcutaneous air (crepitus),multiple missing teeth Cardiovascular:  Tachycardic, regular rhythm, no MRG Lungs: Decreased bs bases, + rhonchi bilatoid, NTND,  Diminished BS, no HSM.  Musculoskeletal:  +pedal edema Skin:   hyperpigmented areas on face and chest   LABS:  CBC  Recent Labs Lab 12/07/13 0420 12/08/13 0545 Dec 23, 2013 0425  WBC 11.7* 10.4 11.5*  HGB 8.7* 8.2* 9.4*  HCT 27.5* 25.5* 29.9*  PLT 45* 42* 44*   BMET  Recent Labs Lab 23-Dec-2013 1300 12/23/2013 2345 12/10/13 0524  NA 147 145 147  K 3.8 3.7 4.0  CL 111 112 113*  CO2 25 23 23   BUN 20 25* 28*  CREATININE 1.09 1.33 1.51*  GLUCOSE 156* 126* 156*   Electrolytes  Recent Labs Lab 12/05/13 0020  12/05/13 0740  12/07/13 1515  2013/12/23 1300 2013-12-23 2345 12/10/13 0524  CALCIUM  --   < >  --   < > 6.9*  < > 6.5* 6.5* 6.5*  MG 2.3  --  2.2  --  1.9  --   --   --   --   PHOS 2.6  --  2.2*  --  2.3  --   --   --   --   < > = values in this interval not displayed.  Sepsis Markers  Recent Labs Lab 12-23-13 0425 2013-12-23 1300  LATICACIDVEN 2.3* 2.3*   ABG  Recent Labs Lab 12/21/2013 1300 12/14/2013 1630 12/07/2013 2231  PHART 7.243* 7.288* 7.290*  PCO2ART 52.8* 53.2* 51.3*  PO2ART 85.2 71.9* 84.0   Liver Enzymes  Recent Labs Lab 12/06/13 0458 12/08/13 0545  AST 98* 92*  ALT 12 12  ALKPHOS 135* 154*  BILITOT 0.9 0.5  ALBUMIN 1.4* 1.1*   Glucose  Recent Labs Lab 12/21/2013 1221 01/01/2014 1615 12/25/2013 2002 12/19/2013 2324 12/10/13 0359 12/10/13 0853  GLUCAP 142* 115* 102* 89 148* 135*    Imaging Dg Chest Port 1 View  12/31/2013   CLINICAL DATA:  Assess small LEFT pneumothorax. Respiratory distress.  EXAM: PORTABLE CHEST - 1 VIEW  COMPARISON:  Multiple priors. A most recent radiograph was earlier today at 0438 hr.  FINDINGS: Unchanged tubes and lines. Diffuse airspace disease. Cavitary lesion RIGHT mid chest. Unchanged  pneumomediastinum and diffuse LEFT-sided subcutaneous emphysema. LEFT apical pneumothorax not clearly visualized on this radiograph. The could be obscured or improved.  IMPRESSION: The previously questioned less than 5% pneumothorax seen earlier today is not clearly visualized on the current radiograph. There is however extensive subcutaneous air pneumomediastinum which are grossly stable. No change tubes and lines.   Electronically Signed   By: Davonna Belling M.D.   On: 12/27/2013 16:26   Dg Chest Port 1 View  12/26/2013   CLINICAL DATA:  Worsening hypoxia and tachycardia  EXAM: PORTABLE CHEST - 1 VIEW  COMPARISON:  12/08/2013  FINDINGS: Endotracheal tube ends between the carina and clavicles. Left IJ catheter tip remains at the SVC. Orogastric tube in good position, side port likely advanced in the interim.  Unchanged diffuse airspace disease with cavity in the right mid chest. Unchanged pneumomediastinum and diffuse subcutaneous emphysema. Possible small left apical pneumothorax.  These results were called by telephone at the time of interpretation on 12/06/2013 at 5:05 am to Dr. Max Fickle , who verbally acknowledged these results.  IMPRESSION: 1. Possible small left apical pneumothorax (5% or less) with certainty diminished by extensive subcutaneous gas. 2. Tubes and central line remain in good position. 3. Unchanged diffuse airspace disease with right lung cavity. Unchanged pneumomediastinum.   Electronically Signed   By: Tiburcio Pea M.D.   On: 12/19/2013 05:16   Dg Abd Portable 1v  12/12/2013   CLINICAL DATA:  Feeding tube placement  EXAM: PORTABLE ABDOMEN - 1 VIEW  COMPARISON:  12/08/2013  FINDINGS: Feeding tube tip projects over the proximal stomach. Other wires/tubes are presumably external. Bowel gas pattern nonspecific, with gaseous distention of upper normal caliber small bowel loops. There is some air within colon. The visualized portions of the lung bases show retrocardiac and mild right lung  base opacities. Multilevel degenerative changes.  IMPRESSION: Feeding tube tip is over the proximal stomach. Recommend advancement.  Nonspecific bowel gas pattern without overt obstruction.   Electronically Signed   By: Jearld Lesch M.D.   On: 12/05/2013 16:29     Antibiotics Zosyn 8/1>>8/7 Vanc 8/1>>8/3, restarted on 8/5>> Bactrim 8/5>>8/7 Micafungin 8/1>>8/7 Flagyl 8/7>>   ASSESSMENT / PLAN:  PULMONARY  A:  Acute Hypoxic Resp Failure/ARDS Sepsis\Cavitation on CT with pneumomediastium, concern for Fungal infection Small LUL PTX Hypoxia  P:   ARDS Protocol  paralytics Trend CXR and abgs, may need bicarb in future Abx per. ID consult  PTX is small, but with hypoxia will have to increase PEEP and may place chest tube - for now will monitor with serial cxr and hemodynamics  CARDIOVASCULAR CVL:8/1 l  i j cvl>> A:  Septic shock - levophed gtt weaned off 8/3 am , currently on low dose neo. Wean as tolerated Hypotension - on versed and fentanyl P:  Stress steroids in setting of chronic prednisone administration and steroid responsive lung disease, started weaning on 8/4 to 50 q6 hrs (previouly 100 q8hrs), will keep at 200 total since clinical status is worsening  RENAL A:   AKI - Likely secondary to volume depletion and hypotension, worsening Hypernatremia\Dehydration Metabolic acidosis P:   Reduce Z6X to @ 75cc\hr and monitor lytes Trend BMP Monitor UOP Scheduled 40 KCL QD Recheck lactic acid 8/8   GASTROINTESTINAL A:   Vent associated Dysphagia  ? Aspiration component Increased Gastric Residual / ABD Distention Hypertriglyceridemia  P:   Large residuals, still with no BM -  Large gastric residuals - guiac positive, GI consulted (Dr. Elnoria Howard) - small multiple gastric ulcers (related to OGT suction trauma) --> recommend to use small, flexible, weighted tube (Dubhoff) and only use for meds and flushes, no feeding through tube at this time. DO NOT PLACE OGT TO SUCTION.    PPI   HEMATOLOGIC A:   Thrombocytopenia - Mild - unknown etiology.  Noted since July 2015 P:  Trend CBC, monitor platelets, avoid toxic drugs.   INFECTIOUS A:   HCAP - in immunocompromised patient (Humira and prednisone) Per ID P:   Rx HCAP now with Cavitary lesion, high concern for fungal infection - will get bronchoscopy today with BAL ( will adjust abx\antifungal based on BAL results).  BCx2 7/31 >> ng UC 8/1 >> ng Sputum 8/5 >> K. pneumonia Abx:  zosyn, start date 8/1>>8/7,  Vanc, start date 8/5  Micafungin, start date 8/1>>8/7, Flagyl 8/7>>  ENDOCRINE A:   DM II Chronic Steroid Administration   Proptosis - TSH nml  P:   SSI Tapering solu-cortef  Lacrilube for eye protection  NEUROLOGIC A:   AMS P:   RASS goal: -3 Daily WUA Propofol stopped due to high trigs Precedex stopped due not controlling agitation  Versed gtt Fentanyl gtt PRN versed  TODAY'S SUMMARY: 58 y/o with FTT, psoriatic arthritis admitted with HCAP, AKI, resolved septic shock, RLL\RML cavitation with pneumomediastium on CT chest and LUL PTX on CXR, correcting Hyponatremia\dehydration, treating HCAP . On ARDS protocol and on NMB day 2.  RR 35 with pco2 51, ph 7.29 on 8/7.  Brett Canales Minor ACNP [facility] Pager [phone] till 3 pm If no answer page [phone] 12/10/2013, 11:33 AM   Reviewed above, examined, and documentation changes made as needed.  He remains on increased PEEP, FiO2.  Will continue paralytic.  Uncertain why acidosis persists.  Continue current Abx.  CC time 35 minutes.  Coralyn Helling, MD [facility] Pulmonary/Critical Care 12/10/2013, 12:03 PM Pager:  [phone] After 3pm call: [phone]

## 2013-12-10 NOTE — Consult Note (Signed)
[facility]    Date of Admission:  11/04/2013  Date of Consult:  12/10/2013  Reason for Consult:Septic shock with confusion an immunocompromised patient Referring Physician: Dr. Titus Mould   HPI: Lawrence Jennings is an 58 y.o. male  whom I met in June when he was admitted to [facility], first  with fevers and a rash and joint pain then with joint pain and infiltrates on Xray.  At that time we'll diagnosed him with a connective tissue disease. 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 In the interim apparently he has been diagnosed with rheumatoid arthritis and has been treated with Humira. He was apparently admitted earlier in July for failure to thrive and was given doxycycline for possible cellulitis and sent home with prednisone again for his arthritis.  He is now been admitted after being found by his sister with altered mental status and profound weakness. He is now brought to the emergency room where chest x-ray showed interstitial infiltrates and he was found to be in septic shock.  He has been treated broadly with antibiotics in the form of vancomycin Zosyn and mitral function. He is brown a non-group A beta hemolytic strep from throat culture earlier this month. He is also grown a Klebsiella pneumonia that is only resistant to ampicillin from his lungs. He has undergone bronchoscopy with BAL for bacteria fungi. Stains on BAL are without organisms fungal smears are still pending.  Past Medical History  Diagnosis Date  . Allergic reaction   . Sinus disease   . Back pain   . Arthritis   . Edema   . Hypertension   . Diabetes mellitus without complication     insulin/metformin    Past Surgical History  Procedure Laterality Date  . No past surgeries    ergies:   No Known Allergies   Medications: I have reviewed patients  current medications as documented in Epic Anti-infectives   Start     Dose/Rate Route Frequency Ordered Stop   12/10/13 1100  metroNIDAZOLE (FLAGYL) IVPB 500 mg     500 mg 100 mL/hr over 60 Minutes Intravenous Every 8 hours 12/10/13 1048     12/07/13 1300  sulfamethoxazole-trimethoprim (BACTRIM) 360 mg in dextrose 5 % 500 mL IVPB  Status:  Discontinued     360 mg 348.3 mL/hr over 90 Minutes Intravenous 3 times per day 12/07/13 1220 12/08/13 1321   12/07/13 1230  vancomycin (VANCOCIN) IVPB 1000 mg/200 mL premix     1,000 mg 200 mL/hr over 60 Minutes Intravenous Every 12 hours 12/07/13 1220     12/03/13 1200  vancomycin (VANCOCIN) IVPB 750 mg/150 ml premix  Status:  Discontinued     750 mg 150 mL/hr over 60 Minutes Intravenous Every 12 hours 12/03/13 0122 12/04/13 1617   12/03/13 0500  micafungin (MYCAMINE) 100 mg in sodium chloride 0.9 % 100 mL IVPB     100 mg 100 mL/hr over 1 Hours Intravenous Daily 12/03/13 0459     12/03/13 0400  piperacillin-tazobactam (ZOSYN) IVPB 3.375 g  Status:  Discontinued     3.375 g 12.5 mL/hr over 240 Minutes Intravenous Every 8 hours 12/03/13 0122 12/10/13 1048   11/26/2013 2145  piperacillin-tazobactam (ZOSYN) IVPB 3.375 g     3.375 g 100 mL/hr over 30 Minutes Intravenous  Once 11/25/2013 2142 11/03/2013 2342  11/27/2013 2145  vancomycin (VANCOCIN) IVPB 1000 mg/200 mL premix     1,000 mg 200 mL/hr over 60 Minutes Intravenous  Once 11/17/2013 2142 12/03/13 0202      Social History:  reports that he quit smoking about 2 months ago. His smoking use included Cigarettes. He has a 5 pack-year smoking history. He has never used smokeless tobacco. He reports that he does not drink alcohol or use illicit drugs.  History reviewed. No pertinent family history.  As in HPI and primary teams notes otherwise 12 point review of systems is negative  Blood pressure 95/66, pulse 103, temperature 97 F (36.1 C), temperature source Core (Comment), resp. rate 24, height 5' 11.65"  (1.82 m), weight 173 lb 8 oz (78.7 kg), SpO2 100.00%. General: intubated paralyzed and on a ventilator  HEENT: anicteric sclera,  CVS tachycardic  rate, normal r,  no murmur rubs or gallops Chest:  fairly clear to auscultation anteriorly Abdomen: soft nontender, nondistended, normal bowel sounds, Skin: Still with some exfoliative lesions present.  Neuro: Paralyzed  Results for orders placed during the hospital encounter of 11/13/2013 (from the past 48 hour(s))  GLUCOSE, CAPILLARY     Status: Abnormal   Collection Time    12/08/13 11:38 AM      Result Value Ref Range   Glucose-Capillary 206 (*) 70 - 99 mg/dL  POCT I-STAT 3, ART BLOOD GAS (G3+)     Status: Abnormal   Collection Time    12/08/13  1:51 PM      Result Value Ref Range   pH, Arterial 7.464 (*) 7.350 - 7.450   pCO2 arterial 35.9  35.0 - 45.0 mmHg   pO2, Arterial 159.0 (*) 80.0 - 100.0 mmHg   Bicarbonate 25.7 (*) 20.0 - 24.0 mEq/L   TCO2 27  0 - 100 mmol/L   O2 Saturation 100.0     Acid-Base Excess 2.0  0.0 - 2.0 mmol/L   Patient temperature 99.4 F     Collection site RADIAL, ALLEN'S TEST ACCEPTABLE     Drawn by Operator     Sample type ARTERIAL    BASIC METABOLIC PANEL     Status: Abnormal   Collection Time    12/08/13  2:00 PM      Result Value Ref Range   Sodium 150 (*) 137 - 147 mEq/L   Potassium 3.4 (*) 3.7 - 5.3 mEq/L   Chloride 113 (*) 96 - 112 mEq/L   CO2 24  19 - 32 mEq/L   Glucose, Bld 185 (*) 70 - 99 mg/dL   BUN 18  6 - 23 mg/dL   Creatinine, Ser 0.87  0.50 - 1.35 mg/dL   Calcium 6.8 (*) 8.4 - 10.5 mg/dL   GFR calc non Af Amer >90  >90 mL/min   GFR calc Af Amer >90  >90 mL/min   Comment: (NOTE)     The eGFR has been calculated using the CKD EPI equation.     This calculation has not been validated in all clinical situations.     eGFR's persistently <90 mL/min signify possible Chronic Kidney     Disease.   Anion gap 13  5 - 15  OCCULT BLOOD GASTRIC / DUODENUM (SPECIMEN CUP)     Status: Abnormal    Collection Time    12/08/13  3:19 PM      Result Value Ref Range   pH, Gastric NOT DONE     Occult Blood, Gastric POSITIVE (*) NEGATIVE  GLUCOSE, CAPILLARY  Status: Abnormal   Collection Time    12/08/13  3:55 PM      Result Value Ref Range   Glucose-Capillary 162 (*) 70 - 99 mg/dL  GLUCOSE, CAPILLARY     Status: None   Collection Time    12/08/13  8:25 PM      Result Value Ref Range   Glucose-Capillary 79  70 - 99 mg/dL  BASIC METABOLIC PANEL     Status: Abnormal   Collection Time    12/08/13 11:55 PM      Result Value Ref Range   Sodium 148 (*) 137 - 147 mEq/L   Potassium 3.6 (*) 3.7 - 5.3 mEq/L   Chloride 112  96 - 112 mEq/L   CO2 26  19 - 32 mEq/L   Glucose, Bld 50 (*) 70 - 99 mg/dL   BUN 19  6 - 23 mg/dL   Creatinine, Ser 0.96  0.50 - 1.35 mg/dL   Calcium 6.9 (*) 8.4 - 10.5 mg/dL   GFR calc non Af Amer >90  >90 mL/min   GFR calc Af Amer >90  >90 mL/min   Comment: (NOTE)     The eGFR has been calculated using the CKD EPI equation.     This calculation has not been validated in all clinical situations.     eGFR's persistently <90 mL/min signify possible Chronic Kidney     Disease.   Anion gap 10  5 - 15  GLUCOSE, CAPILLARY     Status: Abnormal   Collection Time    12/19/2013 12:21 AM      Result Value Ref Range   Glucose-Capillary 60 (*) 70 - 99 mg/dL  GLUCOSE, CAPILLARY     Status: Abnormal   Collection Time    12/26/2013 12:42 AM      Result Value Ref Range   Glucose-Capillary 123 (*) 70 - 99 mg/dL  BLOOD GAS, ARTERIAL     Status: Abnormal   Collection Time    12/08/2013  3:48 AM      Result Value Ref Range   FIO2 0.70     Delivery systems VENTILATOR     Mode PRESSURE CONTROL     Rate 15     Peep/cpap 5.0     pH, Arterial 7.432  7.350 - 7.450   pCO2 arterial 40.5  35.0 - 45.0 mmHg   pO2, Arterial 56.4 (*) 80.0 - 100.0 mmHg   Bicarbonate 26.5 (*) 20.0 - 24.0 mEq/L   TCO2 27.7  0 - 100 mmol/L   Acid-Base Excess 2.5 (*) 0.0 - 2.0 mmol/L   O2 Saturation 87.9      Patient temperature 98.6     Collection site RIGHT RADIAL     Drawn by [id number]     Sample type ARTERIAL DRAW     Allens test (pass/fail) PASS  PASS  GLUCOSE, CAPILLARY     Status: None   Collection Time    12/07/2013  3:53 AM      Result Value Ref Range   Glucose-Capillary 96  70 - 99 mg/dL  BASIC METABOLIC PANEL     Status: Abnormal   Collection Time    12/19/2013  4:25 AM      Result Value Ref Range   Sodium 146  137 - 147 mEq/L   Potassium 3.9  3.7 - 5.3 mEq/L   Chloride 110  96 - 112 mEq/L   CO2 26  19 - 32 mEq/L   Glucose, Bld 111 (*)  70 - 99 mg/dL   BUN 19  6 - 23 mg/dL   Creatinine, Ser 1.00  0.50 - 1.35 mg/dL   Calcium 6.8 (*) 8.4 - 10.5 mg/dL   GFR calc non Af Amer 82 (*) >90 mL/min   GFR calc Af Amer >90  >90 mL/min   Comment: (NOTE)     The eGFR has been calculated using the CKD EPI equation.     This calculation has not been validated in all clinical situations.     eGFR's persistently <90 mL/min signify possible Chronic Kidney     Disease.   Anion gap 10  5 - 15  CBC WITH DIFFERENTIAL     Status: Abnormal   Collection Time    01/01/2014  4:25 AM      Result Value Ref Range   WBC 11.5 (*) 4.0 - 10.5 K/uL   RBC 3.42 (*) 4.22 - 5.81 MIL/uL   Hemoglobin 9.4 (*) 13.0 - 17.0 g/dL   HCT 29.9 (*) 39.0 - 52.0 %   MCV 87.4  78.0 - 100.0 fL   MCH 27.5  26.0 - 34.0 pg   MCHC 31.4  30.0 - 36.0 g/dL   RDW 19.0 (*) 11.5 - 15.5 %   Platelets 44 (*) 150 - 400 K/uL   Comment: REPEATED TO VERIFY     CONSISTENT WITH PREVIOUS RESULT   Neutrophils Relative % 95 (*) 43 - 77 %   Neutro Abs 11.0 (*) 1.7 - 7.7 K/uL   Lymphocytes Relative 3 (*) 12 - 46 %   Lymphs Abs 0.3 (*) 0.7 - 4.0 K/uL   Monocytes Relative 2 (*) 3 - 12 %   Monocytes Absolute 0.2  0.1 - 1.0 K/uL   Eosinophils Relative 0  0 - 5 %   Eosinophils Absolute 0.0  0.0 - 0.7 K/uL   Basophils Relative 0  0 - 1 %   Basophils Absolute 0.0  0.0 - 0.1 K/uL  LACTIC ACID, PLASMA     Status: Abnormal   Collection Time     12/08/2013  4:25 AM      Result Value Ref Range   Lactic Acid, Venous 2.3 (*) 0.5 - 2.2 mmol/L  GLUCOSE, CAPILLARY     Status: Abnormal   Collection Time    12/26/2013  8:10 AM      Result Value Ref Range   Glucose-Capillary 144 (*) 70 - 99 mg/dL  BLOOD GAS, ARTERIAL     Status: Abnormal   Collection Time    12/05/2013  8:40 AM      Result Value Ref Range   FIO2 0.80     Delivery systems VENTILATOR     Mode PRESSURE CONTROL     Rate 15     Peep/cpap 5.0     pH, Arterial 7.398  7.350 - 7.450   pCO2 arterial 42.5  35.0 - 45.0 mmHg   pO2, Arterial 54.1 (*) 80.0 - 100.0 mmHg   Bicarbonate 25.5 (*) 20.0 - 24.0 mEq/L   TCO2 26.8  0 - 100 mmol/L   Acid-Base Excess 1.3  0.0 - 2.0 mmol/L   O2 Saturation 84.5     Patient temperature 99.3     Collection site RIGHT RADIAL     Drawn by [id number]     Sample type ARTERIAL DRAW     Allens test (pass/fail) PASS  PASS  CULTURE, BAL-QUANTITATIVE     Status: None   Collection Time    12/24/2013 12:17 PM  Result Value Ref Range   Specimen Description BRONCHIAL ALVEOLAR LAVAGE     Special Requests Immunocompromised     Gram Stain       Value: FEW WBC PRESENT,BOTH PMN AND MONONUCLEAR     NO SQUAMOUS EPITHELIAL CELLS SEEN     NO ORGANISMS SEEN     Performed at [facility] Count PENDING     Culture PENDING     Report Status PENDING    BODY FLUID CELL COUNT WITH DIFFERENTIAL     Status: Abnormal   Collection Time    12/13/2013 12:20 PM      Result Value Ref Range   Fluid Type-FCT BRONCHIAL ALVEOLAR LAVAGE     Color, Fluid STRAW (*) YELLOW   Appearance, Fluid TURBID (*) CLEAR   WBC, Fluid 540  0 - 1000 cu mm   Neutrophil Count, Fluid 91 (*) 0 - 25 %   Lymphs, Fluid 4     Monocyte-Macrophage-Serous Fluid 5 (*) 50 - 90 %   Eos, Fluid NONE SEEN     Other Cells, Fluid DETERIOTING WHITE CELLS     Comment: COUNT MAY BE AFFECTED DUE TO PARTICULATE MATTER.  GLUCOSE, CAPILLARY     Status: Abnormal   Collection Time    12/31/2013 12:21 PM       Result Value Ref Range   Glucose-Capillary 142 (*) 70 - 99 mg/dL  BLOOD GAS, ARTERIAL     Status: Abnormal   Collection Time    12/27/2013  1:00 PM      Result Value Ref Range   FIO2 0.90     Delivery systems VENTILATOR     Mode PRESSURE REGULATED VOLUME CONTROL     VT 460     Rate 30     Peep/cpap 10.0     pH, Arterial 7.243 (*) 7.350 - 7.450   pCO2 arterial 52.8 (*) 35.0 - 45.0 mmHg   pO2, Arterial 85.2  80.0 - 100.0 mmHg   Bicarbonate 22.0  20.0 - 24.0 mEq/L   TCO2 23.6  0 - 100 mmol/L   Acid-base deficit 4.2 (*) 0.0 - 2.0 mmol/L   O2 Saturation 94.1     Patient temperature 98.6     Collection site A-LINE     Drawn by [id number]     Sample type ARTERIAL DRAW     Allens test (pass/fail) PASS  PASS  BASIC METABOLIC PANEL     Status: Abnormal   Collection Time    12/05/2013  1:00 PM      Result Value Ref Range   Sodium 147  137 - 147 mEq/L   Potassium 3.8  3.7 - 5.3 mEq/L   Chloride 111  96 - 112 mEq/L   CO2 25  19 - 32 mEq/L   Glucose, Bld 156 (*) 70 - 99 mg/dL   BUN 20  6 - 23 mg/dL   Creatinine, Ser 1.09  0.50 - 1.35 mg/dL   Calcium 6.5 (*) 8.4 - 10.5 mg/dL   GFR calc non Af Amer 74 (*) >90 mL/min   GFR calc Af Amer 86 (*) >90 mL/min   Comment: (NOTE)     The eGFR has been calculated using the CKD EPI equation.     This calculation has not been validated in all clinical situations.     eGFR's persistently <90 mL/min signify possible Chronic Kidney     Disease.   Anion gap 11  5 - 15  LACTIC ACID,  PLASMA     Status: Abnormal   Collection Time    12/29/2013  1:00 PM      Result Value Ref Range   Lactic Acid, Venous 2.3 (*) 0.5 - 2.2 mmol/L  GLUCOSE, CAPILLARY     Status: Abnormal   Collection Time    12/14/2013  4:15 PM      Result Value Ref Range   Glucose-Capillary 115 (*) 70 - 99 mg/dL  BLOOD GAS, ARTERIAL     Status: Abnormal   Collection Time    12/06/2013  4:30 PM      Result Value Ref Range   FIO2 0.80     Delivery systems VENTILATOR     Mode PRESSURE  REGULATED VOLUME CONTROL     VT 530     Rate 30     Peep/cpap 10.0     pH, Arterial 7.288 (*) 7.350 - 7.450   pCO2 arterial 53.2 (*) 35.0 - 45.0 mmHg   pO2, Arterial 71.9 (*) 80.0 - 100.0 mmHg   Bicarbonate 24.6 (*) 20.0 - 24.0 mEq/L   TCO2 26.3  0 - 100 mmol/L   Acid-base deficit 1.1  0.0 - 2.0 mmol/L   O2 Saturation 91.7     Patient temperature 98.6     Collection site A-LINE     Sample type ARTERIAL DRAW     Allens test (pass/fail) PASS  PASS  GLUCOSE, CAPILLARY     Status: Abnormal   Collection Time    12/07/2013  8:02 PM      Result Value Ref Range   Glucose-Capillary 102 (*) 70 - 99 mg/dL  POCT I-STAT 3, ART BLOOD GAS (G3+)     Status: Abnormal   Collection Time    12/27/2013 10:31 PM      Result Value Ref Range   pH, Arterial 7.290 (*) 7.350 - 7.450   pCO2 arterial 51.3 (*) 35.0 - 45.0 mmHg   pO2, Arterial 84.0  80.0 - 100.0 mmHg   Bicarbonate 24.7 (*) 20.0 - 24.0 mEq/L   TCO2 26  0 - 100 mmol/L   O2 Saturation 95.0     Acid-base deficit 2.0  0.0 - 2.0 mmol/L   Patient temperature 98.7 F     Collection site ARTERIAL LINE     Drawn by RT     Sample type ARTERIAL    GLUCOSE, CAPILLARY     Status: None   Collection Time    12/06/2013 11:24 PM      Result Value Ref Range   Glucose-Capillary 89  70 - 99 mg/dL  BASIC METABOLIC PANEL     Status: Abnormal   Collection Time    12/23/2013 11:45 PM      Result Value Ref Range   Sodium 145  137 - 147 mEq/L   Potassium 3.7  3.7 - 5.3 mEq/L   Chloride 112  96 - 112 mEq/L   CO2 23  19 - 32 mEq/L   Glucose, Bld 126 (*) 70 - 99 mg/dL   BUN 25 (*) 6 - 23 mg/dL   Creatinine, Ser 1.33  0.50 - 1.35 mg/dL   Calcium 6.5 (*) 8.4 - 10.5 mg/dL   GFR calc non Af Amer 58 (*) >90 mL/min   GFR calc Af Amer 68 (*) >90 mL/min   Comment: (NOTE)     The eGFR has been calculated using the CKD EPI equation.     This calculation has not been validated in all clinical situations.  eGFR's persistently <90 mL/min signify possible Chronic Kidney      Disease.   Anion gap 10  5 - 15  GLUCOSE, CAPILLARY     Status: Abnormal   Collection Time    12/10/13  3:59 AM      Result Value Ref Range   Glucose-Capillary 148 (*) 70 - 99 mg/dL  BASIC METABOLIC PANEL     Status: Abnormal   Collection Time    12/10/13  5:24 AM      Result Value Ref Range   Sodium 147  137 - 147 mEq/L   Potassium 4.0  3.7 - 5.3 mEq/L   Chloride 113 (*) 96 - 112 mEq/L   CO2 23  19 - 32 mEq/L   Glucose, Bld 156 (*) 70 - 99 mg/dL   BUN 28 (*) 6 - 23 mg/dL   Creatinine, Ser 1.51 (*) 0.50 - 1.35 mg/dL   Calcium 6.5 (*) 8.4 - 10.5 mg/dL   GFR calc non Af Amer 50 (*) >90 mL/min   GFR calc Af Amer 58 (*) >90 mL/min   Comment: (NOTE)     The eGFR has been calculated using the CKD EPI equation.     This calculation has not been validated in all clinical situations.     eGFR's persistently <90 mL/min signify possible Chronic Kidney     Disease.   Anion gap 11  5 - 15  GLUCOSE, CAPILLARY     Status: Abnormal   Collection Time    12/10/13  8:53 AM      Result Value Ref Range   Glucose-Capillary 135 (*) 70 - 99 mg/dL   _0 (sdes,specrequest,cult,reptstatus)   ) Recent Results (from the past 720 hour(s))  CULTURE, BLOOD (ROUTINE X 2)     Status: None   Collection Time    12/01/2013  9:39 PM      Result Value Ref Range Status   Specimen Description BLOOD LEFT ARM   Final   Special Requests BOTTLES DRAWN AEROBIC ONLY 2CC   Final   Culture  Setup Time     Final   Value: 12/03/2013 03:43     Performed at [facility]   Culture     Final   Value: NO GROWTH 5 DAYS     Performed at [facility]   Report Status 12/08/2013 FINAL   Final  CULTURE, BLOOD (ROUTINE X 2)     Status: None   Collection Time    11/28/2013  9:57 PM      Result Value Ref Range Status   Specimen Description BLOOD RIGHT FEMORAL ARTERY   Final   Special Requests BOTTLES DRAWN AEROBIC AND ANAEROBIC [facility] [address] EACH   Final   Culture  Setup Time     Final   Value: 12/03/2013  03:43     Performed at [place]     Final   Value: NO GROWTH 5 DAYS     Performed at [facility]   Report Status 12/14/2013 FINAL   Final  URINE CULTURE     Status: None   Collection Time    12/03/13  2:07 AM      Result Value Ref Range Status   Specimen Description URINE, RANDOM   Final   Special Requests NONE   Final   Culture  Setup Time     Final   Value: 12/03/2013 12:35     Performed at [facility]  Count     Final   Value: NO GROWTH     Performed at [facility]   Culture     Final   Value: NO GROWTH     Performed at [facility]   Report Status 12/04/2013 FINAL   Final  RAPID STREP SCREEN     Status: None   Collection Time    12/03/13  2:08 AM      Result Value Ref Range Status   Streptococcus, Group A Screen (Direct) NEGATIVE  NEGATIVE Final   Comment: (NOTE)     A Rapid Antigen test may result negative if the antigen level in the     sample is below the detection level of this test. The FDA has not     cleared this test as a stand-alone test therefore the rapid antigen     negative result has reflexed to a Group A Strep culture.  CULTURE, GROUP A STREP     Status: None   Collection Time    12/03/13  2:08 AM      Result Value Ref Range Status   Specimen Description THROAT   Final   Special Requests NONE   Final   Culture     Final   Value: STREPTOCOCCUS,BETA HEMOLYTIC NOT GROUP A     Performed at [facility]   Report Status 12/05/2013 FINAL   Final  MRSA PCR SCREENING     Status: None   Collection Time    12/03/13  2:38 AM      Result Value Ref Range Status   MRSA by PCR NEGATIVE  NEGATIVE Final   Comment:            The GeneXpert MRSA Assay (FDA     approved for NASAL specimens     only), is one component of a     comprehensive MRSA colonization     surveillance program. It is not     intended to diagnose MRSA     infection nor to guide or     monitor treatment for     MRSA  infections.  CULTURE, RESPIRATORY (NON-EXPECTORATED)     Status: None   Collection Time    12/07/13 12:31 PM      Result Value Ref Range Status   Specimen Description TRACHEAL ASPIRATE   Final   Special Requests Immunocompromised   Final   Gram Stain     Final   Value: FEW WBC PRESENT,BOTH PMN AND MONONUCLEAR     RARE SQUAMOUS EPITHELIAL CELLS PRESENT     NO ORGANISMS SEEN     Performed at [facility]   Culture     Final   Value: FEW KLEBSIELLA PNEUMONIAE     Performed at [facility]   Report Status 12/26/2013 FINAL   Final   Organism ID, Bacteria KLEBSIELLA PNEUMONIAE   Final  PNEUMOCYSTIS JIROVECI SMEAR BY DFA     Status: None   Collection Time    12/07/13 12:38 PM      Result Value Ref Range Status   Specimen Source-PJSRC ENDOTRACHEAL   Final   Pneumocystis jiroveci Ag NEGATIVE   Final   Comment: Performed at [place], BAL-QUANTITATIVE     Status: None   Collection Time    12/07/2013 12:17 PM      Result Value Ref Range Status   Specimen Description BRONCHIAL ALVEOLAR LAVAGE   Final   Special  Requests Immunocompromised   Final   Gram Stain     Final   Value: FEW WBC PRESENT,BOTH PMN AND MONONUCLEAR     NO SQUAMOUS EPITHELIAL CELLS SEEN     NO ORGANISMS SEEN     Performed at [facility] Count PENDING   Incomplete   Culture PENDING   Incomplete   Report Status PENDING   Incomplete     Impression/Recommendation  Active Problems:   Septic shock   Ignacio Lowder is a 58 y.o. male with  recently diagnosed connective tissue disease in June. At that time he presented with fevers and extensive exfoliative rash and a polyarticular arthritis. He at that time responded to corticosteroid therapy then rebounded with down titration of his steroids and came back in with worsening polyarticular arthritis and interstitial infiltrates on chest x-ray all of which responded to corticosteroids again. He was to have been seen  by Dr. Amil Amen and it seems like I'm going from the chart that he has now been treated with Humira. Apparently he had failure to thrive and was admitted to try earlier in July and got discharged on doxycycline. He was found at home confused and profoundly weak. He was admitted to the hospital and found to have interstitial infiltrates and now multifocal disease and evidence of septic shock. He's been Speck placed on broad-spectrum antibiotics in the form of vancomycin Zosyn and micafungin has been receiving cortisone.  #1 Septic shock: Lungs continue to be the most likely suspect. I worried given his obtundation upon presentation whether or not we need to have more optimal central nervous system penetration.  It is also possible he could have infection elsewhere and that his lungs could represent an ARDS type response.  I will change his antimicrobial antibiotic therapy to vancomycin cefepime and Flagyl to more optimize central nervous Bennett system penetration while providing broad-spectrum coverage for his lungs and abdomen.  All discontinue his micafungin and start him on voriconazole for broader antifungal therapy. It would be unusual for an invasive mold however to present with such a septic type picture.  Previously he did respond quite well to corticosteroids and had lung disease that was completely steroid responsive. He is currently still on hydrocortisone.  We may if he does not improve sufficiently to consider an open lung biopsy  #2 Screening: He was HIV-negative ice cream in June but is at risk for acquiring HIV infection we'll screen him again during this admission.   12/10/2013, 10:50 AM   Thank you so much for this interesting consult  [facility] [place] [phone] (pager) [phone] (office) 12/10/2013, 10:50 AM  Rhina [place] 12/10/2013, 10:50 AM

## 2013-12-11 ENCOUNTER — Inpatient Hospital Stay (HOSPITAL_COMMUNITY): Payer: Medicaid Other

## 2013-12-11 DIAGNOSIS — A4159 Other Gram-negative sepsis: Secondary | ICD-10-CM

## 2013-12-11 LAB — GLUCOSE, CAPILLARY
GLUCOSE-CAPILLARY: 115 mg/dL — AB (ref 70–99)
GLUCOSE-CAPILLARY: 154 mg/dL — AB (ref 70–99)
GLUCOSE-CAPILLARY: 158 mg/dL — AB (ref 70–99)
Glucose-Capillary: 119 mg/dL — ABNORMAL HIGH (ref 70–99)
Glucose-Capillary: 161 mg/dL — ABNORMAL HIGH (ref 70–99)
Glucose-Capillary: 142 mg/dL — ABNORMAL HIGH (ref 70–99)
Glucose-Capillary: 101 mg/dL — ABNORMAL HIGH (ref 70–99)
Glucose-Capillary: 71 mg/dL (ref 70–99)

## 2013-12-11 LAB — CBC
HCT: 22.9 % — ABNORMAL LOW (ref 39.0–52.0)
Hemoglobin: 7 g/dL — ABNORMAL LOW (ref 13.0–17.0)
MCH: 27.1 pg (ref 26.0–34.0)
MCHC: 30.6 g/dL (ref 30.0–36.0)
MCV: 88.8 fL (ref 78.0–100.0)
Platelets: 50 10*3/uL — ABNORMAL LOW (ref 150–400)
RBC: 2.58 MIL/uL — AB (ref 4.22–5.81)
RDW: 19.2 % — AB (ref 11.5–15.5)
WBC: 9.6 10*3/uL (ref 4.0–10.5)

## 2013-12-11 LAB — BASIC METABOLIC PANEL
Anion gap: 12 (ref 5–15)
BUN: 37 mg/dL — ABNORMAL HIGH (ref 6–23)
CALCIUM: 6.4 mg/dL — AB (ref 8.4–10.5)
CO2: 23 mEq/L (ref 19–32)
Chloride: 109 mEq/L (ref 96–112)
Creatinine, Ser: 1.97 mg/dL — ABNORMAL HIGH (ref 0.50–1.35)
GFR, EST AFRICAN AMERICAN: 42 mL/min — AB (ref >90)
GFR, EST NON AFRICAN AMERICAN: 36 mL/min — AB (ref >90)
GLUCOSE: 170 mg/dL — AB (ref 70–99)
POTASSIUM: 3.8 meq/L (ref 3.7–5.3)
Sodium: 144 mEq/L (ref 137–147)

## 2013-12-11 LAB — BLOOD GAS, ARTERIAL
Acid-base deficit: 3.6 mmol/L — ABNORMAL HIGH (ref 0.0–2.0)
BICARBONATE: 22.3 meq/L (ref 20.0–24.0)
DRAWN BY: 41877
FIO2: 50 %
LHR: 30 {breaths}/min
MECHVT: 450 mL
O2 Saturation: 87.6 %
PCO2 ART: 48.4 mmHg — AB (ref 35.0–45.0)
PEEP: 8 cmH2O
PO2 ART: 54.9 mmHg — AB (ref 80.0–100.0)
Patient temperature: 97.4
TCO2: 23.8 mmol/L (ref 0–100)
pH, Arterial: 7.281 — ABNORMAL LOW (ref 7.350–7.450)

## 2013-12-11 LAB — VANCOMYCIN, TROUGH: VANCOMYCIN TR: 50.7 ug/mL — AB (ref 10.0–20.0)

## 2013-12-11 LAB — CULTURE, BAL-QUANTITATIVE W GRAM STAIN

## 2013-12-11 LAB — CULTURE, BAL-QUANTITATIVE

## 2013-12-11 LAB — HIV ANTIBODY (ROUTINE TESTING W REFLEX): HIV 1&2 Ab, 4th Generation: NONREACTIVE

## 2013-12-11 MED ORDER — VORICONAZOLE 50 MG PO TABS
300.0000 mg | ORAL_TABLET | Freq: Two times a day (BID) | ORAL | Status: DC
  Administered 2013-12-11 – 2013-12-14 (×7): 300 mg
  Filled 2013-12-11 (×8): qty 2

## 2013-12-11 MED ORDER — SODIUM CHLORIDE 0.9 % IV SOLN
1.0000 g | Freq: Once | INTRAVENOUS | Status: AC
  Administered 2013-12-11: 1 g via INTRAVENOUS
  Filled 2013-12-11: qty 10

## 2013-12-11 MED ORDER — MUPIROCIN 2 % EX OINT: 1.0000 "application " | TOPICAL_OINTMENT | Freq: Two times a day (BID) | CUTANEOUS | Status: DC

## 2013-12-11 MED ORDER — DEXTROSE 5 % IV SOLN
1.0000 g | INTRAVENOUS | Status: DC
  Administered 2013-12-12 – 2013-12-16 (×5): 1 g via INTRAVENOUS
  Filled 2013-12-11 (×5): qty 1

## 2013-12-11 MED ORDER — CHLORHEXIDINE GLUCONATE CLOTH 2 % EX PADS: 6.0000 | MEDICATED_PAD | Freq: Every day | CUTANEOUS | Status: DC

## 2013-12-11 NOTE — Progress Notes (Signed)
[facility] Day # 2 cefepime Day # 2 flagyl Day # 2 voriconazole  Day # 10 vancomycin   10 d zosyn 6 days of micafungin  Subjective: Intubated and paralyzed  Antibiotics:  Anti-infectives   Start     Dose/Rate Route Frequency Ordered Stop   12/12/13 1000  ceFEPIme (MAXIPIME) 1 g in dextrose 5 % 50 mL IVPB     1 g 100 mL/hr over 30 Minutes Intravenous Every 24 hours 12/11/13 1314     12/11/13 1400  voriconazole (VFEND) 310 mg in sodium chloride 0.9 % 100 mL IVPB  Status:  Discontinued     4 mg/kg  78.7 kg 65.5 mL/hr over 120 Minutes Intravenous Every 12 hours 12/10/13 1210 12/11/13 1141   12/11/13 1300  voriconazole (VFEND) tablet 300 mg     300 mg Per Tube Every 12 hours 12/11/13 1148     12/10/13 1400  voriconazole (VFEND) 470 mg in sodium chloride 0.9 % 100 mL IVPB     6 mg/kg  78.7 kg 73.5 mL/hr over 120 Minutes Intravenous Every 12 hours 12/10/13 1210 12/11/13 0455   12/10/13 1300  ceFEPIme (MAXIPIME) 1 g in dextrose 5 % 50 mL IVPB  Status:  Discontinued     1 g 100 mL/hr over 30 Minutes Intravenous Every 12 hours 12/10/13 1210 12/11/13 1314   12/10/13 1100  metroNIDAZOLE (FLAGYL) IVPB 500 mg     500 mg 100 mL/hr over 60 Minutes Intravenous Every 8 hours 12/10/13 1048     12/07/13 1300  sulfamethoxazole-trimethoprim (BACTRIM) 360 mg in dextrose 5 % 500 mL IVPB  Status:  Discontinued     360 mg 348.3 mL/hr over 90 Minutes Intravenous 3 times per day 12/07/13 1220 12/08/13 1321   12/07/13 1230  vancomycin (VANCOCIN) IVPB 1000 mg/200 mL premix  Status:  Discontinued     1,000 mg 200 mL/hr over 60 Minutes Intravenous Every 12 hours 12/07/13 1220 12/11/13 1014   12/03/13 1200  vancomycin (VANCOCIN) IVPB 750 mg/150 ml premix  Status:  Discontinued     750 mg 150 mL/hr over 60 Minutes Intravenous Every 12 hours 12/03/13 0122 12/04/13 1617   12/03/13 0500  micafungin (MYCAMINE) 100 mg in sodium chloride 0.9 % 100 mL IVPB  Status:  Discontinued       100 mg 100 mL/hr over 1 Hours Intravenous Daily 12/03/13 0459 12/10/13 1103   12/03/13 0400  piperacillin-tazobactam (ZOSYN) IVPB 3.375 g  Status:  Discontinued     3.375 g 12.5 mL/hr over 240 Minutes Intravenous Every 8 hours 12/03/13 0122 12/10/13 1048   12/13/2013 2145  piperacillin-tazobactam (ZOSYN) IVPB 3.375 g     3.375 g 100 mL/hr over 30 Minutes Intravenous  Once December 13, 2013 2142 Dec 13, 2013 2342   2013/12/13 2145  vancomycin (VANCOCIN) IVPB 1000 mg/200 mL premix     1,000 mg 200 mL/hr over 60 Minutes Intravenous  Once December 13, 2013 2142 12/03/13 0202      Medications: Scheduled Meds: . antiseptic oral rinse  7 mL Mouth Rinse QID  . artificial tears   Both Eyes 3 times per day  . [START ON 12/12/2013] ceFEPime (MAXIPIME) IV  1 g Intravenous Q24H  . chlorhexidine  15 mL Mouth Rinse BID  . famotidine (PEPCID) IV  20 mg Intravenous Q12H  . hydrocortisone sod succinate (SOLU-CORTEF) inj  50 mg Intravenous Q6H  . insulin aspart  2-6 Units Subcutaneous 6 times per day  . ipratropium-albuterol  3 mL Nebulization Q6H  .  metronidazole  500 mg Intravenous Q8H  . voriconazole  300 mg Per Tube Q12H   Continuous Infusions: . dextrose 75 mL/hr at 12/11/13 1649  . fentaNYL infusion INTRAVENOUS 100 mcg/hr (12/11/13 2232)  . midazolam (VERSED) infusion 2 mg/hr (12/10/13 2300)  . phenylephrine (NEO-SYNEPHRINE) Adult infusion Stopped (12/10/13 1128)   PRN Meds:.sodium chloride, fentaNYL, fentaNYL, midazolam, sennosides, sennosides    Objective: Weight change:   Intake/Output Summary (Last 24 hours) at 12/11/13 2244 Last data filed at 12/11/13 2005  Gross per 24 hour  Intake 2386.8 ml  Output    840 ml  Net 1546.8 ml   Blood pressure 94/57, pulse 110, temperature 98.5 F (36.9 C), temperature source Oral, resp. rate 33, height 5' 11.65" (1.82 m), weight 173 lb 8 oz (78.7 kg), SpO2 95.00%. Temp:  [96.5 F (35.8 C)-98.5 F (36.9 C)] 98.5 F (36.9 C) (08/09 2008) Pulse Rate:  [95-110] 110  (08/09 2000) Resp:  [30-33] 33 (08/09 1512) BP: (82-97)/(45-63) 94/57 mmHg (08/09 2000) SpO2:  [92 %-100 %] 95 % (08/09 2000) Arterial Line BP: (87-114)/(45-61) 114/60 mmHg (08/09 2000) FiO2 (%):  [50 %] 50 % (08/09 2000)  Physical Exam: General: intubated and paralyzed HEENT: anicteric sclera, CVS regular rate, normal r,  no murmur rubs or gallops Chest: rhochi CV: tachycardic, no mgr Abdomen: soft  nondistended,  ExtremitiesSkin: his exfoliative rash looks much improved vs when I saw him in June Neuro: paralyzed  CBC:  Recent Labs Lab 12/06/13 0458 12/07/13 0420 12/08/13 0545 2013-12-31 0425 12/11/13 0107  HGB 8.8* 8.7* 8.2* 9.4* 7.0*  HCT 28.1* 27.5* 25.5* 29.9* 22.9*  PLT 62* 45* 42* 44* 50*     BMET  Recent Labs  12/10/13 0524 12/11/13 0107  NA 147 144  K 4.0 3.8  CL 113* 109  CO2 23 23  GLUCOSE 156* 170*  BUN 28* 37*  CREATININE 1.51* 1.97*  CALCIUM 6.5* 6.4*     Liver Panel  No results found for this basename: PROT, ALBUMIN, AST, ALT, ALKPHOS, BILITOT, BILIDIR, IBILI,  in the last 72 hours     Sedimentation Rate No results found for this basename: ESRSEDRATE,  in the last 72 hours C-Reactive Protein No results found for this basename: CRP,  in the last 72 hours  Micro Results: Recent Results (from the past 720 hour(s))  CULTURE, BLOOD (ROUTINE X 2)     Status: None   Collection Time    11/07/2013  9:39 PM      Result Value Ref Range Status   Specimen Description BLOOD LEFT ARM   Final   Special Requests BOTTLES DRAWN AEROBIC ONLY 2CC   Final   Culture  Setup Time     Final   Value: 12/03/2013 03:43     Performed at [facility]   Culture     Final   Value: NO GROWTH 5 DAYS     Performed at [facility]   Report Status 31-Dec-2013 FINAL   Final  CULTURE, BLOOD (ROUTINE X 2)     Status: None   Collection Time    12/01/2013  9:57 PM      Result Value Ref Range Status   Specimen Description BLOOD RIGHT FEMORAL ARTERY   Final     Special Requests BOTTLES DRAWN AEROBIC AND ANAEROBIC [facility] EACH   Final   Culture  Setup Time     Final   Value: 12/03/2013 03:43     Performed at [facility]  Final   Value: NO GROWTH 5 DAYS     Performed at [facility]   Report Status 12/04/2013 FINAL   Final  URINE CULTURE     Status: None   Collection Time    12/03/13  2:07 AM      Result Value Ref Range Status   Specimen Description URINE, RANDOM   Final   Special Requests NONE   Final   Culture  Setup Time     Final   Value: 12/03/2013 12:35     Performed at [facility] Count     Final   Value: NO GROWTH     Performed at [facility]   Culture     Final   Value: NO GROWTH     Performed at [facility]   Report Status 12/04/2013 FINAL   Final  RAPID STREP SCREEN     Status: None   Collection Time    12/03/13  2:08 AM      Result Value Ref Range Status   Streptococcus, Group A Screen (Direct) NEGATIVE  NEGATIVE Final   Comment: (NOTE)     A Rapid Antigen test may result negative if the antigen level in the     sample is below the detection level of this test. The FDA has not     cleared this test as a stand-alone test therefore the rapid antigen     negative result has reflexed to a Group A Strep culture.  CULTURE, GROUP A STREP     Status: None   Collection Time    12/03/13  2:08 AM      Result Value Ref Range Status   Specimen Description THROAT   Final   Special Requests NONE   Final   Culture     Final   Value: STREPTOCOCCUS,BETA HEMOLYTIC NOT GROUP A     Performed at [facility]   Report Status 12/05/2013 FINAL   Final  MRSA PCR SCREENING     Status: None   Collection Time    12/03/13  2:38 AM      Result Value Ref Range Status   MRSA by PCR NEGATIVE  NEGATIVE Final   Comment:            The GeneXpert MRSA Assay (FDA     approved for NASAL specimens     only), is one component of a     comprehensive MRSA colonization      surveillance program. It is not     intended to diagnose MRSA     infection nor to guide or     monitor treatment for     MRSA infections.  CULTURE, RESPIRATORY (NON-EXPECTORATED)     Status: None   Collection Time    12/07/13 12:31 PM      Result Value Ref Range Status   Specimen Description TRACHEAL ASPIRATE   Final   Special Requests Immunocompromised   Final   Gram Stain     Final   Value: FEW WBC PRESENT,BOTH PMN AND MONONUCLEAR     RARE SQUAMOUS EPITHELIAL CELLS PRESENT     NO ORGANISMS SEEN     Performed at [facility]   Culture     Final   Value: FEW KLEBSIELLA PNEUMONIAE     Performed at [facility]   Report Status 12/10/2013 FINAL   Final   Organism ID, Bacteria KLEBSIELLA PNEUMONIAE   Final  PNEUMOCYSTIS JIROVECI SMEAR BY DFA     Status: None   Collection Time    12/07/13 12:38 PM      Result Value Ref Range Status   Specimen Source-PJSRC ENDOTRACHEAL   Final   Pneumocystis jiroveci Ag NEGATIVE   Final   Comment: Performed at [facility]  CULTURE, BAL-QUANTITATIVE     Status: None   Collection Time    Dec 31, 2013 12:17 PM      Result Value Ref Range Status   Specimen Description BRONCHIAL ALVEOLAR LAVAGE   Final   Special Requests Immunocompromised   Final   Gram Stain     Final   Value: FEW WBC PRESENT,BOTH PMN AND MONONUCLEAR     NO SQUAMOUS EPITHELIAL CELLS SEEN     NO ORGANISMS SEEN     Performed at [facility] Count     Final   Value: 10,000 COLONIES/ML     Performed at [facility]   Culture     Final   Value: Non-Pathogenic Oropharyngeal-type Flora Isolated.     Performed at [facility]   Report Status 12/11/2013 FINAL   Final  FUNGUS CULTURE W SMEAR     Status: None   Collection Time    12-31-2013 12:21 PM      Result Value Ref Range Status   Specimen Description BRONCHIAL ALVEOLAR LAVAGE   Final   Special Requests Immunocompromised   Final   Fungal Smear     Final   Value: NO YEAST  OR FUNGAL ELEMENTS SEEN     Performed at [facility]   Culture     Final   Value: CULTURE IN PROGRESS FOR FOUR WEEKS     Performed at [facility]   Report Status PENDING   Incomplete    Studies/Results: US Abdomen Port  12/11/2013   CLINICAL DATA:  58 year old male with renal failure, sepsis and thrombocytopenia  EXAM: ULTRASOUND PORTABLE ABDOMEN  COMPARISON:  12/07/2013 CT  FINDINGS: Gallbladder:  Gallbladder sludge identified. There is no evidence of cholelithiasis or acute cholecystitis.  Common bile duct:  Diameter: 4.4 mm. There is no evidence of intrahepatic or extrahepatic biliary dilatation.  Liver:  No focal lesion identified. Within normal limits in parenchymal echogenicity.  IVC:  No abnormality visualized.  Pancreas:  Visualized portion unremarkable.  Spleen:  Size and appearance within normal limits.  Right Kidney:  Length: 12.9 cm. Echogenicity is upper limits of normal. There is no evidence of solid mass or hydronephrosis.  Left Kidney:  Length: 13 cm. Echogenicity is upper limits of normal. There is no evidence of solid mass or hydronephrosis.  Abdominal aorta:  No aneurysm visualized.  Other findings:  Small bilateral pleural effusions and small amount and ascites noted.  IMPRESSION: Gallbladder sludge without cholelithiasis or evidence of acute cholecystitis.  Small bilateral pleural effusions of small amount of ascites.  Upper limits of normal renal echogenicity which can be seen with medical renal disease.   Electronically Signed   By: Laveda Abbe M.D.   On: 12/11/2013 12:41   Dg Chest Port 1 View  12/11/2013   CLINICAL DATA:  58 year old male with ARDS.  EXAM: PORTABLE CHEST - 1 VIEW  COMPARISON:  12/10/2013 and prior chest radiographs  FINDINGS: An endotracheal tube with tip 4.6 cm above the carina, a right IJ central venous catheter with tip overlying the mid SVC, and small bore feeding tube tip overlying the proximal stomach  noted.  Bilateral airspace opacities/  edema again noted.  Left lower lung consolidations/ atelectasis again noted.  Small bilateral pleural effusions are unchanged.  An equivocal miniscule left apical pneumothorax may be present.  Bilateral subcutaneous emphysema and pneumomediastinum have decreased.  IMPRESSION: Equivocal miniscule left apical pneumothorax - not significantly changed.  Decreasing pneumomediastinum and subcutaneous emphysema.  Unchanged bilateral airspace opacities and left lower lung consolidations/atelectasis.  Small bore feeding tube with tip overlying the proximal stomach.   Electronically Signed   By: Laveda Abbe M.D.   On: 12/11/2013 07:22   Dg Chest Port 1 View  12/10/2013   CLINICAL DATA:  Respiratory distress.  Left pneumothorax.  EXAM: PORTABLE CHEST - 1 VIEW  COMPARISON:  12/15/2013 and prior chest radiographs dating back to 10/04/2013  FINDINGS: The cardiomediastinal silhouette is stable.  An endotracheal tube with tip 5.6 cm above the carina, small bore feeding tube entering the stomach with tip off the field of view, and left IJ central venous catheter with tip overlying the mid SVC again noted.  A miniscule left apical pneumothorax is noted.  Increasing subcutaneous emphysema, left-greater-than-right, and pneumomediastinum identified.  Diffuse bilateral airspace opacities are present.  IMPRESSION: Miniscule left apical pneumothorax.  Increasing pneumomediastinum and bilateral subcutaneous emphysema, left-greater-than-right. Stable diffuse bilateral airspace opacities.   Electronically Signed   By: Laveda Abbe M.D.   On: 12/10/2013 12:36      Assessment/Plan:  Active Problems:   Septic shock    Lawrence Jennings is a 58 y.o. male with  Psoriatic arthritis on steroids and humira and recently rx for cellulitis with doxy, admitted with sepsis multi focal infiltrates RUL cavitary area  #1 Septic shock: He has not grown much from sputum besided klebsiella from sputum. BAL unremarkable as are blood and urine cx  --keep  on broad spectrum abx with cefepime, vanco, flagyl and vfend  #2 Cavitary PNA'; suspcious for area where Aspergillus could be wreaking havoc.   --continue VFEND  Dr Orvan Falconer is back tomorrow.   LOS: 9 days   Acey Lav 12/11/2013, 10:44 PM

## 2013-12-11 NOTE — Progress Notes (Signed)
ANTIBIOTIC CONSULT NOTE - INITIAL  Pharmacy Consult for Cefepime + Voriconazole + Vancomycin Indication: Empiric PNA/sepsis coverage  No Known Allergies  Patient Measurements: Height: 5' 11.65" (182 cm) Weight: 173 lb 8 oz (78.7 kg) IBW/kg (Calculated) : 76.8  Vital Signs: Temp: 96.8 F (36 C) (08/09 0806) Temp src: Oral (08/09 0806) BP: 92/45 mmHg (08/09 1150) Pulse Rate: 99 (08/09 1150) Intake/Output from previous day: 08/08 0701 - 08/09 0700 In: 3576.1 [I.V.:2829.1; IV Piggyback:747] Out: 1490 [Urine:1490] Intake/Output from this shift: Total I/O In: 475.5 [I.V.:375.5; IV Piggyback:100] Out: -   Labs:  Recent Labs  2013/12/27 0425  12-27-13 2345 12/10/13 0524 12/11/13 0107  WBC 11.5*  --   --   --  9.6  HGB 9.4*  --   --   --  7.0*  PLT 44*  --   --   --  50*  CREATININE 1.00  < > 1.33 1.51* 1.97*  < > = values in this interval not displayed. Estimated Creatinine Clearance: 45.5 ml/min (by C-G formula based on Cr of 1.97).  Recent Labs  12/11/13 1200  VANCOTROUGH 50.7*     Microbiology: Recent Results (from the past 720 hour(s))  CULTURE, BLOOD (ROUTINE X 2)     Status: None   Collection Time    11/22/2013  9:39 PM      Result Value Ref Range Status   Specimen Description BLOOD LEFT ARM   Final   Special Requests BOTTLES DRAWN AEROBIC ONLY 2CC   Final   Culture  Setup Time     Final   Value: 12/03/2013 03:43     Performed at [facility]   Culture     Final   Value: NO GROWTH 5 DAYS     Performed at [facility]   Report Status 12/27/2013 FINAL   Final  CULTURE, BLOOD (ROUTINE X 2)     Status: None   Collection Time    11/04/2013  9:57 PM      Result Value Ref Range Status   Specimen Description BLOOD RIGHT FEMORAL ARTERY   Final   Special Requests BOTTLES DRAWN AEROBIC AND ANAEROBIC [facility] EACH   Final   Culture  Setup Time     Final   Value: 12/03/2013 03:43     Performed at [facility]   Culture     Final   Value: NO  GROWTH 5 DAYS     Performed at [facility]   Report Status Dec 27, 2013 FINAL   Final  URINE CULTURE     Status: None   Collection Time    12/03/13  2:07 AM      Result Value Ref Range Status   Specimen Description URINE, RANDOM   Final   Special Requests NONE   Final   Culture  Setup Time     Final   Value: 12/03/2013 12:35     Performed at [facility] Count     Final   Value: NO GROWTH     Performed at [facility]   Culture     Final   Value: NO GROWTH     Performed at [facility]   Report Status 12/04/2013 FINAL   Final  RAPID STREP SCREEN     Status: None   Collection Time    12/03/13  2:08 AM      Result Value Ref Range Status   Streptococcus, Group A Screen (Direct) NEGATIVE  NEGATIVE Final   Comment: (NOTE)     A Rapid Antigen test may result negative if the antigen level in the     sample is below the detection level of this test. The FDA has not     cleared this test as a stand-alone test therefore the rapid antigen     negative result has reflexed to a Group A Strep culture.  CULTURE, GROUP A STREP     Status: None   Collection Time    12/03/13  2:08 AM      Result Value Ref Range Status   Specimen Description THROAT   Final   Special Requests NONE   Final   Culture     Final   Value: STREPTOCOCCUS,BETA HEMOLYTIC NOT GROUP A     Performed at [facility]   Report Status 12/05/2013 FINAL   Final  MRSA PCR SCREENING     Status: None   Collection Time    12/03/13  2:38 AM      Result Value Ref Range Status   MRSA by PCR NEGATIVE  NEGATIVE Final   Comment:            The GeneXpert MRSA Assay (FDA     approved for NASAL specimens     only), is one component of a     comprehensive MRSA colonization     surveillance program. It is not     intended to diagnose MRSA     infection nor to guide or     monitor treatment for     MRSA infections.  CULTURE, RESPIRATORY (NON-EXPECTORATED)     Status: None   Collection  Time    12/07/13 12:31 PM      Result Value Ref Range Status   Specimen Description TRACHEAL ASPIRATE   Final   Special Requests Immunocompromised   Final   Gram Stain     Final   Value: FEW WBC PRESENT,BOTH PMN AND MONONUCLEAR     RARE SQUAMOUS EPITHELIAL CELLS PRESENT     NO ORGANISMS SEEN     Performed at [facility]   Culture     Final   Value: FEW KLEBSIELLA PNEUMONIAE     Performed at [facility]   Report Status 12/07/2013 FINAL   Final   Organism ID, Bacteria KLEBSIELLA PNEUMONIAE   Final  PNEUMOCYSTIS JIROVECI SMEAR BY DFA     Status: None   Collection Time    12/07/13 12:38 PM      Result Value Ref Range Status   Specimen Source-PJSRC ENDOTRACHEAL   Final   Pneumocystis jiroveci Ag NEGATIVE   Final   Comment: Performed at [facility]  CULTURE, BAL-QUANTITATIVE     Status: None   Collection Time    12/19/2013 12:17 PM      Result Value Ref Range Status   Specimen Description BRONCHIAL ALVEOLAR LAVAGE   Final   Special Requests Immunocompromised   Final   Gram Stain     Final   Value: FEW WBC PRESENT,BOTH PMN AND MONONUCLEAR     NO SQUAMOUS EPITHELIAL CELLS SEEN     NO ORGANISMS SEEN     Performed at [facility] Count     Final   Value: 10,000 COLONIES/ML     Performed at [facility]   Culture     Final   Value: Non-Pathogenic Oropharyngeal-type Flora Isolated.     Performed  at [facility]   Report Status PENDING   Incomplete  FUNGUS CULTURE W SMEAR     Status: None   Collection Time    01-Jan-2014 12:21 PM      Result Value Ref Range Status   Specimen Description BRONCHIAL ALVEOLAR LAVAGE   Final   Special Requests Immunocompromised   Final   Fungal Smear     Final   Value: NO YEAST OR FUNGAL ELEMENTS SEEN     Performed at [facility]   Culture     Final   Value: CULTURE IN PROGRESS FOR FOUR WEEKS     Performed at [facility]   Report Status PENDING   Incomplete    Medical  History: Past Medical History  Diagnosis Date  . Allergic reaction   . Sinus disease   . Back pain   . Arthritis   . Edema   . Hypertension   . Diabetes mellitus without complication     insulin/metformin    Assessment: 57 YOM admitted on 8/1 after being found down with AMS + weakness. Empiric antibiotics were started to cover for r/o sepsis/PNA. Given the patient's history of chronic steroids & humira - fungal coverage was started with Micafungin. A chest CT on 8/5 revelaed penumomediastinum and likely cavitary PNA (4 cm lesion) with ground glass opacities. PCP was negative. RCx from 8/5 grew fairly sensitive K PNA, BAL was done on 8/7. ID was consulted on 8/8 and antibiotics were adjusted to Cefepime + Vanc + Flagyl + Voriconazole.   The patient's renal function continues to worsen. SCr 1.97 << 1.51, CrCl~40-50 ml/min, UOP 0.5 ml/kg/hr. The Vancomycin trough was missed that was scheduled for 0000 on 8/9 and rescheduled for 1200. This resulted as SUPRAtherapeutic (VT 50.4 mcg/ml, goal of 15-20 mcg/ml) - this is due to accumulation in the setting of reduced renal function. Will hold Vancomycin doses and will plan to recheck another level to further guide dosing.  Voriconazole IV is also not recommended when CrCl<50 ml/min due to the accumulation of the diluent cyclodextrin. This was discussed with ID (Dr. Daiva Eves) and will transition the patient to oral crushed tablets per tube. Literature has shown the bioavailability to be good with this method compared to solution and easier to administer via tube (Reference: [employer]. 2007 Mar; 51(3): 295-284).   Will also plan to adjust Cefepime in the setting of worsening renal function.   Bactrim 8/5 >> 8/6 Vanc 7/31 >> 8/2; 8/5>> * 8/9 VT 50.7 mcg/ml >> hold Vanc Zosyn 8/1 >> 8/8 Mica 8/1 >> 8/8 Cefepime 8/8 >>  Voriconazole 8/8 >> Flagyl 8/8 >>  7/31 BCx >ngf 8/1 UCx >ngf 8/1 rapid strep - neg 8/1 RCx >>B hemolytic strep,  not grp A 8/5 RCx >> Kleb pneumo (R-amp, S-all others) 8/5 PCP>>neg 8/7 BAL>> NOF 8/7 fungal>> pending  Goal of Therapy:  Vancomycin trough of 15-20 mcg/ml Proper antibiotics for infection/cultures adjusted for renal/hepatic function  Plan:  1. Hold Vancomycin doses 2. Reduce Cefepime to 1g IV every 24 hours 3. D/c IV Voriconazole and transition to Voriconazole 300 mg (~4 mg/kg) crushed via tube every 12 hours 4. Will continue to follow renal function, culture results, LOT, and antibiotic de-escalation plans   Georgina Pillion, PharmD, BCPS Clinical Pharmacist Pager: [phone] 12/11/2013 1:14 PM

## 2013-12-11 NOTE — Progress Notes (Signed)
ANTIBIOTIC CONSULT NOTE - FOLLOW UP  Pharmacy Consult for Vancomycin  Indication: rule out pneumonia and rule out sepsis  Assessment: Vancomycin was administered before midnight trough was drawn   Plan:  -Re-schedule vancomycin trough for 1200 today  Abran Duke 12/11/2013,2:31 AM

## 2013-12-11 NOTE — Progress Notes (Signed)
PULMONARY / CRITICAL CARE MEDICINE   Name: Lawrence Jennings MRN: [id number] DOB: Jul 29, 1955    ADMISSION DATE:  11/23/2013 CONSULTATION DATE:  12/03/13   REFERRING MD :  ED  CHIEF COMPLAINT:  AMS  INITIAL PRESENTATION: 58 y/o M with PMH of psoriatic arthritis on Humira + Pred, recent admission (7/3-7/7) for FTT & cellulitis (doxy) who presented to [facility] ER on 7/31 after being found by family with AMS & weakness.  Intubated for hypoxic respiratory failure / ALI.    PMH - mild thrombocytopenia of unknown etiology,diabetes that has been poorly controlled.   STUDIES:  8/2 - Echo EF 60% 8/04 - CT Chest with Pneumomediastinum and RLL cavitary lesion  SIGNIFICANT EVENTS: 8/01 - admitted to ICU for hypoxic respiratory failure 8/03 - levo gtt weaned off 8/06 - breath stacking, changed MV to pressure control, changed sedation, large/dark residuals from OGT 8/07 - small LUL PTX, worsening hypotension and CXR, ARDS protocol started, and paralytics, upper EGD with small gastric ulcers.  8/9 stopped nmb to prevent neuropathy and evaluate further need.   SUBJECTIVE: Sedated and  NMB stopped at 0830  VITAL SIGNS: Temp:  [96.8 F (36 C)-97.6 F (36.4 C)] 96.8 F (36 C) (08/09 0806) Pulse Rate:  [90-105] 96 (08/09 0900) Resp:  [10-32] 32 (08/09 0857) BP: (82-109)/(50-66) 90/55 mmHg (08/09 0900) SpO2:  [93 %-100 %] 99 % (08/09 0900) Arterial Line BP: (91-118)/(50-67) 94/52 mmHg (08/09 0800) FiO2 (%):  [50 %-60 %] 50 % (08/09 0858)  VENTILATOR SETTINGS: Vent Mode:  [-] PRVC FiO2 (%):  [50 %-60 %] 50 % Set Rate:  [30 bmp] 30 bmp Vt Set:  [450 mL] 450 mL PEEP:  [8 cmH20] 8 cmH20 Plateau Pressure:  [22 cmH20-24 cmH20] 22 cmH20  INTAKE / OUTPUT:  Intake/Output Summary (Last 24 hours) at 12/11/13 0952 Last data filed at 12/11/13 0900  Gross per 24 hour  Intake 3475.85 ml  Output   1315 ml  Net 2160.85 ml    PHYSICAL EXAMINATION: General:  Sedated, nmb Neuro:  Sedated, RASS -3, nmb  stopped at 0830 HEENT:  Proptosis, PERRL, EOMI, OP clear, dry mucosa, subcutaneous air (crepitus) no change 8/9,multiple missing teeth Cardiovascular:  Tachycardic, regular rhythm, no MRG Lungs: Decreased bs bases, + rhonchi bilateral ,  Diminished BS, no HSM.  Musculoskeletal:  +pedal edema Skin:   hyperpigmented areas on face and chest   LABS:  CBC  Recent Labs Lab 12/08/13 0545 12-12-2013 0425 12/11/13 0107  WBC 10.4 11.5* 9.6  HGB 8.2* 9.4* 7.0*  HCT 25.5* 29.9* 22.9*  PLT 42* 44* 50*   BMET  Recent Labs Lab 12/12/13 2345 12/10/13 0524 12/11/13 0107  NA 145 147 144  K 3.7 4.0 3.8  CL 112 113* 109  CO2 23 23 23   BUN 25* 28* 37*  CREATININE 1.33 1.51* 1.97*  GLUCOSE 126* 156* 170*   Electrolytes  Recent Labs Lab 12/05/13 0020  12/05/13 0740  12/07/13 1515  12/12/13 2345 12/10/13 0524 12/11/13 0107  CALCIUM  --   < >  --   < > 6.9*  < > 6.5* 6.5* 6.4*  MG 2.3  --  2.2  --  1.9  --   --   --   --   PHOS 2.6  --  2.2*  --  2.3  --   --   --   --   < > = values in this interval not displayed.  Sepsis Markers  Recent Labs Lab 2013-12-12 0425 12-12-13  1300 12/10/13 1205  LATICACIDVEN 2.3* 2.3* 2.0   ABG  Recent Labs Lab 12/30/2013 2231 12/10/13 1152 12/11/13 0342  PHART 7.290* 7.205* 7.281*  PCO2ART 51.3* 23.6* 48.4*  PO2ART 84.0 82.0 54.9*   Liver Enzymes  Recent Labs Lab 12/06/13 0458 12/08/13 0545  AST 98* 92*  ALT 12 12  ALKPHOS 135* 154*  BILITOT 0.9 0.5  ALBUMIN 1.4* 1.1*   Glucose  Recent Labs Lab 12/10/13 1124 12/10/13 1533 12/10/13 2004 12/11/13 0003 12/11/13 0405 12/11/13 0721  GLUCAP 115* 119* 142* 154* 158* 161*    Imaging Dg Chest Port 1 View  12/10/2013   CLINICAL DATA:  Respiratory distress.  Left pneumothorax.  EXAM: PORTABLE CHEST - 1 VIEW  COMPARISON:  12/10/2013 and prior chest radiographs dating back to 10/04/2013  FINDINGS: The cardiomediastinal silhouette is stable.  An endotracheal tube with tip 5.6 cm  above the carina, small bore feeding tube entering the stomach with tip off the field of view, and left IJ central venous catheter with tip overlying the mid SVC again noted.  A miniscule left apical pneumothorax is noted.  Increasing subcutaneous emphysema, left-greater-than-right, and pneumomediastinum identified.  Diffuse bilateral airspace opacities are present.  IMPRESSION: Miniscule left apical pneumothorax.  Increasing pneumomediastinum and bilateral subcutaneous emphysema, left-greater-than-right. Stable diffuse bilateral airspace opacities.   Electronically Signed   By: Laveda Abbe M.D.   On: 12/10/2013 12:36    8/9 CxR: NSC   ASSESSMENT / PLAN:  PULMONARY  A:  Acute Hypoxic Resp Failure/ARDS Sepsis\Cavitation on CT with pneumomediastium, concern for Fungal infection Small LUL PTX Hypoxia  P:   ARDS Protocol ( paralytics stopped 8/9 eval for cip) Trend CXR and abgs, may need bicarb in future. PH better 8/9 Abx per. ID consult  PTX is small, but with hypoxia will have to increase PEEP and may place chest tube - for now will monitor with serial cxr and hemodynamics  CARDIOVASCULAR CVL:8/1 l i j cvl>> A:  Septic shock - levophed gtt weaned off 8/3 am , currently off pressors Hypotension - on versed and fentanyl P:  Stress steroids in setting of chronic prednisone administration and steroid responsive lung disease, started weaning on 8/4 to 50 q6 hrs (previouly 100 q8hrs), will keep at 200 total since clinical status is worsening  RENAL A:   AKI - Likely secondary to volume depletion and hypotension, worsening Hypernatremia\Dehydration Metabolic acidosis P:   Reduce W1U to @ 75cc\hr and monitor lytes Trend BMP Monitor UOP Scheduled 40 KCL QD Recheck lactic acid 8/8(2.0) Renal US 8/9>>  GASTROINTESTINAL A:   Vent associated Dysphagia  ? Aspiration component Increased Gastric Residual / ABD Distention Hypertriglyceridemia  P:   Large residuals, still with no BM -   Large gastric residuals - guiac positive, GI consulted (Dr. Elnoria Howard) - small multiple gastric ulcers (related to OGT suction trauma) --> recommend to use small, flexible, weighted tube (Dubhoff) and only use for meds and flushes, no feeding through tube at this time. DO NOT PLACE OGT TO SUCTION.   PPI   HEMATOLOGIC A:   Thrombocytopenia - Mild - unknown etiology.  Noted since July 2015 Anemia Hgb 7.0 8/9 P:  Trend CBC, monitor platelets, avoid toxic drugs.  Transfuse for hgb <7.0 INFECTIOUS A:   HCAP - in immunocompromised patient (Humira and prednisone) Per ID P:   Rx HCAP now with Cavitary lesion, high concern for fungal infection - will get bronchoscopy today with BAL ( will adjust abx\antifungal based on BAL results).  BCx2 7/31 >> ng UC 8/1 >> ng Sputum 8/5 >> K. pneumonia Abx:  zosyn, start date 8/1>>8/7,  Vanc, start date 8/5  Micafungin, start date 8/1>>8/7, Flagyl 8/7>> Cefepime 8/7>> Vfend 8/8>> ENDOCRINE A:   DM II Chronic Steroid Administration   Proptosis - TSH nml  P:   SSI Tapering solu-cortef  Lacrilube for eye protection  NEUROLOGIC A:   AMS P:   RASS goal: -3 Daily WUA Propofol stopped due to high trigs Precedex stopped due not controlling agitation  Versed gtt Fentanyl gtt PRN versed  TODAY'S SUMMARY: 58 y/o with FTT, psoriatic arthritis admitted with HCAP, AKI, resolved septic shock, RLL\RML cavitation with pneumomediastium on CT chest and LUL PTX on CXR, correcting Hyponatremia\dehydration, treating HCAP . On ARDS protocol . NMB stopped 8/9 and evaluate need. PH better reason for improvement not clear. Renal function continues to slowly decline. Abx per ID. Cytology from [facility] pending.  Brett Canales Minor ACNP [facility] Pager [phone] till 3 pm If no answer page [phone] 12/11/2013, 9:52 AM  Reviewed above, examined.  Off nimbex.  Acidosis improved >> still unclear what this was from.  Continue on full vent support and current  Abx/antifungals.  CC time 35 minutes.  Coralyn Helling, MD [facility] Pulmonary/Critical Care 12/11/2013, 2:30 PM Pager:  [phone] After 3pm call: [phone]

## 2013-12-12 ENCOUNTER — Inpatient Hospital Stay (HOSPITAL_COMMUNITY): Payer: Medicaid Other

## 2013-12-12 ENCOUNTER — Encounter (HOSPITAL_COMMUNITY): Payer: Self-pay | Admitting: Gastroenterology

## 2013-12-12 DIAGNOSIS — R918 Other nonspecific abnormal finding of lung field: Secondary | ICD-10-CM

## 2013-12-12 LAB — HIV-1 RNA ULTRAQUANT REFLEX TO GENTYP+
HIV 1 RNA Quant: <20 copies/mL (ref <20)
HIV-1 RNA Quant, Log: <1.3 {Log} (ref <1.30)

## 2013-12-12 LAB — CBC
HEMATOCRIT: 22 % — AB (ref 39.0–52.0)
Hemoglobin: 7.1 g/dL — ABNORMAL LOW (ref 13.0–17.0)
MCH: 27.4 pg (ref 26.0–34.0)
MCHC: 32.3 g/dL (ref 30.0–36.0)
MCV: 84.9 fL (ref 78.0–100.0)
Platelets: 51 10*3/uL — ABNORMAL LOW (ref 150–400)
RBC: 2.59 MIL/uL — ABNORMAL LOW (ref 4.22–5.81)
RDW: 19.1 % — ABNORMAL HIGH (ref 11.5–15.5)
WBC: 13.7 10*3/uL — ABNORMAL HIGH (ref 4.0–10.5)

## 2013-12-12 LAB — BLOOD GAS, ARTERIAL
ACID-BASE DEFICIT: 7.8 mmol/L — AB (ref 0.0–2.0)
BICARBONATE: 18.6 meq/L — AB (ref 20.0–24.0)
Drawn by: 41977
FIO2: 0.5 %
LHR: 30 {breaths}/min
MECHVT: 450 mL
O2 Saturation: 91.2 %
PATIENT TEMPERATURE: 98.6
PEEP: 8 cmH2O
PO2 ART: 69.5 mmHg — AB (ref 80.0–100.0)
TCO2: 20.1 mmol/L (ref 0–100)
pCO2 arterial: 47.7 mmHg — ABNORMAL HIGH (ref 35.0–45.0)
pH, Arterial: 7.215 — ABNORMAL LOW (ref 7.350–7.450)

## 2013-12-12 LAB — POCT I-STAT 3, ART BLOOD GAS (G3+)
ACID-BASE DEFICIT: 7 mmol/L — AB (ref 0.0–2.0)
Acid-base deficit: 5 mmol/L — ABNORMAL HIGH (ref 0.0–2.0)
Bicarbonate: 20.2 mEq/L (ref 20.0–24.0)
Bicarbonate: 19.6 mEq/L — ABNORMAL LOW (ref 20.0–24.0)
O2 Saturation: 89 %
O2 Saturation: 82 %
PCO2 ART: 38.7 mmHg (ref 35.0–45.0)
PH ART: 7.323 — AB (ref 7.350–7.450)
PH ART: 7.293 — AB (ref 7.350–7.450)
Patient temperature: 97.6
TCO2: 21 mmol/L (ref 0–100)
TCO2: 21 mmol/L (ref 0–100)
pCO2 arterial: 40.1 mmHg (ref 35.0–45.0)
pO2, Arterial: 58 mmHg — ABNORMAL LOW (ref 80.0–100.0)
pO2, Arterial: 50 mmHg — ABNORMAL LOW (ref 80.0–100.0)

## 2013-12-12 LAB — BASIC METABOLIC PANEL
ANION GAP: 16 — AB (ref 5–15)
Anion gap: 16 — ABNORMAL HIGH (ref 5–15)
Anion gap: 15 (ref 5–15)
BUN: 50 mg/dL — ABNORMAL HIGH (ref 6–23)
BUN: 55 mg/dL — ABNORMAL HIGH (ref 6–23)
BUN: 56 mg/dL — ABNORMAL HIGH (ref 6–23)
CALCIUM: 6.6 mg/dL — AB (ref 8.4–10.5)
CALCIUM: 6.4 mg/dL — AB (ref 8.4–10.5)
CHLORIDE: 105 meq/L (ref 96–112)
CO2: 20 meq/L (ref 19–32)
CO2: 19 mEq/L (ref 19–32)
CO2: 18 meq/L — AB (ref 19–32)
CREATININE: 2.77 mg/dL — AB (ref 0.50–1.35)
Calcium: 6.2 mg/dL — CL (ref 8.4–10.5)
Chloride: 107 mEq/L (ref 96–112)
Chloride: 107 mEq/L (ref 96–112)
Creatinine, Ser: 2.61 mg/dL — ABNORMAL HIGH (ref 0.50–1.35)
Creatinine, Ser: 2.8 mg/dL — ABNORMAL HIGH (ref 0.50–1.35)
GFR calc Af Amer: 30 mL/min — ABNORMAL LOW (ref >90)
GFR calc Af Amer: 27 mL/min — ABNORMAL LOW (ref >90)
GFR calc non Af Amer: 24 mL/min — ABNORMAL LOW (ref >90)
GFR calc non Af Amer: 24 mL/min — ABNORMAL LOW (ref >90)
GFR, EST AFRICAN AMERICAN: 28 mL/min — AB (ref >90)
GFR, EST NON AFRICAN AMERICAN: 26 mL/min — AB (ref >90)
GLUCOSE: 115 mg/dL — AB (ref 70–99)
Glucose, Bld: 150 mg/dL — ABNORMAL HIGH (ref 70–99)
Glucose, Bld: 149 mg/dL — ABNORMAL HIGH (ref 70–99)
POTASSIUM: 3.7 meq/L (ref 3.7–5.3)
Potassium: 3.9 mEq/L (ref 3.7–5.3)
Potassium: 4 mEq/L (ref 3.7–5.3)
SODIUM: 143 meq/L (ref 137–147)
SODIUM: 141 meq/L (ref 137–147)
Sodium: 139 mEq/L (ref 137–147)

## 2013-12-12 LAB — PROTIME-INR
INR: 1.47 (ref 0.00–1.49)
Prothrombin Time: 17.8 seconds — ABNORMAL HIGH (ref 11.6–15.2)

## 2013-12-12 LAB — GLUCOSE, CAPILLARY
GLUCOSE-CAPILLARY: 127 mg/dL — AB (ref 70–99)
GLUCOSE-CAPILLARY: 143 mg/dL — AB (ref 70–99)
GLUCOSE-CAPILLARY: 145 mg/dL — AB (ref 70–99)
GLUCOSE-CAPILLARY: 86 mg/dL (ref 70–99)
Glucose-Capillary: 104 mg/dL — ABNORMAL HIGH (ref 70–99)
Glucose-Capillary: 153 mg/dL — ABNORMAL HIGH (ref 70–99)

## 2013-12-12 LAB — PHOSPHORUS: PHOSPHORUS: 5.3 mg/dL — AB (ref 2.3–4.6)

## 2013-12-12 LAB — MAGNESIUM: MAGNESIUM: 1.5 mg/dL (ref 1.5–2.5)

## 2013-12-12 LAB — APTT: aPTT: 38 seconds — ABNORMAL HIGH (ref 24–37)

## 2013-12-12 MED ORDER — SODIUM CHLORIDE 0.9 % IV BOLUS (SEPSIS)
1000.0000 mL | Freq: Once | INTRAVENOUS | Status: AC
  Administered 2013-12-12: 1000 mL via INTRAVENOUS

## 2013-12-12 MED ORDER — MAGNESIUM SULFATE IN D5W 10-5 MG/ML-% IV SOLN
1.0000 g | Freq: Once | INTRAVENOUS | Status: AC
  Administered 2013-12-12: 1 g via INTRAVENOUS
  Filled 2013-12-12: qty 100

## 2013-12-12 MED ORDER — ARTIFICIAL TEARS OP OINT: 1.0000 "application " | TOPICAL_OINTMENT | Freq: Three times a day (TID) | OPHTHALMIC | Status: DC

## 2013-12-12 MED ORDER — SODIUM CHLORIDE 0.9 % IV SOLN
3.0000 ug/kg/min | INTRAVENOUS | Status: DC
  Administered 2013-12-12: 3 ug/kg/min via INTRAVENOUS
  Administered 2013-12-13: 3.029 ug/kg/min via INTRAVENOUS
  Administered 2013-12-13: 3 ug/kg/min via INTRAVENOUS
  Filled 2013-12-12 (×2): qty 20

## 2013-12-12 MED ORDER — PRO-STAT SUGAR FREE PO LIQD
30.0000 mL | Freq: Three times a day (TID) | ORAL | Status: DC
  Administered 2013-12-12 – 2013-12-14 (×5): 30 mL
  Filled 2013-12-12 (×8): qty 30

## 2013-12-12 MED ORDER — OXEPA PO LIQD
1000.0000 mL | ORAL | Status: DC
  Administered 2013-12-12: 1000 mL
  Filled 2013-12-12 (×4): qty 1000

## 2013-12-12 MED ORDER — CISATRACURIUM BOLUS VIA INFUSION
0.0500 mg/kg | Freq: Once | INTRAVENOUS | Status: AC
  Administered 2013-12-12: 4.3 mg via INTRAVENOUS
  Filled 2013-12-12: qty 5

## 2013-12-12 NOTE — Progress Notes (Signed)
RN updated me  1. Ongoing vent dysnchrony despite increase in sedation: start nimbex again   2. Renal failure - bmet sent, results pending: she will d/w eMD  3. Soft BP, low CVP - still persists despite IVF - repeat 1L fluid bolus again, restart pressors if needed   Dr. Kalman Shan, M.D., [facility].C.P Pulmonary and Critical Care Medicine Staff Physician [facility]  Pulmonary and Critical Care Pager: [phone], If no answer or between  15:00h - 7:00h: call [phone]  12/12/2013 3:19 PM

## 2013-12-12 NOTE — Progress Notes (Signed)
PULMONARY / CRITICAL CARE MEDICINE   Name: Lawrence Jennings MRN: [id number] DOB: 20-Dec-1955    ADMISSION DATE:  11/08/2013 CONSULTATION DATE:  12/03/13   REFERRING MD :  ED  CHIEF COMPLAINT:  AMS  INITIAL PRESENTATION: 58 y/o M with PMH of psoriatic arthritis on Humira + Pred, recent admission (7/3-7/7) for FTT & cellulitis (doxy) who presented to [facility] ER on 7/31 after being found by family with AMS & weakness.  Intubated for hypoxic respiratory failure / ALI.    PMH - mild thrombocytopenia of unknown etiology,diabetes that has been poorly controlled.   STUDIES:  8/2 - Echo EF 60% 8/04 - CT Chest with Pneumomediastinum and RLL cavitary lesion  SIGNIFICANT EVENTS: 8/01 - admitted to ICU for hypoxic respiratory failure 8/03 - levo gtt weaned off 8/06 - breath stacking, changed MV to pressure control, changed sedation, large/dark residuals from OGT 8/07 - small LUL PTX, worsening hypotension and CXR, ARDS protocol started, and paralytics, upper EGD with small gastric ulcers.  8/9 stopped nmb to prevent neuropathy and evaluate further need.  Sedated and  NMB stopped at 0830    SUBJECTIVE/OVERNIGHT/INTERVAL HX 12/12/13: On 50% fio2, peep 8; dysnchronous with ventilator. BP running soft. Heading into oligruia  VITAL SIGNS: Temp:  [96.5 F (35.8 C)-98.5 F (36.9 C)] 97.7 F (36.5 C) (08/10 0827) Pulse Rate:  [97-124] 117 (08/10 1100) Resp:  [30-34] 34 (08/10 0753) BP: (83-111)/(45-84) 98/66 mmHg (08/10 1100) SpO2:  [88 %-100 %] 94 % (08/10 1100) Arterial Line BP: (90-127)/(45-66) 96/46 mmHg (08/10 1100) FiO2 (%):  [50 %] 50 % (08/10 0800) Weight:  [86.4 kg (190 lb 7.6 oz)] 86.4 kg (190 lb 7.6 oz) (08/10 0500)  VENTILATOR SETTINGS: Vent Mode:  [-] PRVC FiO2 (%):  [50 %] 50 % Set Rate:  [30 bmp] 30 bmp Vt Set:  [450 mL] 450 mL PEEP:  [8 cmH20] 8 cmH20 Plateau Pressure:  [14 cmH20-26 cmH20] 20 cmH20  INTAKE / OUTPUT:  Intake/Output Summary (Last 24 hours) at 12/12/13 1130 Last  data filed at 12/12/13 1000  Gross per 24 hour  Intake 2091.98 ml  Output    293 ml  Net 1798.98 ml    PHYSICAL EXAMINATION: General: looks critically ill Neuro:  WUA on 70fent/versed 1mg  ->responded to pain and then vent dysnchrony became worse. Currently RASS -3 and ongoing vent dynshcrony HEENT:  Proptosis, PERRL, EOMI, OP clear, dry mucosa, subcutaneous air (crepitus) no change 8/9,multiple missing teeth Cardiovascular:  Tachycardic, regular rhythm, no MRG Lungs: Decreased bs bases, + rhonchi bilateral ,  Diminished BS, no HSM.  Musculoskeletal:  +pedal edema Skin:   hyperpigmented areas on face and chest   LABS:  PULMONARY  Recent Labs Lab 01/02/2014 1630 12/23/2013 2231 12/10/13 1152 12/11/13 0342 12/12/13 0807  PHART 7.288* 7.290* 7.205* 7.281* 7.323*  PCO2ART 53.2* 51.3* 23.6* 48.4* 38.7  PO2ART 71.9* 84.0 82.0 54.9* 58.0*  HCO3 24.6* 24.7* 9.3* 22.3 20.2  TCO2 26.3 26 10  23.8 21  O2SAT 91.7 95.0 94.0 87.6 89.0    CBC  Recent Labs Lab 12/03/2013 0425 12/11/13 0107 12/12/13 0400  HGB 9.4* 7.0* 7.1*  HCT 29.9* 22.9* 22.0*  WBC 11.5* 9.6 13.7*  PLT 44* 50* 51*    COAGULATION  Recent Labs Lab 12/12/13 0400  INR 1.47    CARDIAC  No results found for this basename: TROPONINI,  in the last 168 hours No results found for this basename: PROBNP,  in the last 168 hours   CHEMISTRY  Recent Labs Lab  12/07/13 1515  12/27/2013 1300 12/11/2013 2345 12/10/13 0524 12/11/13 0107 12/12/13 0400  NA 161*  < > 147 145 147 144 143  K 3.2*  < > 3.8 3.7 4.0 3.8 3.9  CL 126*  < > 111 112 113* 109 107  CO2 25  < > 25 23 23 23 20   GLUCOSE 181*  < > 156* 126* 156* 170* 115*  BUN 24*  < > 20 25* 28* 37* 50*  CREATININE 0.94  < > 1.09 1.33 1.51* 1.97* 2.61*  CALCIUM 6.9*  < > 6.5* 6.5* 6.5* 6.4* 6.6*  MG 1.9  --   --   --   --   --  1.5  PHOS 2.3  --   --   --   --   --  5.3*  < > = values in this interval not displayed. Estimated Creatinine Clearance: 34.3 ml/min (by  C-G formula based on Cr of 2.61).   LIVER  Recent Labs Lab 12/06/13 0458 12/08/13 0545 12/12/13 0400  AST 98* 92*  --   ALT 12 12  --   ALKPHOS 135* 154*  --   BILITOT 0.9 0.5  --   PROT 5.3* 5.0*  --   ALBUMIN 1.4* 1.1*  --   INR  --   --  1.47     INFECTIOUS  Recent Labs Lab 12/13/2013 0425 12/30/2013 1300 12/10/13 1205  LATICACIDVEN 2.3* 2.3* 2.0     ENDOCRINE CBG (last 3)   Recent Labs  12/11/13 2358 12/12/13 0355 12/12/13 0803  GLUCAP 86 104* 127*         IMAGING x48h US Abdomen Port  12/11/2013   CLINICAL DATA:  58 year old male with renal failure, sepsis and thrombocytopenia  EXAM: ULTRASOUND PORTABLE ABDOMEN  COMPARISON:  12/07/2013 CT  FINDINGS: Gallbladder:  Gallbladder sludge identified. There is no evidence of cholelithiasis or acute cholecystitis.  Common bile duct:  Diameter: 4.4 mm. There is no evidence of intrahepatic or extrahepatic biliary dilatation.  Liver:  No focal lesion identified. Within normal limits in parenchymal echogenicity.  IVC:  No abnormality visualized.  Pancreas:  Visualized portion unremarkable.  Spleen:  Size and appearance within normal limits.  Right Kidney:  Length: 12.9 cm. Echogenicity is upper limits of normal. There is no evidence of solid mass or hydronephrosis.  Left Kidney:  Length: 13 cm. Echogenicity is upper limits of normal. There is no evidence of solid mass or hydronephrosis.  Abdominal aorta:  No aneurysm visualized.  Other findings:  Small bilateral pleural effusions and small amount and ascites noted.  IMPRESSION: Gallbladder sludge without cholelithiasis or evidence of acute cholecystitis.  Small bilateral pleural effusions of small amount of ascites.  Upper limits of normal renal echogenicity which can be seen with medical renal disease.   Electronically Signed   By: Laveda Abbe M.D.   On: 12/11/2013 12:41   Dg Chest Port 1 View  12/12/2013   CLINICAL DATA:  Difficulty breathing  EXAM: PORTABLE CHEST - 1 VIEW   COMPARISON:  December 11, 2012  FINDINGS: Endotracheal tube tip is 4.3 cm above the carina. Feeding tube tip extends below the diaphragm. Central catheter tip is in the superior vena cava. No pneumothorax is discernible on this study. Note that there is subcutaneous air on the left, however.  Widespread interstitial and patchy alveolar edema remain without change. Consolidation in the left lower lobe remains as do bilateral effusions. Heart is prominent but stable. The pulmonary vascularity is normal. No  adenopathy is appreciable.  IMPRESSION: Widespread interstitial and patchy alveolar disease with effusions. The appearance is consistent with congestive heart failure. There may well be superimposed ARDS. There is subcutaneous air on the left but no demonstrable pneumothorax. Tube and catheter positions are as described.   Electronically Signed   By: Bretta Bang M.D.   On: 12/12/2013 07:23   Dg Chest Port 1 View  12/11/2013   CLINICAL DATA:  58 year old male with ARDS.  EXAM: PORTABLE CHEST - 1 VIEW  COMPARISON:  12/10/2013 and prior chest radiographs  FINDINGS: An endotracheal tube with tip 4.6 cm above the carina, a right IJ central venous catheter with tip overlying the mid SVC, and small bore feeding tube tip overlying the proximal stomach noted.  Bilateral airspace opacities/ edema again noted.  Left lower lung consolidations/ atelectasis again noted.  Small bilateral pleural effusions are unchanged.  An equivocal miniscule left apical pneumothorax may be present.  Bilateral subcutaneous emphysema and pneumomediastinum have decreased.  IMPRESSION: Equivocal miniscule left apical pneumothorax - not significantly changed.  Decreasing pneumomediastinum and subcutaneous emphysema.  Unchanged bilateral airspace opacities and left lower lung consolidations/atelectasis.  Small bore feeding tube with tip overlying the proximal stomach.   Electronically Signed   By: Laveda Abbe M.D.   On: 12/11/2013 07:22   Dg Chest  Port 1 View  12/10/2013   CLINICAL DATA:  Respiratory distress.  Left pneumothorax.  EXAM: PORTABLE CHEST - 1 VIEW  COMPARISON:  14-Dec-2013 and prior chest radiographs dating back to 10/04/2013  FINDINGS: The cardiomediastinal silhouette is stable.  An endotracheal tube with tip 5.6 cm above the carina, small bore feeding tube entering the stomach with tip off the field of view, and left IJ central venous catheter with tip overlying the mid SVC again noted.  A miniscule left apical pneumothorax is noted.  Increasing subcutaneous emphysema, left-greater-than-right, and pneumomediastinum identified.  Diffuse bilateral airspace opacities are present.  IMPRESSION: Miniscule left apical pneumothorax.  Increasing pneumomediastinum and bilateral subcutaneous emphysema, left-greater-than-right. Stable diffuse bilateral airspace opacities.   Electronically Signed   By: Laveda Abbe M.D.   On: 12/10/2013 12:36       ASSESSMENT / PLAN:  PULMONARY  A:  Acute Hypoxic Resp Failure/ARDS Sepsis\Cavitation on CT with pneumomediastium, concern for Fungal infection Small LUL PTX   - ARDS persists with vent dysnchrony though down to 50% fio2/peep 8. Off nimbex x 24h since 12/11/13     P:   ARDS protocol to continue Increase sedation, if unable to control dysnchrony on vent restart nimbex (to be decided 12/12/13 PM) If ARDS worsens consider prone vent protiocol Abx per. ID consult  PTX is small, but with hypoxia will have to increase PEEP and may place chest tube - for now will monitor with serial cxr and hemodynamics  CARDIOVASCULAR CVL:8/1 l i j cvl>> A:  Septic shock - levophed gtt weaned off 8/3 am , currently off pressors Hypotension - on versed and fentanyl   - off pressors 12/12/13 but bp soft P:  Fluid bolus and if no response, start pressors again Stress steroids in setting of chronic prednisone administration and steroid responsive lung disease, started weaning on 8/4 to 50 q6 hrs (previouly 100  q8hrs), will keep at 200 total since clinical status is worsening  RENAL A:   AKI - Likely secondary to volume depletion and hypotension, worsening.Normal renal US 12/11/13   - worsening renal function 12/12/13. Heading into ATN  P:   Fluid bolus 12/12/13 and  recheck bmet and abg at 4pm; call renal depending on course Trend BMP Monitor UOP   GASTROINTESTINAL A:   Vent associated Dysphagia  ? Aspiration component Increased Gastric Residual / ABD Distention Hypertriglyceridemia    - TF continue to be off for several days as of 12/12/13. Having normal gastric returns now. Sp UGI08/28/2015  scope with multiple gastric ulcers related to OG trauma with recommendation to use doboff    P:   Restart Tube feeds   DO NOT PLACE OGT TO SUCTION.   Use doboff PPI   HEMATOLOGIC A:   Thrombocytopenia - Mild - unknown etiology.  Noted since July 2015 Anemia Hgb 7.0 8/9 P:  Trend CBC, monitor platelets, avoid toxic drugs.  Transfuse for hgb <7.0   INFECTIOUS A:   HCAP Sputum 12/03/13 with Group A beta hemolytic strep, 12/07/13 with Klebsiella- in immunocompromised patient (Humira and prednisone), s/p bronchoscopy with BAL 12/26/2013 Per ID   P:   Rx HCAP now with Cavitary lesion, high concern for fungal infection  BCx2 7/31 >> ng UC 8/1 >> ng Sputum 8/5 >> K. pneumonia  Abx: ID directing zosyn, start date 8/1>>8/7,  Vanc, start date 8/5  Micafungin, start date 8/1>>8/7, Flagyl 8/7>> Cefepime 8/7>> Vfend 8/8>>   ENDOCRINE A:   DM II Chronic Steroid Administration   Proptosis - TSH nml  P:   SSI Tapering solu-cortef  Lacrilube for eye protection  NEUROLOGIC A:   AMS, No diprivan due to high TGL P:   RASS goal: -3 Daily WUA Propofol stopped due to high trigs Versed gtt Fentanyl gtt PRN versed Might need nimbex again    GLOBAL 12/12/13: No family at bedside. Full code  TODAY'S SUMMARY: 58 y/o with FTT, psoriatic arthritis admitted with HCAP, AKI, resolved septic shock,  RLL\RML cavitation with pneumomediastium on CT chest and LUL PTX on CXR, correcting Hyponatremia\dehydration, treating HCAP . On ARDS protocol . NMB stopped 8/9 and evaluate need. . Renal function continues to slowly decline. Abx per ID. Cytology from [facility] pending. Re-evaluate vent and renal function 12/12/13 PM    The patient is critically ill with multiple organ systems failure and requires high complexity decision making for assessment and support, frequent evaluation and titration of therapies, application of advanced monitoring technologies and extensive interpretation of multiple databases.   Critical Care Time devoted to patient care services described in this note is  35  Minutes.  Dr. Kalman Shan, M.D., [facility].C.P Pulmonary and Critical Care Medicine Staff Physician [facility] Pulmonary and Critical Care Pager: [phone], If no answer or between  15:00h - 7:00h: call [phone]  12/12/2013 11:47 AM

## 2013-12-12 NOTE — Progress Notes (Signed)
CRITICAL VALUE ALERT  Critical value received:  Calcium 6.4  Date of notification:  12/12/2013  Time of notification:  0350  Critical value read back:Yes.    Nurse who received alert:  Deniece Ree  MD notified (1st page):  Dr. Delford Field  Time of first page:  [phone]  MD notified (2nd page):  Time of second page:  Responding MD:  Dr. Delford Field  Time MD responded:  [phone]  Will continue to monitor

## 2013-12-12 NOTE — Progress Notes (Signed)
NUTRITION FOLLOW UP / CONSULT  Pt meets criteria for SEVERE MALNUTRITION in the context of chronic illness as evidenced by severe fat and muscle mass depletion.   DOCUMENTATION CODES  Per approved criteria   -Severe malnutrition in the context of chronic illness    Intervention:    Utilize 49M PEPuP Protocol: initiate TF via NGT with Oxepa at 25 ml/h and Prostat 30 ml TID on day 1; on day 2, increase to goal rate of 45 ml/h (1080 ml per day) to provide 1920 kcals, 113 gm protein, 848 ml free water daily.  If patient has intolerance to tube feeding, recommend place feeding tube in a post-pyloric position.  Nutrition Dx:   Inadequate oral intake related to inability to eat as evidenced by NPO status, ongoing.  Goal:   Intake to meet >90% of estimated nutrition needs, unmet.  Monitor:   TF tolerance/adequacy, weight trend, labs, vent status.  Assessment:   Pt with FTT, psoriatic arthritis admitted with HCAP, AKI, acute lung injury, and septic shock. Pt on chronic steroids. He also has a history of hypertension and diabetes that has been poorly controlled.   Discussed patient in ICU rounds today. Patient remains on the ARDS protocol. Small bore NGT in place for medication administration and enteral nutrition, not to be used for suction. Plans to initiate TF via NGT today, received MD Consult for TF initiation and management.  Patient is currently intubated on ventilator support, ARDS improved per discussion in ICU rounds. MV: 13.6 L/min Temp (24hrs), Avg:97.9 F (36.6 C), Min:96.9 F (36.1 C), Max:98.5 F (36.9 C)  Propofol: none  Height: Ht Readings from Last 1 Encounters:  11/05/13 5' 11.5" (1.816 m)    Weight Status:  Up with positive fluid status Wt Readings from Last 1 Encounters:  12/12/13 190 lb 7.6 oz (86.4 kg)  12/03/13  144 lb 13.5 oz (65.7 kg)   Re-estimated needs:  Kcal: 1832 Protein: 100-115 gm Fluid: 2 L  Skin: stage 2 pressure ulcer to buttocks  Diet  Order: NPO   Intake/Output Summary (Last 24 hours) at 12/12/13 1339 Last data filed at 12/12/13 1300  Gross per 24 hour  Intake 3272.98 ml  Output    228 ml  Net 3044.98 ml    Last BM: PTA   Labs:   Recent Labs Lab 12/07/13 1515  12/10/13 0524 12/11/13 0107 12/12/13 0400  NA 161*  < > 147 144 143  K 3.2*  < > 4.0 3.8 3.9  CL 126*  < > 113* 109 107  CO2 25  < > 23 23 20   BUN 24*  < > 28* 37* 50*  CREATININE 0.94  < > 1.51* 1.97* 2.61*  CALCIUM 6.9*  < > 6.5* 6.4* 6.6*  MG 1.9  --   --   --  1.5  PHOS 2.3  --   --   --  5.3*  GLUCOSE 181*  < > 156* 170* 115*  < > = values in this interval not displayed.  CBG (last 3)   Recent Labs  12/11/13 2358 12/12/13 0355 12/12/13 0803  GLUCAP 86 104* 127*    Scheduled Meds: . antiseptic oral rinse  7 mL Mouth Rinse QID  . artificial tears   Both Eyes 3 times per day  . ceFEPime (MAXIPIME) IV  1 g Intravenous Q24H  . chlorhexidine  15 mL Mouth Rinse BID  . famotidine (PEPCID) IV  20 mg Intravenous Q12H  . hydrocortisone sod succinate (SOLU-CORTEF) inj  50 mg Intravenous Q6H  . insulin aspart  2-6 Units Subcutaneous 6 times per day  . ipratropium-albuterol  3 mL Nebulization Q6H  . magnesium sulfate 1 - 4 g bolus IVPB  1 g Intravenous Once  . metronidazole  500 mg Intravenous Q8H  . voriconazole  300 mg Per Tube Q12H    Continuous Infusions: . dextrose 1,000 mL (12/12/13 0803)  . fentaNYL infusion INTRAVENOUS 300 mcg/hr (12/12/13 1200)  . midazolam (VERSED) infusion 4 mg/hr (12/12/13 1308)  . phenylephrine (NEO-SYNEPHRINE) Adult infusion Stopped (12/10/13 1128)    Joaquin Courts, RD, LDN, CNSC Pager [phone] After Hours Pager [phone]

## 2013-12-12 NOTE — Progress Notes (Signed)
Patient ID: Lawrence Jennings, male   DOB: 17-Apr-1956, 58 y.o.   MRN: [id number]         [facility] for Infectious Disease    Date of Admission:  11/26/2013   Total days of antibiotics 11        Day 2 cefepime        Day 2 metronidazole        Day 2 voriconazole  Active Problems:   Septic shock   . antiseptic oral rinse  7 mL Mouth Rinse QID  . artificial tears   Both Eyes 3 times per day  . ceFEPime (MAXIPIME) IV  1 g Intravenous Q24H  . chlorhexidine  15 mL Mouth Rinse BID  . famotidine (PEPCID) IV  20 mg Intravenous Q12H  . hydrocortisone sod succinate (SOLU-CORTEF) inj  50 mg Intravenous Q6H  . insulin aspart  2-6 Units Subcutaneous 6 times per day  . ipratropium-albuterol  3 mL Nebulization Q6H  . metronidazole  500 mg Intravenous Q8H  . voriconazole  300 mg Per Tube Q12H    Objective: Temp:  [96.5 F (35.8 C)-98.5 F (36.9 C)] 97.7 F (36.5 C) (08/10 0827) Pulse Rate:  [97-124] 117 (08/10 1100) Resp:  [30-34] 34 (08/10 0753) BP: (83-111)/(45-84) 98/66 mmHg (08/10 1100) SpO2:  [88 %-100 %] 94 % (08/10 1100) Arterial Line BP: (90-127)/(45-66) 96/46 mmHg (08/10 1100) FiO2 (%):  [50 %] 50 % (08/10 0800) Weight:  [190 lb 7.6 oz (86.4 kg)] 190 lb 7.6 oz (86.4 kg) (08/10 0500)  General: Off paralytics, sedated on the ventilator Skin: Dry skin Lungs: Rhonchi Cor: Regular S1 and S2 no murmur Abdomen: Soft  Lab Results Lab Results  Component Value Date   WBC 13.7* 12/12/2013   HGB 7.1* 12/12/2013   HCT 22.0* 12/12/2013   MCV 84.9 12/12/2013   PLT 51* 12/12/2013    Lab Results  Component Value Date   CREATININE 2.61* 12/12/2013   BUN 50* 12/12/2013   NA 143 12/12/2013   K 3.9 12/12/2013   CL 107 12/12/2013   CO2 20 12/12/2013    Lab Results  Component Value Date   ALT 12 12/08/2013   AST 92* 12/08/2013   ALKPHOS 154* 12/08/2013   BILITOT 0.5 12/08/2013      Microbiology: Recent Results (from the past 240 hour(s))  CULTURE, BLOOD (ROUTINE X 2)     Status: None   Collection Time    11/23/2013  9:39 PM      Result Value Ref Range Status   Specimen Description BLOOD LEFT ARM   Final   Special Requests BOTTLES DRAWN AEROBIC ONLY 2CC   Final   Culture  Setup Time     Final   Value: 12/03/2013 03:43     Performed at [facility]   Culture     Final   Value: NO GROWTH 5 DAYS     Performed at [facility]   Report Status 12/28/2013 FINAL   Final  CULTURE, BLOOD (ROUTINE X 2)     Status: None   Collection Time    11/03/2013  9:57 PM      Result Value Ref Range Status   Specimen Description BLOOD RIGHT FEMORAL ARTERY   Final   Special Requests BOTTLES DRAWN AEROBIC AND ANAEROBIC [facility] EACH   Final   Culture  Setup Time     Final   Value: 12/03/2013 03:43     Performed at [facility]  Final   Value: NO GROWTH 5 DAYS     Performed at [facility]   Report Status 12/15/2013 FINAL   Final  URINE CULTURE     Status: None   Collection Time    12/03/13  2:07 AM      Result Value Ref Range Status   Specimen Description URINE, RANDOM   Final   Special Requests NONE   Final   Culture  Setup Time     Final   Value: 12/03/2013 12:35     Performed at [facility] Count     Final   Value: NO GROWTH     Performed at [facility]   Culture     Final   Value: NO GROWTH     Performed at [facility]   Report Status 12/04/2013 FINAL   Final  RAPID STREP SCREEN     Status: None   Collection Time    12/03/13  2:08 AM      Result Value Ref Range Status   Streptococcus, Group A Screen (Direct) NEGATIVE  NEGATIVE Final   Comment: (NOTE)     A Rapid Antigen test may result negative if the antigen level in the     sample is below the detection level of this test. The FDA has not     cleared this test as a stand-alone test therefore the rapid antigen     negative result has reflexed to a Group A Strep culture.  CULTURE, GROUP A STREP     Status: None   Collection Time    12/03/13  2:08  AM      Result Value Ref Range Status   Specimen Description THROAT   Final   Special Requests NONE   Final   Culture     Final   Value: STREPTOCOCCUS,BETA HEMOLYTIC NOT GROUP A     Performed at [facility]   Report Status 12/05/2013 FINAL   Final  MRSA PCR SCREENING     Status: None   Collection Time    12/03/13  2:38 AM      Result Value Ref Range Status   MRSA by PCR NEGATIVE  NEGATIVE Final   Comment:            The GeneXpert MRSA Assay (FDA     approved for NASAL specimens     only), is one component of a     comprehensive MRSA colonization     surveillance program. It is not     intended to diagnose MRSA     infection nor to guide or     monitor treatment for     MRSA infections.  CULTURE, RESPIRATORY (NON-EXPECTORATED)     Status: None   Collection Time    12/07/13 12:31 PM      Result Value Ref Range Status   Specimen Description TRACHEAL ASPIRATE   Final   Special Requests Immunocompromised   Final   Gram Stain     Final   Value: FEW WBC PRESENT,BOTH PMN AND MONONUCLEAR     RARE SQUAMOUS EPITHELIAL CELLS PRESENT     NO ORGANISMS SEEN     Performed at [facility]   Culture     Final   Value: FEW KLEBSIELLA PNEUMONIAE     Performed at [facility]   Report Status 12/29/2013 FINAL   Final   Organism ID, Bacteria KLEBSIELLA PNEUMONIAE   Final  PNEUMOCYSTIS JIROVECI SMEAR BY DFA     Status: None   Collection Time    12/07/13 12:38 PM      Result Value Ref Range Status   Specimen Source-PJSRC ENDOTRACHEAL   Final   Pneumocystis jiroveci Ag NEGATIVE   Final   Comment: Performed at [facility]  CULTURE, BAL-QUANTITATIVE     Status: None   Collection Time    12/31/2013 12:17 PM      Result Value Ref Range Status   Specimen Description BRONCHIAL ALVEOLAR LAVAGE   Final   Special Requests Immunocompromised   Final   Gram Stain     Final   Value: FEW WBC PRESENT,BOTH PMN AND MONONUCLEAR     NO SQUAMOUS EPITHELIAL CELLS SEEN      NO ORGANISMS SEEN     Performed at [facility] Count     Final   Value: 10,000 COLONIES/ML     Performed at [facility]   Culture     Final   Value: Non-Pathogenic Oropharyngeal-type Flora Isolated.     Performed at [facility]   Report Status 12/11/2013 FINAL   Final  FUNGUS CULTURE W SMEAR     Status: None   Collection Time    12/26/2013 12:21 PM      Result Value Ref Range Status   Specimen Description BRONCHIAL ALVEOLAR LAVAGE   Final   Special Requests Immunocompromised   Final   Fungal Smear     Final   Value: NO YEAST OR FUNGAL ELEMENTS SEEN     Performed at [facility]   Culture     Final   Value: CULTURE IN PROGRESS FOR FOUR WEEKS     Performed at [facility]   Report Status PENDING   Incomplete    Studies/Results: US Abdomen Port  12/11/2013   CLINICAL DATA:  58 year old male with renal failure, sepsis and thrombocytopenia  EXAM: ULTRASOUND PORTABLE ABDOMEN  COMPARISON:  12/07/2013 CT  FINDINGS: Gallbladder:  Gallbladder sludge identified. There is no evidence of cholelithiasis or acute cholecystitis.  Common bile duct:  Diameter: 4.4 mm. There is no evidence of intrahepatic or extrahepatic biliary dilatation.  Liver:  No focal lesion identified. Within normal limits in parenchymal echogenicity.  IVC:  No abnormality visualized.  Pancreas:  Visualized portion unremarkable.  Spleen:  Size and appearance within normal limits.  Right Kidney:  Length: 12.9 cm. Echogenicity is upper limits of normal. There is no evidence of solid mass or hydronephrosis.  Left Kidney:  Length: 13 cm. Echogenicity is upper limits of normal. There is no evidence of solid mass or hydronephrosis.  Abdominal aorta:  No aneurysm visualized.  Other findings:  Small bilateral pleural effusions and small amount and ascites noted.  IMPRESSION: Gallbladder sludge without cholelithiasis or evidence of acute cholecystitis.  Small bilateral pleural effusions of  small amount of ascites.  Upper limits of normal renal echogenicity which can be seen with medical renal disease.   Electronically Signed   By: Laveda Abbe M.D.   On: 12/11/2013 12:41   Dg Chest Port 1 View  12/12/2013   CLINICAL DATA:  Difficulty breathing  EXAM: PORTABLE CHEST - 1 VIEW  COMPARISON:  December 11, 2012  FINDINGS: Endotracheal tube tip is 4.3 cm above the carina. Feeding tube tip extends below the diaphragm. Central catheter tip is in the superior vena cava. No pneumothorax is discernible on this study. Note that there  is subcutaneous air on the left, however.  Widespread interstitial and patchy alveolar edema remain without change. Consolidation in the left lower lobe remains as do bilateral effusions. Heart is prominent but stable. The pulmonary vascularity is normal. No adenopathy is appreciable.  IMPRESSION: Widespread interstitial and patchy alveolar disease with effusions. The appearance is consistent with congestive heart failure. There may well be superimposed ARDS. There is subcutaneous air on the left but no demonstrable pneumothorax. Tube and catheter positions are as described.   Electronically Signed   By: Bretta Bang M.D.   On: 12/12/2013 07:23   Dg Chest Port 1 View  12/11/2013   CLINICAL DATA:  58 year old male with ARDS.  EXAM: PORTABLE CHEST - 1 VIEW  COMPARISON:  12/10/2013 and prior chest radiographs  FINDINGS: An endotracheal tube with tip 4.6 cm above the carina, a right IJ central venous catheter with tip overlying the mid SVC, and small bore feeding tube tip overlying the proximal stomach noted.  Bilateral airspace opacities/ edema again noted.  Left lower lung consolidations/ atelectasis again noted.  Small bilateral pleural effusions are unchanged.  An equivocal miniscule left apical pneumothorax may be present.  Bilateral subcutaneous emphysema and pneumomediastinum have decreased.  IMPRESSION: Equivocal miniscule left apical pneumothorax - not significantly changed.   Decreasing pneumomediastinum and subcutaneous emphysema.  Unchanged bilateral airspace opacities and left lower lung consolidations/atelectasis.  Small bore feeding tube with tip overlying the proximal stomach.   Electronically Signed   By: Laveda Abbe M.D.   On: 12/11/2013 07:22   Dg Chest Port 1 View  12/10/2013   CLINICAL DATA:  Respiratory distress.  Left pneumothorax.  EXAM: PORTABLE CHEST - 1 VIEW  COMPARISON:  Dec 21, 2013 and prior chest radiographs dating back to 10/04/2013  FINDINGS: The cardiomediastinal silhouette is stable.  An endotracheal tube with tip 5.6 cm above the carina, small bore feeding tube entering the stomach with tip off the field of view, and left IJ central venous catheter with tip overlying the mid SVC again noted.  A miniscule left apical pneumothorax is noted.  Increasing subcutaneous emphysema, left-greater-than-right, and pneumomediastinum identified.  Diffuse bilateral airspace opacities are present.  IMPRESSION: Miniscule left apical pneumothorax.  Increasing pneumomediastinum and bilateral subcutaneous emphysema, left-greater-than-right. Stable diffuse bilateral airspace opacities.   Electronically Signed   By: Laveda Abbe M.D.   On: 12/10/2013 12:36    Assessment: He is immunosuppressed on steroids and Humira. He has bilateral pulmonary infiltrates with a large thickwalled cavity on the right. I will continue his current broad antibacterial and antifungal therapy.  Plan: 1. Continue current antimicrobial therapy  Cliffton Asters, MD [facility] for Infectious Disease [facility] [phone] pager   [phone] cell 12/12/2013, 11:36 AM

## 2013-12-12 NOTE — Progress Notes (Signed)
Reviewed ABG and BMET results with Dr. Marchelle Gearing. Increasing creatine and low CVP. Verbal orders for additional 1000 cc bolus now and BMET for 2300. Will continue to monitor.

## 2013-12-13 ENCOUNTER — Inpatient Hospital Stay (HOSPITAL_COMMUNITY): Payer: Medicaid Other

## 2013-12-13 DIAGNOSIS — B3789 Other sites of candidiasis: Secondary | ICD-10-CM

## 2013-12-13 DIAGNOSIS — M129 Arthropathy, unspecified: Secondary | ICD-10-CM

## 2013-12-13 DIAGNOSIS — B961 Klebsiella pneumoniae [K. pneumoniae] as the cause of diseases classified elsewhere: Secondary | ICD-10-CM

## 2013-12-13 DIAGNOSIS — R093 Abnormal sputum: Secondary | ICD-10-CM

## 2013-12-13 LAB — BASIC METABOLIC PANEL
ANION GAP: 15 (ref 5–15)
BUN: 57 mg/dL — AB (ref 6–23)
CHLORIDE: 104 meq/L (ref 96–112)
CO2: 18 mEq/L — ABNORMAL LOW (ref 19–32)
Calcium: 6.2 mg/dL — CL (ref 8.4–10.5)
Creatinine, Ser: 2.81 mg/dL — ABNORMAL HIGH (ref 0.50–1.35)
GFR, EST AFRICAN AMERICAN: 27 mL/min — AB (ref >90)
GFR, EST NON AFRICAN AMERICAN: 24 mL/min — AB (ref >90)
Glucose, Bld: 142 mg/dL — ABNORMAL HIGH (ref 70–99)
POTASSIUM: 3.7 meq/L (ref 3.7–5.3)
SODIUM: 137 meq/L (ref 137–147)

## 2013-12-13 LAB — BLOOD GAS, ARTERIAL
Acid-base deficit: 8.2 mmol/L — ABNORMAL HIGH (ref 0.0–2.0)
Bicarbonate: 18.1 mEq/L — ABNORMAL LOW (ref 20.0–24.0)
Drawn by: 419771
FIO2: 0.5 %
O2 Saturation: 91.7 %
PATIENT TEMPERATURE: 98.6
PEEP: 8 cmH2O
PH ART: 7.225 — AB (ref 7.350–7.450)
RATE: 30 resp/min
TCO2: 19.4 mmol/L (ref 0–100)
VT: 450 mL
pCO2 arterial: 45.2 mmHg — ABNORMAL HIGH (ref 35.0–45.0)
pO2, Arterial: 71 mmHg — ABNORMAL LOW (ref 80.0–100.0)

## 2013-12-13 LAB — GLUCOSE, CAPILLARY
GLUCOSE-CAPILLARY: 133 mg/dL — AB (ref 70–99)
Glucose-Capillary: 129 mg/dL — ABNORMAL HIGH (ref 70–99)
Glucose-Capillary: 134 mg/dL — ABNORMAL HIGH (ref 70–99)
Glucose-Capillary: 146 mg/dL — ABNORMAL HIGH (ref 70–99)
Glucose-Capillary: 148 mg/dL — ABNORMAL HIGH (ref 70–99)
Glucose-Capillary: 151 mg/dL — ABNORMAL HIGH (ref 70–99)

## 2013-12-13 LAB — URINALYSIS, ROUTINE W REFLEX MICROSCOPIC
Bilirubin Urine: NEGATIVE
GLUCOSE, UA: 100 mg/dL — AB
Ketones, ur: NEGATIVE mg/dL
Leukocytes, UA: NEGATIVE
Nitrite: NEGATIVE
PH: 5 (ref 5.0–8.0)
PROTEIN: 30 mg/dL — AB
Specific Gravity, Urine: 1.012 (ref 1.005–1.030)
Urobilinogen, UA: 0.2 mg/dL (ref 0.0–1.0)

## 2013-12-13 LAB — CBC WITH DIFFERENTIAL/PLATELET
BASOS ABS: 0 10*3/uL (ref 0.0–0.1)
Basophils Relative: 0 % (ref 0–1)
Eosinophils Absolute: 0 10*3/uL (ref 0.0–0.7)
Eosinophils Relative: 0 % (ref 0–5)
HCT: 22.1 % — ABNORMAL LOW (ref 39.0–52.0)
Hemoglobin: 7.3 g/dL — ABNORMAL LOW (ref 13.0–17.0)
LYMPHS PCT: 1 % — AB (ref 12–46)
Lymphs Abs: 0.1 10*3/uL — ABNORMAL LOW (ref 0.7–4.0)
MCH: 28 pg (ref 26.0–34.0)
MCHC: 33 g/dL (ref 30.0–36.0)
MCV: 84.7 fL (ref 78.0–100.0)
Monocytes Absolute: 0.2 10*3/uL (ref 0.1–1.0)
Monocytes Relative: 1 % — ABNORMAL LOW (ref 3–12)
NEUTROS ABS: 17.5 10*3/uL — AB (ref 1.7–7.7)
NEUTROS PCT: 98 % — AB (ref 43–77)
PLATELETS: 64 10*3/uL — AB (ref 150–400)
RBC: 2.61 MIL/uL — ABNORMAL LOW (ref 4.22–5.81)
RDW: 19.1 % — AB (ref 11.5–15.5)
WBC: 17.9 10*3/uL — AB (ref 4.0–10.5)

## 2013-12-13 LAB — SODIUM, URINE, RANDOM: SODIUM UR: 32 meq/L

## 2013-12-13 LAB — URINE MICROSCOPIC-ADD ON

## 2013-12-13 LAB — MAGNESIUM: MAGNESIUM: 1.8 mg/dL (ref 1.5–2.5)

## 2013-12-13 LAB — CREATININE, URINE, RANDOM: CREATININE, URINE: 19.1 mg/dL

## 2013-12-13 LAB — PROTEIN / CREATININE RATIO, URINE
CREATININE, URINE: 19.35 mg/dL
PROTEIN CREATININE RATIO: 3 — AB (ref 0.00–0.15)
Total Protein, Urine: 58 mg/dL

## 2013-12-13 LAB — VANCOMYCIN, RANDOM: Vancomycin Rm: 43 ug/mL

## 2013-12-13 LAB — PHOSPHORUS: PHOSPHORUS: 5.4 mg/dL — AB (ref 2.3–4.6)

## 2013-12-13 LAB — CALCIUM, IONIZED: Calcium, Ion: 0.95 mmol/L — ABNORMAL LOW (ref 1.12–1.23)

## 2013-12-13 MED ORDER — METOCLOPRAMIDE HCL 5 MG/ML IJ SOLN
10.0000 mg | Freq: Once | INTRAMUSCULAR | Status: AC
  Administered 2013-12-13: 10 mg via INTRAVENOUS
  Filled 2013-12-13: qty 2

## 2013-12-13 MED ORDER — FUROSEMIDE 10 MG/ML IJ SOLN
160.0000 mg | Freq: Four times a day (QID) | INTRAVENOUS | Status: AC
  Administered 2013-12-13 – 2013-12-14 (×3): 160 mg via INTRAVENOUS
  Filled 2013-12-13 (×3): qty 16

## 2013-12-13 MED ORDER — CISATRACURIUM BOLUS VIA INFUSION
0.1000 mg/kg | Freq: Once | INTRAVENOUS | Status: DC
  Filled 2013-12-13: qty 10

## 2013-12-13 MED ORDER — SODIUM CHLORIDE 0.9 % IV SOLN
3.0000 ug/kg/min | INTRAVENOUS | Status: DC
  Filled 2013-12-13: qty 20

## 2013-12-13 MED ORDER — METOCLOPRAMIDE HCL 5 MG/ML IJ SOLN
5.0000 mg | Freq: Three times a day (TID) | INTRAMUSCULAR | Status: DC
Start: 2013-12-13 — End: 2013-12-14
  Administered 2013-12-13 – 2013-12-14 (×3): 5 mg via INTRAVENOUS
  Filled 2013-12-13 (×6): qty 1

## 2013-12-13 MED ORDER — IOHEXOL 300 MG/ML  SOLN
50.0000 mL | Freq: Once | INTRAMUSCULAR | Status: AC | PRN
  Administered 2013-12-13: 30 mL

## 2013-12-13 MED ORDER — ARTIFICIAL TEARS OP OINT: 1.0000 "application " | TOPICAL_OINTMENT | Freq: Three times a day (TID) | OPHTHALMIC | Status: DC

## 2013-12-13 MED ORDER — HYDROCORTISONE NA SUCCINATE PF 100 MG IJ SOLR
50.0000 mg | Freq: Two times a day (BID) | INTRAMUSCULAR | Status: DC
  Administered 2013-12-13 – 2013-12-16 (×7): 50 mg via INTRAVENOUS
  Filled 2013-12-13 (×10): qty 1

## 2013-12-13 MED ORDER — FAMOTIDINE IN NACL 20-0.9 MG/50ML-% IV SOLN
20.0000 mg | INTRAVENOUS | Status: DC
  Administered 2013-12-14 – 2013-12-16 (×3): 20 mg via INTRAVENOUS
  Filled 2013-12-13 (×4): qty 50

## 2013-12-13 MED ORDER — METOCLOPRAMIDE HCL 5 MG/ML IJ SOLN
10.0000 mg | Freq: Three times a day (TID) | INTRAMUSCULAR | Status: DC
  Administered 2013-12-13: 10 mg via INTRAVENOUS
  Filled 2013-12-13 (×5): qty 2

## 2013-12-13 MED ORDER — DEXTROSE-NACL 5-0.45 % IV SOLN
INTRAVENOUS | Status: DC
  Administered 2013-12-13: 09:00:00 via INTRAVENOUS

## 2013-12-13 NOTE — Progress Notes (Signed)
D12 ventilator High morbidity and mortality situation  Plan D./w sister Zella Ball - she is ok for palliative care goals of care meeting prior to commmitting to trach. Other sisters will need to be involved   Dr. Kalman Shan, M.D., [facility].C.P Pulmonary and Critical Care Medicine Staff Physician [facility] [place] [facility] Pager: [phone], If no answer or between  15:00h - 7:00h: call [phone]  12/13/2013 2:23 PM

## 2013-12-13 NOTE — Consult Note (Signed)
Reason for Consult:Rising Cr, acidemia Referring Physician: Dr Joycelyn Rua Gopal is an 58 y.o. male.  HPI: 58 yr male with Psoriasis, Psoriatic arthritis, recently will subacute FTT.  Admitted 12/03/13  With sepsis, ? HCAP, cavitary lung lesion,  In setting of Immunosuppression with Humira, Pred.  Only Strep cultured from sputum.  On Vent since 8/1.  On pressors until 3 d ago also.  Cr 1.5 on admit and fell to .9, but starting on 8/7 has been rising and is now 2.8 (little change in past 3 d).  On Vanc, Zosyn, Cefapine, Micafungin, and Voriconazole this hosp.  Bicarb fall has paralled rise in Cr and is now 18 with AG 15, stable.  Multiple e'lyte abn this hosp, now K and Na ok.  Other prob include poorly controlled DM, ^ lipids.  Now has ileus also.  Wgt up 28 kg this hosp.and pos fluid balance over 23 liters.  Oliguric x 48h now.  Has also been on PPI up until 2 d ago.   Vanc levels 40-50 in last 5 d, now off Vanc. Review of systems not obtained due to patient factors.        Past Medical History  Diagnosis Date  . Allergic reaction   . Sinus disease   . Back pain   . Arthritis   . Edema   . Hypertension   . Diabetes mellitus without complication     insulin/metformin    Past Surgical History  Procedure Laterality Date  . No past surgeries    . Esophagogastroduodenoscopy N/A 01/01/2014    Procedure: ESOPHAGOGASTRODUODENOSCOPY (EGD);  Surgeon: Beryle Beams, MD;  Location: [facility] ENDOSCOPY;  Service: Endoscopy;  Laterality: N/A;    History reviewed. No pertinent family history.  Social History:  reports that he quit smoking about 2 months ago. His smoking use included Cigarettes. He has a 5 pack-year smoking history. He has never used smokeless tobacco. He reports that he does not drink alcohol or use illicit drugs.  Allergies: No Known Allergies  Medications:  I have reviewed the patient's current medications. Prior to Admission:  Prescriptions prior to admission   Medication Sig Dispense Refill  . Adalimumab (HUMIRA) 40 MG/0.8ML PSKT Inject 40 mg into the skin every Wednesday.       . enalapril (VASOTEC) 2.5 MG tablet Take 1.25 mg by mouth daily.      . insulin aspart (NOVOLOG) 100 UNIT/ML injection Inject 0-15 Units into the skin 3 (three) times daily with meals.  10 mL  0  . insulin glargine (LANTUS) 100 UNIT/ML injection Inject 0.15 mLs (15 Units total) into the skin daily.  10 mL  11  . metFORMIN (GLUCOPHAGE) 500 MG tablet Take 1 tablet (500 mg total) by mouth daily with breakfast.  30 tablet  3  . oxyCODONE 10 MG TABS Take 1 tablet (10 mg total) by mouth every 4 (four) hours as needed for severe pain.  30 tablet  0  . simethicone (MYLICON) 80 MG chewable tablet Chew 1 tablet (80 mg total) by mouth every 6 (six) hours as needed for flatulence.  30 tablet  0     Results for orders placed during the hospital encounter of 11/18/2013 (from the past 48 hour(s))  GLUCOSE, CAPILLARY     Status: Abnormal   Collection Time    12/11/13  3:43 PM      Result Value Ref Range   Glucose-Capillary 101 (*) 70 - 99 mg/dL  GLUCOSE, CAPILLARY  Status: None   Collection Time    12/11/13  7:35 PM      Result Value Ref Range   Glucose-Capillary 71  70 - 99 mg/dL  GLUCOSE, CAPILLARY     Status: None   Collection Time    12/11/13 11:58 PM      Result Value Ref Range   Glucose-Capillary 86  70 - 99 mg/dL  GLUCOSE, CAPILLARY     Status: Abnormal   Collection Time    12/12/13  3:55 AM      Result Value Ref Range   Glucose-Capillary 104 (*) 70 - 99 mg/dL   Comment 1 Notify RN    CBC     Status: Abnormal   Collection Time    12/12/13  4:00 AM      Result Value Ref Range   WBC 13.7 (*) 4.0 - 10.5 K/uL   RBC 2.59 (*) 4.22 - 5.81 MIL/uL   Hemoglobin 7.1 (*) 13.0 - 17.0 g/dL   HCT 22.0 (*) 39.0 - 52.0 %   MCV 84.9  78.0 - 100.0 fL   MCH 27.4  26.0 - 34.0 pg   MCHC 32.3  30.0 - 36.0 g/dL   RDW 19.1 (*) 11.5 - 15.5 %   Platelets 51 (*) 150 - 400 K/uL    Comment: REPEATED TO VERIFY     PLATELET COUNT CONFIRMED BY SMEAR  BASIC METABOLIC PANEL     Status: Abnormal   Collection Time    12/12/13  4:00 AM      Result Value Ref Range   Sodium 143  137 - 147 mEq/L   Potassium 3.9  3.7 - 5.3 mEq/L   Chloride 107  96 - 112 mEq/L   CO2 20  19 - 32 mEq/L   Glucose, Bld 115 (*) 70 - 99 mg/dL   BUN 50 (*) 6 - 23 mg/dL   Creatinine, Ser 2.61 (*) 0.50 - 1.35 mg/dL   Calcium 6.6 (*) 8.4 - 10.5 mg/dL   GFR calc non Af Amer 26 (*) >90 mL/min   GFR calc Af Amer 30 (*) >90 mL/min   Comment: (NOTE)     The eGFR has been calculated using the CKD EPI equation.     This calculation has not been validated in all clinical situations.     eGFR's persistently <90 mL/min signify possible Chronic Kidney     Disease.   Anion gap 16 (*) 5 - 15  PHOSPHORUS     Status: Abnormal   Collection Time    12/12/13  4:00 AM      Result Value Ref Range   Phosphorus 5.3 (*) 2.3 - 4.6 mg/dL  MAGNESIUM     Status: None   Collection Time    12/12/13  4:00 AM      Result Value Ref Range   Magnesium 1.5  1.5 - 2.5 mg/dL  PROTIME-INR     Status: Abnormal   Collection Time    12/12/13  4:00 AM      Result Value Ref Range   Prothrombin Time 17.8 (*) 11.6 - 15.2 seconds   INR 1.47  0.00 - 1.49  APTT     Status: Abnormal   Collection Time    12/12/13  4:00 AM      Result Value Ref Range   aPTT 38 (*) 24 - 37 seconds   Comment:            IF BASELINE aPTT IS ELEVATED,  SUGGEST PATIENT RISK ASSESSMENT     BE USED TO DETERMINE APPROPRIATE     ANTICOAGULANT THERAPY.  GLUCOSE, CAPILLARY     Status: Abnormal   Collection Time    12/12/13  8:03 AM      Result Value Ref Range   Glucose-Capillary 127 (*) 70 - 99 mg/dL  POCT I-STAT 3, ART BLOOD GAS (G3+)     Status: Abnormal   Collection Time    12/12/13  8:07 AM      Result Value Ref Range   pH, Arterial 7.323 (*) 7.350 - 7.450   pCO2 arterial 38.7  35.0 - 45.0 mmHg   pO2, Arterial 58.0 (*) 80.0 - 100.0 mmHg    Bicarbonate 20.2  20.0 - 24.0 mEq/L   TCO2 21  0 - 100 mmol/L   O2 Saturation 89.0     Acid-base deficit 5.0 (*) 0.0 - 2.0 mmol/L   Patient temperature 97.6 F     Collection site ARTERIAL LINE     Drawn by RT     Sample type ARTERIAL    GLUCOSE, CAPILLARY     Status: Abnormal   Collection Time    12/12/13 12:02 PM      Result Value Ref Range   Glucose-Capillary 145 (*) 70 - 99 mg/dL  POCT I-STAT 3, ART BLOOD GAS (G3+)     Status: Abnormal   Collection Time    12/12/13  3:02 PM      Result Value Ref Range   pH, Arterial 7.293 (*) 7.350 - 7.450   pCO2 arterial 40.1  35.0 - 45.0 mmHg   pO2, Arterial 50.0 (*) 80.0 - 100.0 mmHg   Bicarbonate 19.6 (*) 20.0 - 24.0 mEq/L   TCO2 21  0 - 100 mmol/L   O2 Saturation 82.0     Acid-base deficit 7.0 (*) 0.0 - 2.0 mmol/L   Patient temperature 97.3 F     Collection site ARTERIAL LINE     Drawn by RT     Sample type ARTERIAL    BASIC METABOLIC PANEL     Status: Abnormal   Collection Time    12/12/13  3:04 PM      Result Value Ref Range   Sodium 141  137 - 147 mEq/L   Potassium 4.0  3.7 - 5.3 mEq/L   Chloride 107  96 - 112 mEq/L   CO2 19  19 - 32 mEq/L   Glucose, Bld 150 (*) 70 - 99 mg/dL   BUN 55 (*) 6 - 23 mg/dL   Creatinine, Ser 2.80 (*) 0.50 - 1.35 mg/dL   Calcium 6.4 (*) 8.4 - 10.5 mg/dL   Comment: CRITICAL RESULT CALLED TO, READ BACK BY AND VERIFIED WITH:     M MEALE,RN 1551 12/12/13 WBOND   GFR calc non Af Amer 24 (*) >90 mL/min   GFR calc Af Amer 27 (*) >90 mL/min   Comment: (NOTE)     The eGFR has been calculated using the CKD EPI equation.     This calculation has not been validated in all clinical situations.     eGFR's persistently <90 mL/min signify possible Chronic Kidney     Disease.   Anion gap 15  5 - 15  GLUCOSE, CAPILLARY     Status: Abnormal   Collection Time    12/12/13  4:52 PM      Result Value Ref Range   Glucose-Capillary 143 (*) 70 - 99 mg/dL  GLUCOSE, CAPILLARY  Status: Abnormal   Collection Time     12/12/13  7:44 PM      Result Value Ref Range   Glucose-Capillary 153 (*) 70 - 99 mg/dL  BASIC METABOLIC PANEL     Status: Abnormal   Collection Time    12/12/13  9:57 PM      Result Value Ref Range   Sodium 139  137 - 147 mEq/L   Potassium 3.7  3.7 - 5.3 mEq/L   Chloride 105  96 - 112 mEq/L   CO2 18 (*) 19 - 32 mEq/L   Glucose, Bld 149 (*) 70 - 99 mg/dL   BUN 56 (*) 6 - 23 mg/dL   Creatinine, Ser 2.77 (*) 0.50 - 1.35 mg/dL   Calcium 6.2 (*) 8.4 - 10.5 mg/dL   Comment: CRITICAL RESULT CALLED TO, READ BACK BY AND VERIFIED WITH:     G HARCUK,RN 2247 12/12/13 WBOND   GFR calc non Af Amer 24 (*) >90 mL/min   GFR calc Af Amer 28 (*) >90 mL/min   Comment: (NOTE)     The eGFR has been calculated using the CKD EPI equation.     This calculation has not been validated in all clinical situations.     eGFR's persistently <90 mL/min signify possible Chronic Kidney     Disease.   Anion gap 16 (*) 5 - 15  BLOOD GAS, ARTERIAL     Status: Abnormal   Collection Time    12/12/13 10:05 PM      Result Value Ref Range   FIO2 0.50     Delivery systems VENTILATOR     Mode PRESSURE REGULATED VOLUME CONTROL     VT 450     Rate 30     Peep/cpap 8.0     pH, Arterial 7.215 (*) 7.350 - 7.450   pCO2 arterial 47.7 (*) 35.0 - 45.0 mmHg   pO2, Arterial 69.5 (*) 80.0 - 100.0 mmHg   Bicarbonate 18.6 (*) 20.0 - 24.0 mEq/L   TCO2 20.1  0 - 100 mmol/L   Acid-base deficit 7.8 (*) 0.0 - 2.0 mmol/L   O2 Saturation 91.2     Patient temperature 98.6     Collection site A-LINE     Drawn by [phone]     Sample type ARTERIAL DRAW    GLUCOSE, CAPILLARY     Status: Abnormal   Collection Time    12/13/13 12:07 AM      Result Value Ref Range   Glucose-Capillary 129 (*) 70 - 99 mg/dL  VANCOMYCIN, RANDOM     Status: None   Collection Time    12/13/13  2:38 AM      Result Value Ref Range   Vancomycin Rm 43.0     Comment:            Random Vancomycin therapeutic     range is dependent on dosage and     time of  specimen collection.     A peak range is 20.0-40.0 ug/mL     A trough range is 5.0-15.0 ug/mL             BASIC METABOLIC PANEL     Status: Abnormal   Collection Time    12/13/13  2:38 AM      Result Value Ref Range   Sodium 137  137 - 147 mEq/L   Potassium 3.7  3.7 - 5.3 mEq/L   Chloride 104  96 - 112 mEq/L   CO2 18 (*)  19 - 32 mEq/L   Glucose, Bld 142 (*) 70 - 99 mg/dL   BUN 57 (*) 6 - 23 mg/dL   Creatinine, Ser 2.81 (*) 0.50 - 1.35 mg/dL   Calcium 6.2 (*) 8.4 - 10.5 mg/dL   Comment: CRITICAL RESULT CALLED TO, READ BACK BY AND VERIFIED WITH:     SPERRY A,RN 12/13/13 0318 WAYK   GFR calc non Af Amer 24 (*) >90 mL/min   GFR calc Af Amer 27 (*) >90 mL/min   Comment: (NOTE)     The eGFR has been calculated using the CKD EPI equation.     This calculation has not been validated in all clinical situations.     eGFR's persistently <90 mL/min signify possible Chronic Kidney     Disease.   Anion gap 15  5 - 15  PHOSPHORUS     Status: Abnormal   Collection Time    12/13/13  2:38 AM      Result Value Ref Range   Phosphorus 5.4 (*) 2.3 - 4.6 mg/dL  MAGNESIUM     Status: None   Collection Time    12/13/13  2:38 AM      Result Value Ref Range   Magnesium 1.8  1.5 - 2.5 mg/dL  CBC WITH DIFFERENTIAL     Status: Abnormal   Collection Time    12/13/13  2:38 AM      Result Value Ref Range   WBC 17.9 (*) 4.0 - 10.5 K/uL   RBC 2.61 (*) 4.22 - 5.81 MIL/uL   Hemoglobin 7.3 (*) 13.0 - 17.0 g/dL   HCT 22.1 (*) 39.0 - 52.0 %   MCV 84.7  78.0 - 100.0 fL   MCH 28.0  26.0 - 34.0 pg   MCHC 33.0  30.0 - 36.0 g/dL   RDW 19.1 (*) 11.5 - 15.5 %   Platelets 64 (*) 150 - 400 K/uL   Comment: CONSISTENT WITH PREVIOUS RESULT   Neutrophils Relative % 98 (*) 43 - 77 %   Neutro Abs 17.5 (*) 1.7 - 7.7 K/uL   Lymphocytes Relative 1 (*) 12 - 46 %   Lymphs Abs 0.1 (*) 0.7 - 4.0 K/uL   Monocytes Relative 1 (*) 3 - 12 %   Monocytes Absolute 0.2  0.1 - 1.0 K/uL   Eosinophils Relative 0  0 - 5 %    Eosinophils Absolute 0.0  0.0 - 0.7 K/uL   Basophils Relative 0  0 - 1 %   Basophils Absolute 0.0  0.0 - 0.1 K/uL  GLUCOSE, CAPILLARY     Status: Abnormal   Collection Time    12/13/13  3:22 AM      Result Value Ref Range   Glucose-Capillary 133 (*) 70 - 99 mg/dL  CALCIUM, IONIZED     Status: Abnormal   Collection Time    12/13/13  3:30 AM      Result Value Ref Range   Calcium, Ion 0.95 (*) 1.12 - 1.23 mmol/L   Comment: Performed at [place], ARTERIAL     Status: Abnormal   Collection Time    12/13/13  3:52 AM      Result Value Ref Range   FIO2 0.50     Delivery systems VENTILATOR     Mode PRESSURE REGULATED VOLUME CONTROL     VT 450     Rate 30     Peep/cpap 8.0     pH, Arterial 7.225 (*) 7.350 -  7.450   pCO2 arterial 45.2 (*) 35.0 - 45.0 mmHg   pO2, Arterial 71.0 (*) 80.0 - 100.0 mmHg   Bicarbonate 18.1 (*) 20.0 - 24.0 mEq/L   TCO2 19.4  0 - 100 mmol/L   Acid-base deficit 8.2 (*) 0.0 - 2.0 mmol/L   O2 Saturation 91.7     Patient temperature 98.6     Collection site ARTERIAL LINE     Drawn by [phone]     Sample type ARTERIAL DRAW    GLUCOSE, CAPILLARY     Status: Abnormal   Collection Time    12/13/13  8:22 AM      Result Value Ref Range   Glucose-Capillary 146 (*) 70 - 99 mg/dL  GLUCOSE, CAPILLARY     Status: Abnormal   Collection Time    12/13/13 12:16 PM      Result Value Ref Range   Glucose-Capillary 151 (*) 70 - 99 mg/dL    Dg Chest Port 1 View  12/13/2013   CLINICAL DATA:  58 year old male with septic shock. Intubated. Initial encounter.  EXAM: PORTABLE CHEST - 1 VIEW  COMPARISON:  12/12/2013 and earlier.  FINDINGS: Portable AP semi upright view at 0524 hrs. Endotracheal tube tip in stable position just below the clavicles. Feeding tube courses to the left upper quadrant, tip partially included. Stable left IJ central line. Stable cardiac size and mediastinal contours. Bilateral confluent and veiling pulmonary opacity greatest at the lung  bases. Ventilation is stable. No pneumothorax.  IMPRESSION: 1.  Stable lines and tubes. 2. Stable ventilation with bilateral pleural effusions, lower lobe collapse or consolidation, and superimposed widespread pulmonary opacity.   Electronically Signed   By: Lars Pinks M.D.   On: 12/13/2013 07:45   Dg Chest Port 1 View  12/12/2013   CLINICAL DATA:  Difficulty breathing  EXAM: PORTABLE CHEST - 1 VIEW  COMPARISON:  December 11, 2012  FINDINGS: Endotracheal tube tip is 4.3 cm above the carina. Feeding tube tip extends below the diaphragm. Central catheter tip is in the superior vena cava. No pneumothorax is discernible on this study. Note that there is subcutaneous air on the left, however.  Widespread interstitial and patchy alveolar edema remain without change. Consolidation in the left lower lobe remains as do bilateral effusions. Heart is prominent but stable. The pulmonary vascularity is normal. No adenopathy is appreciable.  IMPRESSION: Widespread interstitial and patchy alveolar disease with effusions. The appearance is consistent with congestive heart failure. There may well be superimposed ARDS. There is subcutaneous air on the left but no demonstrable pneumothorax. Tube and catheter positions are as described.   Electronically Signed   By: Lowella Grip M.D.   On: 12/12/2013 07:23   Dg Abd Portable 1v  12/13/2013   CLINICAL DATA:  Ileus versus bowel obstruction.  EXAM: PORTABLE ABDOMEN - 1 VIEW  COMPARISON:  None.  FINDINGS: Partial visualization of the lungs demonstrates diffuse airspace disease at the bases with cavitation in the RIGHT mid lung.  Feeding tube is present with the tip in the proximal gastric fundus. Bilateral pleural effusions are present. No gross plain film evidence of free air. The bowel gas pattern appears nonobstructive with gas in the transverse colon. No dilated loops of small bowel are identified. Rectal third Mr. noted. Gas is present along the course of the descending colon.  LEFT hip osteoarthritis.  IMPRESSION: 1. Nonobstructive bowel gas pattern. 2. Feeding tube with the tip in the gastric fundus. 3. Diffuse airspace disease in the lungs  with cavitary RIGHT upper lobe lesion. Small bilateral pleural effusions.   Electronically Signed   By: Dereck Ligas M.D.   On: 12/13/2013 09:36    ROS Blood pressure 124/58, pulse 101, temperature 97.4 F (36.3 C), temperature source Oral, resp. rate 32, height 5' 11.65" (1.82 m), weight 94 kg (207 lb 3.7 oz), SpO2 93.00%. Physical Exam Physical Examination: General appearance - paralyzed on Vent Mental status - as above Eyes -  Dysconjugate, pupils react but sluggish Mouth - mucous membranes moist, pharynx normal without lesions Neck - supple, , adenopathy noted PCL Lymphatics - posterior cervical nodes Chest - rhonchi noted bilat Heart - normal rate, regular rhythm, normal S1, S2, no murmurs, rubs, clicks or gallops Abdomen - no bs, firm Neurological - not responsive with paralysis Musculoskeletal - no joint tenderness, deformity or swelling Extremities - pedal edema 3 + Skin - pale  Assessment/Plan: 1 AKI with RTA.  The 2 parallel each other.  Severe vol xs also.  Suspect Drug toxicity with interstitial injury, with Vanco, PPI, Voriconazole being most likely and beta lactams less.  Oliguria worrisome and Vol xs severe, need to see if can make urine. 2 VDRF per CCM 3 Psoriatic arthritis 4. Anemia  severe 5. FTT 6 Immunocomp state 7 Infx  ? If has ongoing infx P UA, urine eos, U Na, Cr, Lasix, hold vanc, consider d/c Voriconazole,   Anabel Lykins L 12/13/2013, 2:45 PM

## 2013-12-13 NOTE — Care Management Note (Unsigned)
    Page 1 of 1   12/13/2013     2:26:15 PM CARE MANAGEMENT NOTE 12/13/2013  Patient:  Lawrence Jennings, Lawrence Jennings   Account Number:  [id number]  Date Initiated:  12/03/2013  Documentation initiated by:  Avie Arenas  Subjective/Objective Assessment:   Readmitted with sepsis - AMS - intubated and on  pressors.     Action/Plan:   Anticipated DC Date:  12/16/2013   Anticipated DC Plan:  SKILLED NURSING FACILITY      DC Planning Services  CM consult      Choice offered to / List presented to:             Status of service:  In process, will continue to follow Medicare Important Message given?   (If response is "NO", the following Medicare IM given date fields will be blank) Date Medicare IM given:   Medicare IM given by:   Date Additional Medicare IM given:   Additional Medicare IM given by:    Discharge Disposition:    Per UR Regulation:  Reviewed for med. necessity/level of care/duration of stay  If discussed at Long Length of Stay Meetings, dates discussed:   12/08/2013  12/13/2013    Comments:  Contact:  Bonfiglio,Sheila Sister [phone]  12/13/13 Sidney Ace, RN, BSN [phone] Pt heading into chronic respiratory failure, per MD notes; may need trach.  Renal function is declining.  Will follow progress.  05-Jan-2014 10:15am  Avie Arenas, RNBSN [phone] Continues on vent - now on ARDS protocol - FI02 70-80%, 10 peep.

## 2013-12-13 NOTE — Progress Notes (Signed)
Patient ID: Lawrence Jennings, male   DOB: June 29, 1955, 58 y.o.   MRN: [id number]         [facility] for Infectious Disease    Date of Admission:  11/03/2013   Total days of antibiotics 12        Day 3 cefepime        Day 3 vancomycin        Day 3 metronidazole        Day 3 voriconazole Active Problems:   Septic shock   . antiseptic oral rinse  7 mL Mouth Rinse QID  . artificial tears  1 application Both Eyes 3 times per day  . ceFEPime (MAXIPIME) IV  1 g Intravenous Q24H  . chlorhexidine  15 mL Mouth Rinse BID  . [START ON 12/14/2013] famotidine (PEPCID) IV  20 mg Intravenous Q24H  . feeding supplement (PRO-STAT SUGAR FREE 64)  30 mL Per Tube TID  . hydrocortisone sod succinate (SOLU-CORTEF) inj  50 mg Intravenous Q12H  . insulin aspart  2-6 Units Subcutaneous 6 times per day  . ipratropium-albuterol  3 mL Nebulization Q6H  . metoCLOPramide (REGLAN) injection  5 mg Intravenous 3 times per day  . metronidazole  500 mg Intravenous Q8H  . voriconazole  300 mg Per Tube Q12H    Objective: Temp:  [97.3 F (36.3 C)-98.2 F (36.8 C)] 97.4 F (36.3 C) (08/11 1225) Pulse Rate:  [101-118] 101 (08/11 1400) Resp:  [30-34] 32 (08/11 1207) BP: (92-166)/(51-74) 124/58 mmHg (08/11 1207) SpO2:  [88 %-100 %] 93 % (08/11 1408) Arterial Line BP: (102-148)/(49-64) 125/58 mmHg (08/11 1200) FiO2 (%):  [50 %-60 %] 60 % (08/11 1408) Weight:  [207 lb 3.7 oz (94 kg)] 207 lb 3.7 oz (94 kg) (08/11 0500)  General: He remains sedated and paralyzed on the ventilator Skin: Dry skin Eyes: Bilateral lid edema Lungs: Few rhonchi Cor: Regular S1 and S2 no murmurs  Lab Results Lab Results  Component Value Date   WBC 17.9* 12/13/2013   HGB 7.3* 12/13/2013   HCT 22.1* 12/13/2013   MCV 84.7 12/13/2013   PLT 64* 12/13/2013    Lab Results  Component Value Date   CREATININE 2.81* 12/13/2013   BUN 57* 12/13/2013   NA 137 12/13/2013   K 3.7 12/13/2013   CL 104 12/13/2013   CO2 18* 12/13/2013    Lab  Results  Component Value Date   ALT 12 12/08/2013   AST 92* 12/08/2013   ALKPHOS 154* 12/08/2013   BILITOT 0.5 12/08/2013      Microbiology: Recent Results (from the past 240 hour(s))  CULTURE, RESPIRATORY (NON-EXPECTORATED)     Status: None   Collection Time    12/07/13 12:31 PM      Result Value Ref Range Status   Specimen Description TRACHEAL ASPIRATE   Final   Special Requests Immunocompromised   Final   Gram Stain     Final   Value: FEW WBC PRESENT,BOTH PMN AND MONONUCLEAR     RARE SQUAMOUS EPITHELIAL CELLS PRESENT     NO ORGANISMS SEEN     Performed at [facility]   Culture     Final   Value: FEW KLEBSIELLA PNEUMONIAE     Performed at [facility]   Report Status 12/24/2013 FINAL   Final   Organism ID, Bacteria KLEBSIELLA PNEUMONIAE   Final  PNEUMOCYSTIS JIROVECI SMEAR BY DFA     Status: None   Collection Time    12/07/13 12:38 PM  Result Value Ref Range Status   Specimen Source-PJSRC ENDOTRACHEAL   Final   Pneumocystis jiroveci Ag NEGATIVE   Final   Comment: Performed at [facility]  CULTURE, BAL-QUANTITATIVE     Status: None   Collection Time    01/08/2014 12:17 PM      Result Value Ref Range Status   Specimen Description BRONCHIAL ALVEOLAR LAVAGE   Final   Special Requests Immunocompromised   Final   Gram Stain     Final   Value: FEW WBC PRESENT,BOTH PMN AND MONONUCLEAR     NO SQUAMOUS EPITHELIAL CELLS SEEN     NO ORGANISMS SEEN     Performed at [facility] Count     Final   Value: 10,000 COLONIES/ML     Performed at [facility]   Culture     Final   Value: Non-Pathogenic Oropharyngeal-type Flora Isolated.     Performed at [facility]   Report Status 12/11/2013 FINAL   Final  FUNGUS CULTURE W SMEAR     Status: None   Collection Time    01-08-2014 12:21 PM      Result Value Ref Range Status   Specimen Description BRONCHIAL ALVEOLAR LAVAGE   Final   Special Requests Immunocompromised   Final    Fungal Smear     Final   Value: NO YEAST OR FUNGAL ELEMENTS SEEN     Performed at [facility]   Culture     Final   Value: CANDIDA ALBICANS     Performed at [facility]   Report Status PENDING   Incomplete    Studies/Results: Dg Chest Port 1 View  12/13/2013   CLINICAL DATA:  58 year old male with septic shock. Intubated. Initial encounter.  EXAM: PORTABLE CHEST - 1 VIEW  COMPARISON:  12/12/2013 and earlier.  FINDINGS: Portable AP semi upright view at 0524 hrs. Endotracheal tube tip in stable position just below the clavicles. Feeding tube courses to the left upper quadrant, tip partially included. Stable left IJ central line. Stable cardiac size and mediastinal contours. Bilateral confluent and veiling pulmonary opacity greatest at the lung bases. Ventilation is stable. No pneumothorax.  IMPRESSION: 1.  Stable lines and tubes. 2. Stable ventilation with bilateral pleural effusions, lower lobe collapse or consolidation, and superimposed widespread pulmonary opacity.   Electronically Signed   By: Augusto Gamble M.D.   On: 12/13/2013 07:45   Dg Chest Port 1 View  12/12/2013   CLINICAL DATA:  Difficulty breathing  EXAM: PORTABLE CHEST - 1 VIEW  COMPARISON:  December 11, 2012  FINDINGS: Endotracheal tube tip is 4.3 cm above the carina. Feeding tube tip extends below the diaphragm. Central catheter tip is in the superior vena cava. No pneumothorax is discernible on this study. Note that there is subcutaneous air on the left, however.  Widespread interstitial and patchy alveolar edema remain without change. Consolidation in the left lower lobe remains as do bilateral effusions. Heart is prominent but stable. The pulmonary vascularity is normal. No adenopathy is appreciable.  IMPRESSION: Widespread interstitial and patchy alveolar disease with effusions. The appearance is consistent with congestive heart failure. There may well be superimposed ARDS. There is subcutaneous air on the left but no  demonstrable pneumothorax. Tube and catheter positions are as described.   Electronically Signed   By: Bretta Bang M.D.   On: 12/12/2013 07:23   Dg Abd Portable 1v  12/13/2013   CLINICAL  DATA:  Ileus versus bowel obstruction.  EXAM: PORTABLE ABDOMEN - 1 VIEW  COMPARISON:  None.  FINDINGS: Partial visualization of the lungs demonstrates diffuse airspace disease at the bases with cavitation in the RIGHT mid lung.  Feeding tube is present with the tip in the proximal gastric fundus. Bilateral pleural effusions are present. No gross plain film evidence of free air. The bowel gas pattern appears nonobstructive with gas in the transverse colon. No dilated loops of small bowel are identified. Rectal third Mr. noted. Gas is present along the course of the descending colon. LEFT hip osteoarthritis.  IMPRESSION: 1. Nonobstructive bowel gas pattern. 2. Feeding tube with the tip in the gastric fundus. 3. Diffuse airspace disease in the lungs with cavitary RIGHT upper lobe lesion. Small bilateral pleural effusions.   Electronically Signed   By: Andreas Newport M.D.   On: 12/13/2013 09:36    Assessment: He has not improved despite having been treated for the Klebsiella initially isolated from his sputum cultures. His more recent BAL cultures have grown Candida albicans which is likely to be an asymptomatic colonizer.  Plan: 1. Continue current broad antimicrobial therapy  Cliffton Asters, MD [facility] for Infectious Disease [facility] [phone] pager   [phone] cell 12/13/2013, 2:29 PM

## 2013-12-13 NOTE — Progress Notes (Addendum)
Patient CW:[id number] Lawrence Jennings      DOB: [birth year]      BTD:[id number]   Addendum: Time confirmed with call back from Lawrence Jennings, Thursday 8/13 at 445 pm.    Consult for PMT goals of care received.  Spoke with two of the three sisters who need to be present.  Two of the sisters work out of town . The earliest that they can meet is Thursday afternoon.  I have reserved the 4-600 pm slot . They will call to confirm the exact time.  Updated Dr. Danielle Rankin L. Ladona Ridgel, MD MBA The Palliative Medicine Team at [facility] Phone: [phone] Pager: [phone] ( Use team phone after hours)

## 2013-12-13 NOTE — Progress Notes (Signed)
eLink Physician-Brief Progress Note Patient Name: Lawrence Jennings No DOB: 04/14/1956 MRN: [id number]  Date of Service  12/13/2013   HPI/Events of Note  TFs initiated yesterday.  Now with 2 residuals of greater than 500 cc.   eICU Interventions  Plan: Start reglan 10 mg IV q8 hours Recheck residual in 4 hours      DETERDING,ELIZABETH 12/13/2013, 2:26 AM

## 2013-12-13 NOTE — Progress Notes (Addendum)
PULMONARY / CRITICAL CARE MEDICINE   Name: Lawrence Jennings MRN: [id number] DOB: Feb 16, 1956    ADMISSION DATE:  11/23/2013 CONSULTATION DATE:  12/03/13   REFERRING MD :  ED  CHIEF COMPLAINT:  AMS  INITIAL PRESENTATION: 58 y/o M with PMH of psoriatic arthritis on Humira + Pred, recent admission (7/3-7/7) for FTT & cellulitis (doxy) who presented to [facility] ER on 7/31 after being found by family with AMS & weakness.  Intubated for hypoxic respiratory failure / ALI.    PMH - mild thrombocytopenia of unknown etiology,diabetes that has been poorly controlled.   STUDIES:  8/2 - Echo EF 60% 8/04 - CT Chest with Pneumomediastinum and RLL cavitary lesion  SIGNIFICANT EVENTS: 8/01 - admitted to ICU for hypoxic respiratory failure 8/03 - levo gtt weaned off 8/06 - breath stacking, changed MV to pressure control, changed sedation, large/dark residuals from OGT 8/07 - small LUL PTX, worsening hypotension and CXR, ARDS protocol started, and paralytics, upper EGD with small gastric ulcers.  8/9 stopped nmb to prevent neuropathy and evaluate further need.  Sedated and  NMB stopped at 0830 12/12/13: On 50% fio2, peep 8; dysnchronous with ventilator. BP running soft. Heading into oligruia    SUBJECTIVE/OVERNIGHT/INTERVAL HX 12/13/13: Back on nimbex since yesterday. due to dysnchrony and fio2 improved to 50%, peep 5.  . BP responded to fluids but still oliguric; creat stabilizing. High TF residuals despite reglan and TF stopped  VITAL SIGNS: Temp:  [97.3 F (36.3 C)-98.2 F (36.8 C)] 97.3 F (36.3 C) (08/11 0828) Pulse Rate:  [106-118] 110 (08/11 0806) Resp:  [30-34] 30 (08/11 0806) BP: (91-166)/(46-74) 166/69 mmHg (08/11 0806) SpO2:  [90 %-100 %] 100 % (08/11 0806) Arterial Line BP: (92-148)/(45-64) 148/64 mmHg (08/11 0800) FiO2 (%):  [50 %] 50 % (08/11 0806) Weight:  [94 kg (207 lb 3.7 oz)] 94 kg (207 lb 3.7 oz) (08/11 0500)  VENTILATOR SETTINGS: Vent Mode:  [-] PRVC FiO2 (%):  [50 %] 50 % Set  Rate:  [30 bmp] 30 bmp Vt Set:  [450 mL] 450 mL PEEP:  [5 cmH20-8 cmH20] 5 cmH20 Plateau Pressure:  [13 cmH20-22 cmH20] 19 cmH20  INTAKE / OUTPUT:  Intake/Output Summary (Last 24 hours) at 12/13/13 7425 Last data filed at 12/13/13 0800  Gross per 24 hour  Intake 5731.96 ml  Output    176 ml  Net 5555.96 ml    PHYSICAL EXAMINATION: General: looks critically ill Neuro:  Deeply sedated and paralyzed. BIS 50 HEENT:  Proptosis, PERRL, EOMI, OP clear, dry mucosa, subcutaneous air (crepitus) no change 8/9,multiple missing teeth Cardiovascular:  Tachycardic, regular rhythm, no MRG Lungs: Decreased bs bases, + rhonchi bilateral ,  Diminished BS, no HSM.  Musculoskeletal:  +pedal edema Skin:   hyperpigmented areas on face and chest   LABS:  PULMONARY  Recent Labs Lab 12/11/13 0342 12/12/13 0807 12/12/13 1502 12/12/13 2205 12/13/13 0352  PHART 7.281* 7.323* 7.293* 7.215* 7.225*  PCO2ART 48.4* 38.7 40.1 47.7* 45.2*  PO2ART 54.9* 58.0* 50.0* 69.5* 71.0*  HCO3 22.3 20.2 19.6* 18.6* 18.1*  TCO2 23.8 21 21  20.1 19.4  O2SAT 87.6 89.0 82.0 91.2 91.7    CBC  Recent Labs Lab 12/11/13 0107 12/12/13 0400 12/13/13 0238  HGB 7.0* 7.1* 7.3*  HCT 22.9* 22.0* 22.1*  WBC 9.6 13.7* 17.9*  PLT 50* 51* 64*    COAGULATION  Recent Labs Lab 12/12/13 0400  INR 1.47    CARDIAC  No results found for this basename: TROPONINI,  in the last  168 hours No results found for this basename: PROBNP,  in the last 168 hours   CHEMISTRY  Recent Labs Lab 12/07/13 1515  12/11/13 0107 12/12/13 0400 12/12/13 1504 12/12/13 2157 12/13/13 0238  NA 161*  < > 144 143 141 139 137  K 3.2*  < > 3.8 3.9 4.0 3.7 3.7  CL 126*  < > 109 107 107 105 104  CO2 25  < > 23 20 19  18* 18*  GLUCOSE 181*  < > 170* 115* 150* 149* 142*  BUN 24*  < > 37* 50* 55* 56* 57*  CREATININE 0.94  < > 1.97* 2.61* 2.80* 2.77* 2.81*  CALCIUM 6.9*  < > 6.4* 6.6* 6.4* 6.2* 6.2*  MG 1.9  --   --  1.5  --   --  1.8  PHOS  2.3  --   --  5.3*  --   --  5.4*  < > = values in this interval not displayed. Estimated Creatinine Clearance: 34.8 ml/min (by C-G formula based on Cr of 2.81).   LIVER  Recent Labs Lab 12/08/13 0545 12/12/13 0400  AST 92*  --   ALT 12  --   ALKPHOS 154*  --   BILITOT 0.5  --   PROT 5.0*  --   ALBUMIN 1.1*  --   INR  --  1.47     INFECTIOUS  Recent Labs Lab 01-04-2014 0425 01/04/14 1300 12/10/13 1205  LATICACIDVEN 2.3* 2.3* 2.0     ENDOCRINE CBG (last 3)   Recent Labs  12/13/13 0007 12/13/13 0322 12/13/13 0822  GLUCAP 129* 133* 146*         IMAGING x48h US Abdomen Port  12/11/2013   CLINICAL DATA:  58 year old male with renal failure, sepsis and thrombocytopenia  EXAM: ULTRASOUND PORTABLE ABDOMEN  COMPARISON:  12/07/2013 CT  FINDINGS: Gallbladder:  Gallbladder sludge identified. There is no evidence of cholelithiasis or acute cholecystitis.  Common bile duct:  Diameter: 4.4 mm. There is no evidence of intrahepatic or extrahepatic biliary dilatation.  Liver:  No focal lesion identified. Within normal limits in parenchymal echogenicity.  IVC:  No abnormality visualized.  Pancreas:  Visualized portion unremarkable.  Spleen:  Size and appearance within normal limits.  Right Kidney:  Length: 12.9 cm. Echogenicity is upper limits of normal. There is no evidence of solid mass or hydronephrosis.  Left Kidney:  Length: 13 cm. Echogenicity is upper limits of normal. There is no evidence of solid mass or hydronephrosis.  Abdominal aorta:  No aneurysm visualized.  Other findings:  Small bilateral pleural effusions and small amount and ascites noted.  IMPRESSION: Gallbladder sludge without cholelithiasis or evidence of acute cholecystitis.  Small bilateral pleural effusions of small amount of ascites.  Upper limits of normal renal echogenicity which can be seen with medical renal disease.   Electronically Signed   By: Laveda Abbe M.D.   On: 12/11/2013 12:41   Dg Chest Port 1  View  12/13/2013   CLINICAL DATA:  58 year old male with septic shock. Intubated. Initial encounter.  EXAM: PORTABLE CHEST - 1 VIEW  COMPARISON:  12/12/2013 and earlier.  FINDINGS: Portable AP semi upright view at 0524 hrs. Endotracheal tube tip in stable position just below the clavicles. Feeding tube courses to the left upper quadrant, tip partially included. Stable left IJ central line. Stable cardiac size and mediastinal contours. Bilateral confluent and veiling pulmonary opacity greatest at the lung bases. Ventilation is stable. No pneumothorax.  IMPRESSION: 1.  Stable  lines and tubes. 2. Stable ventilation with bilateral pleural effusions, lower lobe collapse or consolidation, and superimposed widespread pulmonary opacity.   Electronically Signed   By: Augusto Gamble M.D.   On: 12/13/2013 07:45   Dg Chest Port 1 View  12/12/2013   CLINICAL DATA:  Difficulty breathing  EXAM: PORTABLE CHEST - 1 VIEW  COMPARISON:  December 11, 2012  FINDINGS: Endotracheal tube tip is 4.3 cm above the carina. Feeding tube tip extends below the diaphragm. Central catheter tip is in the superior vena cava. No pneumothorax is discernible on this study. Note that there is subcutaneous air on the left, however.  Widespread interstitial and patchy alveolar edema remain without change. Consolidation in the left lower lobe remains as do bilateral effusions. Heart is prominent but stable. The pulmonary vascularity is normal. No adenopathy is appreciable.  IMPRESSION: Widespread interstitial and patchy alveolar disease with effusions. The appearance is consistent with congestive heart failure. There may well be superimposed ARDS. There is subcutaneous air on the left but no demonstrable pneumothorax. Tube and catheter positions are as described.   Electronically Signed   By: Bretta Bang M.D.   On: 12/12/2013 07:23       ASSESSMENT / PLAN:  PULMONARY ETT 2013-12-05 >> A:  Acute Hypoxic Resp Failure/ARDS Sepsis\Cavitation on CT  with pneumomediastium, concern for Fungal infection Small LUL PTX   - On 8/11/5: on 72h of nimbex,  ARDS persists but improved 50%fio2/peep 5.     P:   ARDS protocol to continue Wean nimbex off and reassess PTX is small, but with hypoxia will have to increase PEEP and may place chest tube - for now will monitor with serial cxr and hemodynamics Will need to consider tracheostomy  CARDIOVASCULAR CVL:8/1 l i j cvl>> A:  Septic shock - levophed gtt weaned off 8/3 am , currently off pressors Hypotension - on versed and fentanyl   - off pressors 12/12/13 and bp soft with low CVP and responded to fluid bolus; Normal BP 12/13/13  P:  Reduce fluid intake 12/13/13 and change to d5 half normal saline at 50cc/h Wean stress steroids to  Low dose hydrocort 50mg  Q24h till we sort out if he was on chronic prednisone RENAL A:   AKI - Likely secondary to volume depletion and hypotension, worsening.Normal renal 12/11/13   - worsening renal function 12/12/13 and stabilization 12/13/13 but oliguric and having dropping bicarb. DDx Drugs v Sepsis  P:   Call renal consult Trend BMP Monitor UOP   GASTROINTESTINAL A:   Vent associated Dysphagia  ? Aspiration component Increased Gastric Residual / ABD Distention Hypertriglyceridemia    - Ongoing high TF residuals recurrence 12/13/13 despite reglan during rehchallenge 12/12/13  (high TF around 12/05/13, Sp UGI08/21/2015  scope with multiple gastric ulcers related to OG trauma with recommendation to use doboff)    P:   Check AXR Consider diabetic gastroparesis as cause Hold off Tube feeds  DO NOT PLACE OGT TO SUCTION.   Use doboff PPI   HEMATOLOGIC A:   Thrombocytopenia - Mild - unknown etiology.  Noted since July 2015 Anemia Hgb 7.0 8/9   - improving platelet count P:  Trend CBC, monitor platelets, avoid toxic drugs.  Transfuse for hgb <7.0 SCDs   INFECTIOUS A:   HCAP Sputum 12/03/13 with Group A beta hemolytic strep, 12/07/13 with Klebsiella-  in immunocompromised patient (Humira and prednisone)  - s/p bronchoscopy with BAL 12/19/2013: cutlure negative as of 12/13/13    P:  Rx HCAP now with Cavitary lesion, high concern for fungal infection  BCx2 7/31 >> ng UC 8/1 >> ng Sputum 8/5 >> K. Pneumonia BAL 12/23/2013 >>  Abx: ID directing zosyn, start date 8/1>>8/7,  Vanc, start date 8/5 >> Micafungin, start date 8/1>>8/7, Flagyl 8/7>> Cefepime 8/7>> Vfend 8/8>>   ENDOCRINE A:   DM II Chronic Steroid Administration   Proptosis - TSH nml  P:   SSI ean stress steroids to  Low dose hydrocort 50mg  Q24h till we sort out if he was on chronic prednisone for sure because prednisone is not on home MAR Lacrilube for eye protection  NEUROLOGIC A:   AMS, No diprivan due to high TGL P:   RASS goal: -3 Daily WUA Versed gtt Fentanyl gtt PRN versed    GLOBAL 12/12/13: No family at bedside. Full code 12/13/13: Per RN: a sister is listed as contact but since admission 11/17/2013 no family visits   TODAY'S SUMMARY: 58 y/o with FTT, psoriatic arthritis admitted with HCAP, AKI, resolved septic shock, RLL\RML cavitation with pneumomediastium on CT chest and LUL PTX on CXR, correcting Hyponatremia\dehydration, treating HCAP . On ARDS protocol . NMB stopped 8/9 and evaluate need. . Renal function continues to slowly decline. Abx per ID. Cytology from FOB pending. Main issues a) renal insuff - call renal ; b) high TF residual - check AXR; c) acute heading into chronic resp failure - needs trach; wil try to consent; d) low platelets - improving   The patient is critically ill with multiple organ systems failure and requires high complexity decision making for assessment and support, frequent evaluation and titration of therapies, application of advanced monitoring technologies and extensive interpretation of multiple databases.   Critical Care Time devoted to patient care services described in this note is  35  Minutes.    Dr. 10/9, M.D., [facility].C.P Pulmonary and Critical Care Medicine Staff Physician [facility] [place] Pulmonary and Critical Care Pager: [phone], If no answer or between  15:00h - 7:00h: call [phone]  12/13/2013 9:07 AM

## 2013-12-14 DIAGNOSIS — N179 Acute kidney failure, unspecified: Secondary | ICD-10-CM

## 2013-12-14 DIAGNOSIS — E872 Acidosis, unspecified: Secondary | ICD-10-CM

## 2013-12-14 DIAGNOSIS — N289 Disorder of kidney and ureter, unspecified: Secondary | ICD-10-CM

## 2013-12-14 LAB — CBC WITH DIFFERENTIAL/PLATELET
BASOS ABS: 0 10*3/uL (ref 0.0–0.1)
BASOS PCT: 0 % (ref 0–1)
EOS PCT: 0 % (ref 0–5)
Eosinophils Absolute: 0 10*3/uL (ref 0.0–0.7)
HEMATOCRIT: 22.3 % — AB (ref 39.0–52.0)
Hemoglobin: 7.2 g/dL — ABNORMAL LOW (ref 13.0–17.0)
Lymphocytes Relative: 1 % — ABNORMAL LOW (ref 12–46)
Lymphs Abs: 0.1 10*3/uL — ABNORMAL LOW (ref 0.7–4.0)
MCH: 27.1 pg (ref 26.0–34.0)
MCHC: 32.3 g/dL (ref 30.0–36.0)
MCV: 83.8 fL (ref 78.0–100.0)
Monocytes Absolute: 0.3 10*3/uL (ref 0.1–1.0)
Monocytes Relative: 2 % — ABNORMAL LOW (ref 3–12)
Neutro Abs: 16.9 10*3/uL — ABNORMAL HIGH (ref 1.7–7.7)
Neutrophils Relative %: 98 % — ABNORMAL HIGH (ref 43–77)
Platelets: 72 10*3/uL — ABNORMAL LOW (ref 150–400)
RBC: 2.66 MIL/uL — ABNORMAL LOW (ref 4.22–5.81)
RDW: 18.9 % — AB (ref 11.5–15.5)
WBC: 17.2 10*3/uL — ABNORMAL HIGH (ref 4.0–10.5)

## 2013-12-14 LAB — GLUCOSE, CAPILLARY
GLUCOSE-CAPILLARY: 103 mg/dL — AB (ref 70–99)
GLUCOSE-CAPILLARY: 106 mg/dL — AB (ref 70–99)
GLUCOSE-CAPILLARY: 99 mg/dL (ref 70–99)
Glucose-Capillary: 99 mg/dL (ref 70–99)
Glucose-Capillary: 91 mg/dL (ref 70–99)
Glucose-Capillary: 104 mg/dL — ABNORMAL HIGH (ref 70–99)

## 2013-12-14 LAB — POCT I-STAT 3, ART BLOOD GAS (G3+)
Acid-base deficit: 9 mmol/L — ABNORMAL HIGH (ref 0.0–2.0)
Bicarbonate: 17.8 mEq/L — ABNORMAL LOW (ref 20.0–24.0)
O2 SAT: 86 %
PCO2 ART: 40.6 mmHg (ref 35.0–45.0)
TCO2: 19 mmol/L (ref 0–100)
pH, Arterial: 7.25 — ABNORMAL LOW (ref 7.350–7.450)
pO2, Arterial: 59 mmHg — ABNORMAL LOW (ref 80.0–100.0)

## 2013-12-14 LAB — BASIC METABOLIC PANEL
Anion gap: 19 — ABNORMAL HIGH (ref 5–15)
BUN: 69 mg/dL — AB (ref 6–23)
CALCIUM: 6.4 mg/dL — AB (ref 8.4–10.5)
CO2: 16 mEq/L — ABNORMAL LOW (ref 19–32)
CREATININE: 3.29 mg/dL — AB (ref 0.50–1.35)
Chloride: 105 mEq/L (ref 96–112)
GFR, EST AFRICAN AMERICAN: 23 mL/min — AB (ref >90)
GFR, EST NON AFRICAN AMERICAN: 19 mL/min — AB (ref >90)
Glucose, Bld: 104 mg/dL — ABNORMAL HIGH (ref 70–99)
POTASSIUM: 3.8 meq/L (ref 3.7–5.3)
Sodium: 140 mEq/L (ref 137–147)

## 2013-12-14 LAB — HEPATITIS C ANTIBODY (REFLEX): HCV Ab: NEGATIVE

## 2013-12-14 LAB — PRO B NATRIURETIC PEPTIDE: Pro B Natriuretic peptide (BNP): 2132 pg/mL — ABNORMAL HIGH (ref 0–125)

## 2013-12-14 LAB — PARATHYROID HORMONE, INTACT (NO CA): PTH: 328.1 pg/mL — ABNORMAL HIGH (ref 14.0–72.0)

## 2013-12-14 LAB — PHOSPHORUS: Phosphorus: 5.9 mg/dL — ABNORMAL HIGH (ref 2.3–4.6)

## 2013-12-14 LAB — C3 COMPLEMENT: C3 Complement: 58 mg/dL — ABNORMAL LOW (ref 90–180)

## 2013-12-14 LAB — C4 COMPLEMENT: COMPLEMENT C4, BODY FLUID: 14 mg/dL — AB (ref 10–40)

## 2013-12-14 LAB — MAGNESIUM: Magnesium: 1.8 mg/dL (ref 1.5–2.5)

## 2013-12-14 MED ORDER — SENNOSIDES 8.8 MG/5ML PO SYRP
5.0000 mL | ORAL_SOLUTION | Freq: Two times a day (BID) | ORAL | Status: DC
  Administered 2013-12-14 – 2013-12-15 (×3): 5 mL
  Filled 2013-12-14 (×8): qty 5

## 2013-12-14 MED ORDER — METOCLOPRAMIDE HCL 5 MG/ML IJ SOLN
5.0000 mg | Freq: Four times a day (QID) | INTRAMUSCULAR | Status: DC
  Administered 2013-12-14 – 2013-12-15 (×6): 5 mg via INTRAVENOUS
  Filled 2013-12-14 (×8): qty 1

## 2013-12-14 MED ORDER — VORICONAZOLE 200 MG PO TABS
200.0000 mg | ORAL_TABLET | Freq: Two times a day (BID) | ORAL | Status: DC
  Administered 2013-12-14 – 2013-12-16 (×4): 200 mg
  Filled 2013-12-14 (×5): qty 1

## 2013-12-14 MED ORDER — SODIUM CHLORIDE 0.9 % IV SOLN
1.0000 g | Freq: Once | INTRAVENOUS | Status: AC
  Administered 2013-12-14: 1 g via INTRAVENOUS
  Filled 2013-12-14: qty 10

## 2013-12-14 NOTE — Progress Notes (Signed)
ANTIBIOTIC CONSULT NOTE - f/u  Pharmacy Consult for Cefepime + Voriconazole Indication: cavitary PNA/abd/shock  No Known Allergies  Patient Measurements: Height: 5' 11.65" (182 cm) Weight: 209 lb 14.1 oz (95.2 kg) IBW/kg (Calculated) : 76.8  Vital Signs: Temp: 97.4 F (36.3 C) (08/12 0852) Temp src: Oral (08/12 0852) BP: 109/59 mmHg (08/12 1148) Pulse Rate: 130 (08/12 1148) Intake/Output from previous day: 08/11 0701 - 08/12 0700 In: 2102.2 [I.V.:1444.2; IV Piggyback:658] Out: 95 [Urine:95] Intake/Output from this shift: Total I/O In: 238 [I.V.:38; IV Piggyback:200] Out: 20 [Urine:20]  Labs:  Recent Labs  12/12/13 0400  12/12/13 2157 12/13/13 0238 12/13/13 1623 12/14/13 0450  WBC 13.7*  --   --  17.9*  --  17.2*  HGB 7.1*  --   --  7.3*  --  7.2*  PLT 51*  --   --  64*  --  72*  LABCREA  --   --   --   --  19.35  19.10  --   CREATININE 2.61*  < > 2.77* 2.81*  --  3.29*  < > = values in this interval not displayed. Estimated Creatinine Clearance: 29.9 ml/min (by C-G formula based on Cr of 3.29).  Recent Labs  12/11/13 1200 12/13/13 0238  VANCOTROUGH 50.7*  --   VANCORANDOM  --  43.0     Microbiology: Recent Results (from the past 720 hour(s))  CULTURE, BLOOD (ROUTINE X 2)     Status: None   Collection Time    12/28/2013  9:39 PM      Result Value Ref Range Status   Specimen Description BLOOD LEFT ARM   Final   Special Requests BOTTLES DRAWN AEROBIC ONLY 2CC   Final   Culture  Setup Time     Final   Value: 12/03/2013 03:43     Performed at [facility]   Culture     Final   Value: NO GROWTH 5 DAYS     Performed at [facility]   Report Status 12/30/2013 FINAL   Final  CULTURE, BLOOD (ROUTINE X 2)     Status: None   Collection Time    12-28-13  9:57 PM      Result Value Ref Range Status   Specimen Description BLOOD RIGHT FEMORAL ARTERY   Final   Special Requests BOTTLES DRAWN AEROBIC AND ANAEROBIC [facility] EACH   Final   Culture  Setup  Time     Final   Value: 12/03/2013 03:43     Performed at [facility]   Culture     Final   Value: NO GROWTH 5 DAYS     Performed at [facility]   Report Status 12/11/2013 FINAL   Final  URINE CULTURE     Status: None   Collection Time    12/03/13  2:07 AM      Result Value Ref Range Status   Specimen Description URINE, RANDOM   Final   Special Requests NONE   Final   Culture  Setup Time     Final   Value: 12/03/2013 12:35     Performed at [facility] Count     Final   Value: NO GROWTH     Performed at [facility]   Culture     Final   Value: NO GROWTH     Performed at [facility]   Report Status 12/04/2013 FINAL   Final  RAPID STREP SCREEN  Status: None   Collection Time    12/03/13  2:08 AM      Result Value Ref Range Status   Streptococcus, Group A Screen (Direct) NEGATIVE  NEGATIVE Final   Comment: (NOTE)     A Rapid Antigen test may result negative if the antigen level in the     sample is below the detection level of this test. The FDA has not     cleared this test as a stand-alone test therefore the rapid antigen     negative result has reflexed to a Group A Strep culture.  CULTURE, GROUP A STREP     Status: None   Collection Time    12/03/13  2:08 AM      Result Value Ref Range Status   Specimen Description THROAT   Final   Special Requests NONE   Final   Culture     Final   Value: STREPTOCOCCUS,BETA HEMOLYTIC NOT GROUP A     Performed at [facility]   Report Status 12/05/2013 FINAL   Final  MRSA PCR SCREENING     Status: None   Collection Time    12/03/13  2:38 AM      Result Value Ref Range Status   MRSA by PCR NEGATIVE  NEGATIVE Final   Comment:            The GeneXpert MRSA Assay (FDA     approved for NASAL specimens     only), is one component of a     comprehensive MRSA colonization     surveillance program. It is not     intended to diagnose MRSA     infection nor to guide or      monitor treatment for     MRSA infections.  CULTURE, RESPIRATORY (NON-EXPECTORATED)     Status: None   Collection Time    12/07/13 12:31 PM      Result Value Ref Range Status   Specimen Description TRACHEAL ASPIRATE   Final   Special Requests Immunocompromised   Final   Gram Stain     Final   Value: FEW WBC PRESENT,BOTH PMN AND MONONUCLEAR     RARE SQUAMOUS EPITHELIAL CELLS PRESENT     NO ORGANISMS SEEN     Performed at [facility]   Culture     Final   Value: FEW KLEBSIELLA PNEUMONIAE     Performed at [facility]   Report Status 12/13/2013 FINAL   Final   Organism ID, Bacteria KLEBSIELLA PNEUMONIAE   Final  PNEUMOCYSTIS JIROVECI SMEAR BY DFA     Status: None   Collection Time    12/07/13 12:38 PM      Result Value Ref Range Status   Specimen Source-PJSRC ENDOTRACHEAL   Final   Pneumocystis jiroveci Ag NEGATIVE   Final   Comment: Performed at [facility]  CULTURE, BAL-QUANTITATIVE     Status: None   Collection Time    12/24/2013 12:17 PM      Result Value Ref Range Status   Specimen Description BRONCHIAL ALVEOLAR LAVAGE   Final   Special Requests Immunocompromised   Final   Gram Stain     Final   Value: FEW WBC PRESENT,BOTH PMN AND MONONUCLEAR     NO SQUAMOUS EPITHELIAL CELLS SEEN     NO ORGANISMS SEEN     Performed at [facility] Count     Final  Value: 10,000 COLONIES/ML     Performed at [facility]   Culture     Final   Value: Non-Pathogenic Oropharyngeal-type Flora Isolated.     Performed at [facility]   Report Status 12/11/2013 FINAL   Final  FUNGUS CULTURE W SMEAR     Status: None   Collection Time    12/04/2013 12:21 PM      Result Value Ref Range Status   Specimen Description BRONCHIAL ALVEOLAR LAVAGE   Final   Special Requests Immunocompromised   Final   Fungal Smear     Final   Value: NO YEAST OR FUNGAL ELEMENTS SEEN     Performed at [facility]   Culture     Final   Value:  CANDIDA ALBICANS     Performed at [facility]   Report Status PENDING   Incomplete    Medical History: Past Medical History  Diagnosis Date  . Allergic reaction   . Sinus disease   . Back pain   . Arthritis   . Edema   . Hypertension   . Diabetes mellitus without complication     insulin/metformin    Assessment: 57 YOM admitted on 8/1 continuing on cefepime/flagyl/voriconazole per ID for PNA/abd/shock. Given the patient's history of chronic steroids & humira, fungal coverage continues. A chest CT on 8/5 revelaed penumomediastinum and likely cavitary PNA (4 cm lesion) with ground glass opacities. PCP was negative. Respiratory Cx from 8/5 grew fairly sensitive K PNA, BAL was done on 8/7-normal flora. Afeb, WBC stable 17.2.  The patient's renal function continues to worsen. SCr trending up 3.29 << 2.81, CrCl~30 ml/min, UOP very minimal. Vancomycin d/c'd 8/12.  Vanc 7/31 >> 8/2; 8/5>> 8/12 Cefepime 8/8 >>  Voriconazole 8/8 >> Flagyl 8/8 >> Zosyn 8/1 >> 8/8 Mica 8/1 >> 8/8 Bactrim 8/5 >> 8/6  7/31 BCx >ngf 8/1 UCx >ngf 8/1 rapid strep - neg 8/1 RCx >>B hemolytic strep, not grp A 8/5 RCx >> Kleb pneumo (R-amp, S-all others) 8/5 PCP>>neg 8/7 BAL>> NOF 8/7 fungal>>candida albicans  Goal of Therapy:  Vancomycin trough of 15-20 mcg/ml Proper antibiotics for infection/cultures adjusted for renal/hepatic function  Plan:  -Continue Cefepime 1g IV q24h -Continue Flagyl 500mg  IV q8h -D/c vanc -Reduce voriconazole dose to 200mg  po q12h due to renal function -f/u hepatic/renal funct -f/u clinical progress, c/s, abx de-escalation -F/u abx dot  , PharmD Clinical Pharmacist - Resident Pager [phone] 12/14/2013 12:20 PM

## 2013-12-14 NOTE — Progress Notes (Signed)
Patient ID: Lawrence Jennings, male   DOB: 06/16/1955, 58 y.o.   MRN: [id number]         [facility] for Infectious Disease    Date of Admission:  11/03/2013   Total days of antibiotics 13        Day 13 vancomycin        Day 4 cefepime        Day 4 metronidazole        Day 4 voriconazole Active Problems:   Septic shock   Acidosis   Acute renal failure   . antiseptic oral rinse  7 mL Mouth Rinse QID  . ceFEPime (MAXIPIME) IV  1 g Intravenous Q24H  . chlorhexidine  15 mL Mouth Rinse BID  . famotidine (PEPCID) IV  20 mg Intravenous Q24H  . feeding supplement (PRO-STAT SUGAR FREE 64)  30 mL Per Tube TID  . hydrocortisone sod succinate (SOLU-CORTEF) inj  50 mg Intravenous Q12H  . insulin aspart  2-6 Units Subcutaneous 6 times per day  . ipratropium-albuterol  3 mL Nebulization Q6H  . metoCLOPramide (REGLAN) injection  5 mg Intravenous 4 times per day  . metronidazole  500 mg Intravenous Q8H  . sennosides  5 mL Per Tube BID  . voriconazole  200 mg Per Tube Q12H    Objective: Temp:  [97.4 F (36.3 C)-98.4 F (36.9 C)] 98.3 F (36.8 C) (08/12 1318) Pulse Rate:  [114-130] 126 (08/12 1605) Resp:  [32-37] 33 (08/12 1443) BP: (87-131)/(52-80) 96/68 mmHg (08/12 1605) SpO2:  [89 %-100 %] 93 % (08/12 1605) Arterial Line BP: (99-127)/(51-66) 104/54 mmHg (08/12 1600) FiO2 (%):  [50 %-60 %] 50 % (08/12 1443) Weight:  [209 lb 14.1 oz (95.2 kg)] 209 lb 14.1 oz (95.2 kg) (08/12 0500)  General: He remains sedated and paralyzed on the ventilator Eyes: Bilateral lid edema Lungs: Few rhonchi Cor: Regular S1 and S2 no murmurs  Lab Results Lab Results  Component Value Date   WBC 17.2* 12/14/2013   HGB 7.2* 12/14/2013   HCT 22.3* 12/14/2013   MCV 83.8 12/14/2013   PLT 72* 12/14/2013    Lab Results  Component Value Date   CREATININE 3.29* 12/14/2013   BUN 69* 12/14/2013   NA 140 12/14/2013   K 3.8 12/14/2013   CL 105 12/14/2013   CO2 16* 12/14/2013    Lab Results  Component Value Date     ALT 12 12/08/2013   AST 92* 12/08/2013   ALKPHOS 154* 12/08/2013   BILITOT 0.5 12/08/2013    Vancomycin trough 12/13/2013: 43   Microbiology: Recent Results (from the past 240 hour(s))  CULTURE, RESPIRATORY (NON-EXPECTORATED)     Status: None   Collection Time    12/07/13 12:31 PM      Result Value Ref Range Status   Specimen Description TRACHEAL ASPIRATE   Final   Special Requests Immunocompromised   Final   Gram Stain     Final   Value: FEW WBC PRESENT,BOTH PMN AND MONONUCLEAR     RARE SQUAMOUS EPITHELIAL CELLS PRESENT     NO ORGANISMS SEEN     Performed at [facility]   Culture     Final   Value: FEW KLEBSIELLA PNEUMONIAE     Performed at [facility]   Report Status 12/08/2013 FINAL   Final   Organism ID, Bacteria KLEBSIELLA PNEUMONIAE   Final  PNEUMOCYSTIS JIROVECI SMEAR BY DFA     Status: None   Collection Time  12/07/13 12:38 PM      Result Value Ref Range Status   Specimen Source-PJSRC ENDOTRACHEAL   Final   Pneumocystis jiroveci Ag NEGATIVE   Final   Comment: Performed at [facility]  CULTURE, BAL-QUANTITATIVE     Status: None   Collection Time    12/19/2013 12:17 PM      Result Value Ref Range Status   Specimen Description BRONCHIAL ALVEOLAR LAVAGE   Final   Special Requests Immunocompromised   Final   Gram Stain     Final   Value: FEW WBC PRESENT,BOTH PMN AND MONONUCLEAR     NO SQUAMOUS EPITHELIAL CELLS SEEN     NO ORGANISMS SEEN     Performed at [facility] Count     Final   Value: 10,000 COLONIES/ML     Performed at [facility]   Culture     Final   Value: Non-Pathogenic Oropharyngeal-type Flora Isolated.     Performed at [facility]   Report Status 12/11/2013 FINAL   Final  FUNGUS CULTURE W SMEAR     Status: None   Collection Time    12/18/2013 12:21 PM      Result Value Ref Range Status   Specimen Description BRONCHIAL ALVEOLAR LAVAGE   Final   Special Requests Immunocompromised   Final    Fungal Smear     Final   Value: NO YEAST OR FUNGAL ELEMENTS SEEN     Performed at [facility]   Culture     Final   Value: CANDIDA ALBICANS     Performed at [facility]   Report Status PENDING   Incomplete    Studies/Results: Dg Abd 1 View  12/13/2013   CLINICAL DATA:  Feeding tube placement.  EXAM: ABDOMEN - 1 VIEW  COMPARISON:  12/13/2013  FINDINGS: Attempt was made to pass feeding tube into the duodenum. Despite contrast injection and approximately 8 min of fluoroscopy time, the pylorus could not be traversed. Therefore, the feeding tube was left in the gastric antrum.  IMPRESSION: Attempted feeding tube placement by the technologist. The tube was left in the gastric antrum.   Electronically Signed   By: Jeronimo Greaves M.D.   On: 12/13/2013 16:24   Dg Chest Port 1 View  12/13/2013   CLINICAL DATA:  58 year old male with septic shock. Intubated. Initial encounter.  EXAM: PORTABLE CHEST - 1 VIEW  COMPARISON:  12/12/2013 and earlier.  FINDINGS: Portable AP semi upright view at 0524 hrs. Endotracheal tube tip in stable position just below the clavicles. Feeding tube courses to the left upper quadrant, tip partially included. Stable left IJ central line. Stable cardiac size and mediastinal contours. Bilateral confluent and veiling pulmonary opacity greatest at the lung bases. Ventilation is stable. No pneumothorax.  IMPRESSION: 1.  Stable lines and tubes. 2. Stable ventilation with bilateral pleural effusions, lower lobe collapse or consolidation, and superimposed widespread pulmonary opacity.   Electronically Signed   By: Augusto Gamble M.D.   On: 12/13/2013 07:45   Dg Abd Portable 1v  12/13/2013   CLINICAL DATA:  Ileus versus bowel obstruction.  EXAM: PORTABLE ABDOMEN - 1 VIEW  COMPARISON:  None.  FINDINGS: Partial visualization of the lungs demonstrates diffuse airspace disease at the bases with cavitation in the RIGHT mid lung.  Feeding tube is present with the tip in the proximal  gastric fundus. Bilateral pleural effusions are present. No gross plain film evidence  of free air. The bowel gas pattern appears nonobstructive with gas in the transverse colon. No dilated loops of small bowel are identified. Rectal third Mr. noted. Gas is present along the course of the descending colon. LEFT hip osteoarthritis.  IMPRESSION: 1. Nonobstructive bowel gas pattern. 2. Feeding tube with the tip in the gastric fundus. 3. Diffuse airspace disease in the lungs with cavitary RIGHT upper lobe lesion. Small bilateral pleural effusions.   Electronically Signed   By: Andreas Newport M.D.   On: 12/13/2013 09:36   Dg Vangie Bicker G Tube Plc W/fl-no Rad  12/13/2013   CLINICAL DATA: Failed attempt to feed r/t increased residuals   NASO G TUBE PLACEMENT WITH FLUORO  Fluoroscopy was utilized by the requesting physician.  No radiographic  interpretation.     Assessment: He continues to do very poorly and now has worsening renal function. His vancomycin and is supratherapeutic and certainly may be a cause for his renal insufficiency. He has not grown MRSA and has had 13 days of therapy with vancomycin so I will stop it now. I am not as concerned about potential nephrotoxicity with oral voriconazole. I have attached below the section addressing potential nephrotoxicity with IV voriconazole from [employer].  "There has been concern about the potential for nephrotoxicity of IV voriconazole in patients with renal dysfunction because the IV formulation contains a cyclodextrin vehicle, sulphobutylether-beta-cyclodextrin (SBECD); SBECD is a solubilizing agent that is renally cleared and that has been associated with nephrotoxicity in rats as a result of renal tubule vacuolation [73,74]. The manufacturer has recommended that IV voriconazole be avoided in patients with renal insufficiency (CrCl <50 mL/min) [28]. However, in a retrospective study that evaluated renal function in 166 patients receiving IV or oral voriconazole  (one-quarter of whom had a glomerular filtration rate <50 mL/min at baseline and received IV voriconazole), neither baseline renal function nor route of administration was associated with worsening renal function [30]. Limitations of this study are that it was a small study and that few patients received voriconazole for ?7 days; it is possible that a longer duration of therapy in patients with preexisting renal dysfunction is more nephrotoxic than a shorter course. Further study is necessary to determine whether IV voriconazole is nephrotoxic in patients with preexisting renal dysfunction. (See 'Voriconazole' below.)"  I am comfortable continuing oral vancomycin for now along with his other antibiotics.  Plan: 1. Discontinue vancomycin 2. Continue cefepime, metronidazole and voriconazole  Cliffton Asters, MD [facility] for Infectious Disease [facility] [phone] pager   [phone] cell 12/14/2013, 4:25 PM

## 2013-12-14 NOTE — Progress Notes (Signed)
Subjective: Interval History: none.  Objective: Vital signs in last 24 hours: Temp:  [97.3 F (36.3 C)-98.4 F (36.9 C)] 98.4 F (36.9 C) (08/12 0434) Pulse Rate:  [101-129] 129 (08/12 0700) Resp:  [30-36] 36 (08/12 0538) BP: (97-166)/(55-80) 111/59 mmHg (08/12 0538) SpO2:  [88 %-100 %] 92 % (08/12 0700) Arterial Line BP: (99-155)/(54-66) 99/54 mmHg (08/12 0600) FiO2 (%):  [50 %-60 %] 60 % (08/12 0600) Weight:  [95.2 kg (209 lb 14.1 oz)] 95.2 kg (209 lb 14.1 oz) (08/12 0500) Weight change: 1.2 kg (2 lb 10.3 oz)  Intake/Output from previous day: 08/11 0701 - 08/12 0700 In: 2102.2 [I.V.:1444.2; IV Piggyback:658] Out: 95 [Urine:95] Intake/Output this shift:    General appearance: sedated, paralyzed, dysconjugate gaze Resp: rales bilaterally and rhonchi bilaterally Cardio: S1, S2 normal and systolic murmur: holosystolic 2/6, blowing at apex GI: no bs, soft, liver down 5 cm, mod distension Extremities: edema 4+  Lab Results:  Recent Labs  12/12/13 0400 12/13/13 0238  WBC 13.7* 17.9*  HGB 7.1* 7.3*  HCT 22.0* 22.1*  PLT 51* 64*   BMET:  Recent Labs  12/13/13 0238 12/14/13 0450  NA 137 140  K 3.7 3.8  CL 104 105  CO2 18* 16*  GLUCOSE 142* 104*  BUN 57* 69*  CREATININE 2.81* 3.29*  CALCIUM 6.2* 6.4*   No results found for this basename: PTH,  in the last 72 hours Iron Studies: No results found for this basename: IRON, TIBC, TRANSFERRIN, FERRITIN,  in the last 72 hours  Studies/Results: Dg Abd 1 View  12/13/2013   CLINICAL DATA:  Feeding tube placement.  EXAM: ABDOMEN - 1 VIEW  COMPARISON:  12/13/2013  FINDINGS: Attempt was made to pass feeding tube into the duodenum. Despite contrast injection and approximately 8 min of fluoroscopy time, the pylorus could not be traversed. Therefore, the feeding tube was left in the gastric antrum.  IMPRESSION: Attempted feeding tube placement by the technologist. The tube was left in the gastric antrum.   Electronically Signed    By: Jeronimo Greaves M.D.   On: 12/13/2013 16:24   Dg Chest Port 1 View  12/13/2013   CLINICAL DATA:  58 year old male with septic shock. Intubated. Initial encounter.  EXAM: PORTABLE CHEST - 1 VIEW  COMPARISON:  12/12/2013 and earlier.  FINDINGS: Portable AP semi upright view at 0524 hrs. Endotracheal tube tip in stable position just below the clavicles. Feeding tube courses to the left upper quadrant, tip partially included. Stable left IJ central line. Stable cardiac size and mediastinal contours. Bilateral confluent and veiling pulmonary opacity greatest at the lung bases. Ventilation is stable. No pneumothorax.  IMPRESSION: 1.  Stable lines and tubes. 2. Stable ventilation with bilateral pleural effusions, lower lobe collapse or consolidation, and superimposed widespread pulmonary opacity.   Electronically Signed   By: Augusto Gamble M.D.   On: 12/13/2013 07:45   Dg Abd Portable 1v  12/13/2013   CLINICAL DATA:  Ileus versus bowel obstruction.  EXAM: PORTABLE ABDOMEN - 1 VIEW  COMPARISON:  None.  FINDINGS: Partial visualization of the lungs demonstrates diffuse airspace disease at the bases with cavitation in the RIGHT mid lung.  Feeding tube is present with the tip in the proximal gastric fundus. Bilateral pleural effusions are present. No gross plain film evidence of free air. The bowel gas pattern appears nonobstructive with gas in the transverse colon. No dilated loops of small bowel are identified. Rectal third Mr. noted. Gas is present along the course of the  descending colon. LEFT hip osteoarthritis.  IMPRESSION: 1. Nonobstructive bowel gas pattern. 2. Feeding tube with the tip in the gastric fundus. 3. Diffuse airspace disease in the lungs with cavitary RIGHT upper lobe lesion. Small bilateral pleural effusions.   Electronically Signed   By: Andreas Newport M.D.   On: 12/13/2013 09:36   Dg Vangie Bicker G Tube Plc W/fl-no Rad  12/13/2013   CLINICAL DATA: Failed attempt to feed r/t increased residuals   NASO G  TUBE PLACEMENT WITH FLUORO  Fluoroscopy was utilized by the requesting physician.  No radiographic  interpretation.     I have reviewed the patient's current medications.  Assessment/Plan: 1 AKI drug induced.  Worsening acidemia, severe vol xs,oliguria, rising Cr.  Need to consider CRRT soon, depending on goals of care.  Rec d/c Voriconazole.  Vanc most likely but Vori can also. No response to Lasix. 2 Anemia 3 VDRF 4 Septic syndrome stable 5 Psoriasis 6 FTT P CRRT depending on goals of care, stop IVF, D/C vanc, ? Voriconazole   LOS: 12 days   Fionn Stracke L 12/14/2013,7:25 AM

## 2013-12-14 NOTE — Progress Notes (Signed)
PULMONARY / CRITICAL CARE MEDICINE   Name: Lawrence Jennings MRN: [id number] DOB: 11-30-1955    ADMISSION DATE:  11/18/2013 CONSULTATION DATE:  12/03/13   REFERRING MD :  ED  CHIEF COMPLAINT:  AMS  INITIAL PRESENTATION: 58 y/o M with PMH of psoriatic arthritis on Humira + Pred, recent admission (7/3-7/7) for FTT & cellulitis (doxy) who presented to [facility] ER on 7/31 after being found by family with AMS & weakness.  Intubated for hypoxic respiratory failure / ALI.    PMH - mild thrombocytopenia of unknown etiology,diabetes that has been poorly controlled.   STUDIES:  8/2 - Echo EF 60% 8/04 - CT Chest with Pneumomediastinum and RLL cavitary lesion  SIGNIFICANT EVENTS: 8/01 - admitted to ICU for hypoxic respiratory failure 8/03 - levo gtt weaned off 8/06 - breath stacking, changed MV to pressure control, changed sedation, large/dark residuals from OGT 8/07 - small LUL PTX, worsening hypotension and CXR, ARDS protocol started, and paralytics, upper EGD with small gastric ulcers.  8/9 stopped nmb to prevent neuropathy and evaluate further need.  Sedated and  NMB stopped at 0830 12/12/13: On 50% fio2, peep 8; dysnchronous with ventilator. BP running soft. Heading into oligruia 12/13/13: Back on nimbex since yesterday. due to dysnchrony and fio2 improved to 50%, peep 5.  . BP responded to fluids but still oliguric; creat stabilizing. High TF residuals despite reglan and TF stopped. Started reglan scheduled   SUBJECTIVE/OVERNIGHT/INTERVAL HX 12/14/13: Off nimbex x 24h. Hypoxemia better; mild vent dysnchrony +. Re GI: unable to get post pyloric panda. Re renal: creat and bic worsening; meets CRRT indication per renal. Re family: goals of care set for tomorrow  VITAL SIGNS: Temp:  [97.4 F (36.3 C)-98.4 F (36.9 C)] 97.4 F (36.3 C) (08/12 0852) Pulse Rate:  [101-130] 125 (08/12 1100) Resp:  [32-37] 37 (08/12 0759) BP: (97-131)/(55-80) 104/70 mmHg (08/12 0800) SpO2:  [88 %-100 %] 100 % (08/12  1100) Arterial Line BP: (99-155)/(54-66) 99/55 mmHg (08/12 1100) FiO2 (%):  [50 %-60 %] 60 % (08/12 0803) Weight:  [95.2 kg (209 lb 14.1 oz)] 95.2 kg (209 lb 14.1 oz) (08/12 0500)  VENTILATOR SETTINGS: Vent Mode:  [-] PRVC FiO2 (%):  [50 %-60 %] 60 % Set Rate:  [30 bmp] 30 bmp Vt Set:  [450 mL] 450 mL PEEP:  [5 cmH20-10 cmH20] 10 cmH20 Plateau Pressure:  [23 cmH20] 23 cmH20  INTAKE / OUTPUT:  Intake/Output Summary (Last 24 hours) at 12/14/13 1131 Last data filed at 12/14/13 1100  Gross per 24 hour  Intake   1936 ml  Output    100 ml  Net   1836 ml    PHYSICAL EXAMINATION: General: looks critically ill Neuro:  Deeply sedated   HEENT:  Proptosis, PERRL, EOMI, OP clear, dry mucosa, subcutaneous air (crepitus) no change 8/9,multiple missing teeth Cardiovascular:  Tachycardic, regular rhythm, no MRG Lungs: Decreased bs bases, + rhonchi bilateral ,  Diminished BS, no HSM.  Musculoskeletal:  +pedal edema Skin:   hyperpigmented areas on face and chest   LABS:  PULMONARY  Recent Labs Lab 12/11/13 0342 12/12/13 0807 12/12/13 1502 12/12/13 2205 12/13/13 0352  PHART 7.281* 7.323* 7.293* 7.215* 7.225*  PCO2ART 48.4* 38.7 40.1 47.7* 45.2*  PO2ART 54.9* 58.0* 50.0* 69.5* 71.0*  HCO3 22.3 20.2 19.6* 18.6* 18.1*  TCO2 23.8 21 21  20.1 19.4  O2SAT 87.6 89.0 82.0 91.2 91.7    CBC  Recent Labs Lab 12/12/13 0400 12/13/13 0238 12/14/13 0450  HGB 7.1* 7.3* 7.2*  HCT 22.0* 22.1* 22.3*  WBC 13.7* 17.9* 17.2*  PLT 51* 64* PENDING    COAGULATION  Recent Labs Lab 12/12/13 0400  INR 1.47    CARDIAC  No results found for this basename: TROPONINI,  in the last 168 hours  Recent Labs Lab 12/14/13 0450  PROBNP 2132.0*     CHEMISTRY  Recent Labs Lab 12/07/13 1515  12/12/13 0400 12/12/13 1504 12/12/13 2157 12/13/13 0238 12/14/13 0450  NA 161*  < > 143 141 139 137 140  K 3.2*  < > 3.9 4.0 3.7 3.7 3.8  CL 126*  < > 107 107 105 104 105  CO2 25  < > 20 19 18*  18* 16*  GLUCOSE 181*  < > 115* 150* 149* 142* 104*  BUN 24*  < > 50* 55* 56* 57* 69*  CREATININE 0.94  < > 2.61* 2.80* 2.77* 2.81* 3.29*  CALCIUM 6.9*  < > 6.6* 6.4* 6.2* 6.2* 6.4*  MG 1.9  --  1.5  --   --  1.8 1.8  PHOS 2.3  --  5.3*  --   --  5.4* 5.9*  < > = values in this interval not displayed. Estimated Creatinine Clearance: 29.9 ml/min (by C-G formula based on Cr of 3.29).   LIVER  Recent Labs Lab 12/08/13 0545 12/12/13 0400  AST 92*  --   ALT 12  --   ALKPHOS 154*  --   BILITOT 0.5  --   PROT 5.0*  --   ALBUMIN 1.1*  --   INR  --  1.47     INFECTIOUS  Recent Labs Lab 12/31/2013 0425 12/21/2013 1300 12/10/13 1205  LATICACIDVEN 2.3* 2.3* 2.0     ENDOCRINE CBG (last 3)   Recent Labs  12/14/13 12/14/13 0411 12/14/13 0835  GLUCAP 103* 99 91         IMAGING x48h Dg Abd 1 View  12/13/2013   CLINICAL DATA:  Feeding tube placement.  EXAM: ABDOMEN - 1 VIEW  COMPARISON:  12/13/2013  FINDINGS: Attempt was made to pass feeding tube into the duodenum. Despite contrast injection and approximately 8 min of fluoroscopy time, the pylorus could not be traversed. Therefore, the feeding tube was left in the gastric antrum.  IMPRESSION: Attempted feeding tube placement by the technologist. The tube was left in the gastric antrum.   Electronically Signed   By: Jeronimo Greaves M.D.   On: 12/13/2013 16:24   Dg Chest Port 1 View  12/13/2013   CLINICAL DATA:  58 year old male with septic shock. Intubated. Initial encounter.  EXAM: PORTABLE CHEST - 1 VIEW  COMPARISON:  12/12/2013 and earlier.  FINDINGS: Portable AP semi upright view at 0524 hrs. Endotracheal tube tip in stable position just below the clavicles. Feeding tube courses to the left upper quadrant, tip partially included. Stable left IJ central line. Stable cardiac size and mediastinal contours. Bilateral confluent and veiling pulmonary opacity greatest at the lung bases. Ventilation is stable. No pneumothorax.   IMPRESSION: 1.  Stable lines and tubes. 2. Stable ventilation with bilateral pleural effusions, lower lobe collapse or consolidation, and superimposed widespread pulmonary opacity.   Electronically Signed   By: Augusto Gamble M.D.   On: 12/13/2013 07:45   Dg Abd Portable 1v  12/13/2013   CLINICAL DATA:  Ileus versus bowel obstruction.  EXAM: PORTABLE ABDOMEN - 1 VIEW  COMPARISON:  None.  FINDINGS: Partial visualization of the lungs demonstrates diffuse airspace disease at the bases with cavitation in the  RIGHT mid lung.  Feeding tube is present with the tip in the proximal gastric fundus. Bilateral pleural effusions are present. No gross plain film evidence of free air. The bowel gas pattern appears nonobstructive with gas in the transverse colon. No dilated loops of small bowel are identified. Rectal third Mr. noted. Gas is present along the course of the descending colon. LEFT hip osteoarthritis.  IMPRESSION: 1. Nonobstructive bowel gas pattern. 2. Feeding tube with the tip in the gastric fundus. 3. Diffuse airspace disease in the lungs with cavitary RIGHT upper lobe lesion. Small bilateral pleural effusions.   Electronically Signed   By: Andreas Newport M.D.   On: 12/13/2013 09:36   Dg Vangie Bicker G Tube Plc W/fl-no Rad  12/13/2013   CLINICAL DATA: Failed attempt to feed r/t increased residuals   NASO G TUBE PLACEMENT WITH FLUORO  Fluoroscopy was utilized by the requesting physician.  No radiographic  interpretation.        ASSESSMENT / PLAN:  PULMONARY ETT 11/09/2013 >> A:  Acute Hypoxic Resp Failure/ARDS Sepsis\Cavitation on CT with pneumomediastium, concern for Fungal infection Small LUL PTX   - On 8/12/5: after 72h of nimbex, off nimbex x 24h. ,  ARDS persists 60%io2/peep 10 with pulse ox 100%     P:   ARDS protocol to continue; pulse ox goal > 92% PTX is small, but with hypoxia will have to increase PEEP and may place chest tube - for now will monitor with serial cxr and hemodynamics Will need  to consider tracheostomy but ? C/w goals of care  CARDIOVASCULAR CVL:8/1 l i j cvl>> A:  Septic shock - levophed gtt weaned off 8/3 am , currently off pressors Hypotension - on versed and fentanyl   - off pressors since 12/12/13  P:  Reduce fluid intake 12/13/13 and change to d5 half normal saline at 50cc/h  RENAL A:   AKI - Likely secondary to volume depletion and hypotension, worsening.Normal renal US 12/11/13   - worsening renal function 12/12/13 through 12/14/13. Drug induced per renal. Meets indication for CRRT   P:   Await goals of care 12/15/13  before deciding on CRRT per renal but will check abg Trend BMP Monitor UOP   GASTROINTESTINAL A:   Vent associated Dysphagia  ? Aspiration component Increased Gastric Residual / ABD Distention Hypertriglyceridemia    - Ongoing high TF residuals recurrence 12/13/13 and failed to get post pyloric panda in (high TF around 12/05/13, Sp UGIAugust 27, 2015  scope with multiple gastric ulcers related to OG trauma with recommendation to use doboff)    P:   Consider diabetic gastroparesis as cause; do scheduled reglan for few days and then decide on starting tube feeds Hold off Tube feeds 12/14/13 DO NOT PLACE OGT TO SUCTION.   Use doboff PPI   HEMATOLOGIC A:   Thrombocytopenia - Mild - unknown etiology.  Noted since July 2015 Anemia Hgb 7.0 8/9   - improving platelet count 12/13/13 P:  Trend CBC, monitor platelets, avoid toxic drugs.  Transfuse for hgb <7.0 SCDs   INFECTIOUS A:   HCAP Sputum 12/03/13 with Group A beta hemolytic strep, 12/07/13 with Klebsiella- in immunocompromised patient (Humira and prednisone)  - s/p bronchoscopy with BAL 12/29/2013: cutlure negative as of 12/13/13    P:   Rx HCAP now with Cavitary lesion, high concern for fungal infection  BCx2 7/31 >> ng UC 8/1 >> ng Sputum 8/5 >> K. Pneumonia BAL 29-Dec-2013 >>  Abx: ID directing zosyn, start date 8/1>>8/7,  Vanc, start date 8/5 >>  8/12 (due to renal  failure) Micafungin, start date 8/1>>8/7, Flagyl 8/7>> Cefepime 8/7>> Vfend 8/8>>   ENDOCRINE A:   DM II Chronic Steroid Administration  (pharmacy investigation on 12/14/13: patient was being tapered off prednisone and should have completed by end July)  Proptosis - TSH nml  P:   SSI Continue  Low dose hydrocort - for now 50mg  Q12h (he got sick just after pred taper completed)  Lacrilube for eye protection  NEUROLOGIC A:   AMS, No diprivan due to high TGL P:   RASS goal: -3 Daily WUA Versed gtt Fentanyl gtt PRN versed    GLOBAL 12/12/13: No family at bedside. Full code 12/13/13: Per RN: a sister is listed as contact but since admission Dec 03, 2013 no family visits 12/13/13:  One sister  updated by PCCM MD over phone . Palliative care consult for goals called for 12/15/13   TODAY'S SUMMARY: 58 y/o with FTT, psoriatic arthritis admitted with HCAP, AKI, resolved septic shock, RLL\RML cavitation with pneumomediastium on CT chest and LUL PTX on CXR, correcting Hyponatremia\dehydration, treating HCAP . On ARDS protocol . Abx per ID.  Main issues a) renal failure - needs CRRT ; b) high TF residual - rx reglan for few days and then start TF; c) acute heading into chronic resp failure - needs trach; d) low platelets - improving; e) overall very poor prognosis and unlilkely will survive 6-12 months; needs goals of care   The patient is critically ill with multiple organ systems failure and requires high complexity decision making for assessment and support, frequent evaluation and titration of therapies, application of advanced monitoring technologies and extensive interpretation of multiple databases.   Critical Care Time devoted to patient care services described in this note is  35  Minutes.    Dr. Kalman Shan, M.D., [facility].C.P Pulmonary and Critical Care Medicine Staff Physician [facility] [place] Pulmonary and Critical Care Pager: [phone], If no answer or between   15:00h - 7:00h: call [phone]  12/14/2013 11:31 AM

## 2013-12-15 ENCOUNTER — Inpatient Hospital Stay (HOSPITAL_COMMUNITY): Payer: Medicaid Other

## 2013-12-15 DIAGNOSIS — Z9911 Dependence on respirator [ventilator] status: Secondary | ICD-10-CM

## 2013-12-15 DIAGNOSIS — M359 Systemic involvement of connective tissue, unspecified: Secondary | ICD-10-CM

## 2013-12-15 DIAGNOSIS — E872 Acidosis, unspecified: Secondary | ICD-10-CM

## 2013-12-15 DIAGNOSIS — J984 Other disorders of lung: Secondary | ICD-10-CM

## 2013-12-15 DIAGNOSIS — E8809 Other disorders of plasma-protein metabolism, not elsewhere classified: Secondary | ICD-10-CM | POA: Diagnosis present

## 2013-12-15 LAB — COMPREHENSIVE METABOLIC PANEL
ALBUMIN: 1 g/dL — AB (ref 3.5–5.2)
ALT: 17 U/L (ref 0–53)
ANION GAP: 19 — AB (ref 5–15)
AST: 80 U/L — ABNORMAL HIGH (ref 0–37)
Alkaline Phosphatase: 139 U/L — ABNORMAL HIGH (ref 39–117)
BUN: 82 mg/dL — ABNORMAL HIGH (ref 6–23)
CALCIUM: 6.6 mg/dL — AB (ref 8.4–10.5)
CO2: 17 mEq/L — ABNORMAL LOW (ref 19–32)
CREATININE: 3.64 mg/dL — AB (ref 0.50–1.35)
Chloride: 104 mEq/L (ref 96–112)
GFR calc Af Amer: 20 mL/min — ABNORMAL LOW (ref >90)
GFR calc non Af Amer: 17 mL/min — ABNORMAL LOW (ref >90)
Glucose, Bld: 94 mg/dL (ref 70–99)
Potassium: 3.8 mEq/L (ref 3.7–5.3)
Sodium: 140 mEq/L (ref 137–147)
TOTAL PROTEIN: 5.1 g/dL — AB (ref 6.0–8.3)
Total Bilirubin: 0.6 mg/dL (ref 0.3–1.2)

## 2013-12-15 LAB — CBC WITH DIFFERENTIAL/PLATELET
BASOS PCT: 0 % (ref 0–1)
Basophils Absolute: 0 10*3/uL (ref 0.0–0.1)
EOS PCT: 0 % (ref 0–5)
Eosinophils Absolute: 0 10*3/uL (ref 0.0–0.7)
HEMATOCRIT: 21.5 % — AB (ref 39.0–52.0)
HEMOGLOBIN: 7.1 g/dL — AB (ref 13.0–17.0)
Lymphocytes Relative: 1 % — ABNORMAL LOW (ref 12–46)
Lymphs Abs: 0.1 10*3/uL — ABNORMAL LOW (ref 0.7–4.0)
MCH: 27.6 pg (ref 26.0–34.0)
MCHC: 33 g/dL (ref 30.0–36.0)
MCV: 83.7 fL (ref 78.0–100.0)
MONO ABS: 0.2 10*3/uL (ref 0.1–1.0)
MONOS PCT: 1 % — AB (ref 3–12)
Neutro Abs: 13.9 10*3/uL — ABNORMAL HIGH (ref 1.7–7.7)
Neutrophils Relative %: 98 % — ABNORMAL HIGH (ref 43–77)
Platelets: 62 10*3/uL — ABNORMAL LOW (ref 150–400)
RBC: 2.57 MIL/uL — ABNORMAL LOW (ref 4.22–5.81)
RDW: 18.6 % — ABNORMAL HIGH (ref 11.5–15.5)
WBC: 14.2 10*3/uL — ABNORMAL HIGH (ref 4.0–10.5)

## 2013-12-15 LAB — BLOOD GAS, ARTERIAL
Acid-base deficit: 9 mmol/L — ABNORMAL HIGH (ref 0.0–2.0)
BICARBONATE: 17 meq/L — AB (ref 20.0–24.0)
DRAWN BY: 41977
FIO2: 0.5 %
O2 SAT: 89 %
PEEP: 10 cmH2O
Patient temperature: 98.6
RATE: 30 resp/min
TCO2: 18.3 mmol/L (ref 0–100)
VT: 450 mL
pCO2 arterial: 41 mmHg (ref 35.0–45.0)
pH, Arterial: 7.241 — ABNORMAL LOW (ref 7.350–7.450)
pO2, Arterial: 64.4 mmHg — ABNORMAL LOW (ref 80.0–100.0)

## 2013-12-15 LAB — GLUCOSE, CAPILLARY
GLUCOSE-CAPILLARY: 86 mg/dL (ref 70–99)
Glucose-Capillary: 104 mg/dL — ABNORMAL HIGH (ref 70–99)
Glucose-Capillary: 86 mg/dL (ref 70–99)
Glucose-Capillary: 91 mg/dL (ref 70–99)
Glucose-Capillary: 95 mg/dL (ref 70–99)

## 2013-12-15 LAB — PHOSPHORUS: Phosphorus: 6.8 mg/dL — ABNORMAL HIGH (ref 2.3–4.6)

## 2013-12-15 LAB — MAGNESIUM: Magnesium: 1.8 mg/dL (ref 1.5–2.5)

## 2013-12-15 NOTE — Progress Notes (Addendum)
D/w Dr Phillips Odor   - ful DNAR - no CRRT  - No LTAC  - No trach  - terminal wean on Saturday Jan 01, 2014 AM; under guidance of palliative care  - dc labs - dc reglan; do not reinitiate tube feeds   -maintain basic medical measures; focus on concurrent palliation  Appreciate palliative services input  Dr. Kalman Shan, M.D., [facility].C.P Pulmonary and Critical Care Medicine Staff Physician [facility] [place] Pulmonary and Critical Care Pager: [phone], If no answer or between  15:00h - 7:00h: call [phone]  12/15/2013 7:02 PM

## 2013-12-15 NOTE — Progress Notes (Signed)
Patient ID: Lawrence Jennings, male   DOB: 09-27-1955, 58 y.o.   MRN: [id number]         [facility] for Infectious Disease    Date of Admission:  11/27/2013   Total days of antibiotics 14        Day 5 cefepime        Day 5 metronidazole        Day 5 voriconazole Active Problems:   Septic shock   Acidosis   Acute renal failure   . antiseptic oral rinse  7 mL Mouth Rinse QID  . ceFEPime (MAXIPIME) IV  1 g Intravenous Q24H  . chlorhexidine  15 mL Mouth Rinse BID  . famotidine (PEPCID) IV  20 mg Intravenous Q24H  . hydrocortisone sod succinate (SOLU-CORTEF) inj  50 mg Intravenous Q12H  . insulin aspart  2-6 Units Subcutaneous 6 times per day  . ipratropium-albuterol  3 mL Nebulization Q6H  . metoCLOPramide (REGLAN) injection  5 mg Intravenous 4 times per day  . metronidazole  500 mg Intravenous Q8H  . sennosides  5 mL Per Tube BID  . voriconazole  200 mg Per Tube Q12H    Objective: Temp:  [97.7 F (36.5 C)-98.7 F (37.1 C)] 97.7 F (36.5 C) (08/13 1245) Pulse Rate:  [119-134] 120 (08/13 1400) Resp:  [21-35] 30 (08/13 1219) BP: (87-117)/(58-68) 109/58 mmHg (08/13 1219) SpO2:  [88 %-100 %] 99 % (08/13 1400) Arterial Line BP: (98-151)/(54-77) 98/58 mmHg (08/13 1400) FiO2 (%):  [50 %] 50 % (08/13 1400) Weight:  [209 lb 3.5 oz (94.9 kg)] 209 lb 3.5 oz (94.9 kg) (08/13 0402)  General: He remains sedated and paralyzed on the ventilator  Lab Results Lab Results  Component Value Date   WBC 14.2* 12/15/2013   HGB 7.1* 12/15/2013   HCT 21.5* 12/15/2013   MCV 83.7 12/15/2013   PLT 62* 12/15/2013    Lab Results  Component Value Date   CREATININE 3.64* 12/15/2013   BUN 82* 12/15/2013   NA 140 12/15/2013   K 3.8 12/15/2013   CL 104 12/15/2013   CO2 17* 12/15/2013    Lab Results  Component Value Date   ALT 17 12/15/2013   AST 80* 12/15/2013   ALKPHOS 139* 12/15/2013   BILITOT 0.6 12/15/2013    Vancomycin trough 12/13/2013: 43   Microbiology: Recent Results (from the past 240  hour(s))  CULTURE, RESPIRATORY (NON-EXPECTORATED)     Status: None   Collection Time    12/07/13 12:31 PM      Result Value Ref Range Status   Specimen Description TRACHEAL ASPIRATE   Final   Special Requests Immunocompromised   Final   Gram Stain     Final   Value: FEW WBC PRESENT,BOTH PMN AND MONONUCLEAR     RARE SQUAMOUS EPITHELIAL CELLS PRESENT     NO ORGANISMS SEEN     Performed at [facility]   Culture     Final   Value: FEW KLEBSIELLA PNEUMONIAE     Performed at [facility]   Report Status 12/16/2013 FINAL   Final   Organism ID, Bacteria KLEBSIELLA PNEUMONIAE   Final  PNEUMOCYSTIS JIROVECI SMEAR BY DFA     Status: None   Collection Time    12/07/13 12:38 PM      Result Value Ref Range Status   Specimen Source-PJSRC ENDOTRACHEAL   Final   Pneumocystis jiroveci Ag NEGATIVE   Final   Comment: Performed at [facility]  CULTURE, BAL-QUANTITATIVE     Status: None   Collection Time    12/16/2013 12:17 PM      Result Value Ref Range Status   Specimen Description BRONCHIAL ALVEOLAR LAVAGE   Final   Special Requests Immunocompromised   Final   Gram Stain     Final   Value: FEW WBC PRESENT,BOTH PMN AND MONONUCLEAR     NO SQUAMOUS EPITHELIAL CELLS SEEN     NO ORGANISMS SEEN     Performed at [facility] Count     Final   Value: 10,000 COLONIES/ML     Performed at [facility]   Culture     Final   Value: Non-Pathogenic Oropharyngeal-type Flora Isolated.     Performed at [facility]   Report Status 12/11/2013 FINAL   Final  FUNGUS CULTURE W SMEAR     Status: None   Collection Time    12/08/2013 12:21 PM      Result Value Ref Range Status   Specimen Description BRONCHIAL ALVEOLAR LAVAGE   Final   Special Requests Immunocompromised   Final   Fungal Smear     Final   Value: NO YEAST OR FUNGAL ELEMENTS SEEN     Performed at [facility]   Culture     Final   Value: CANDIDA ALBICANS     Performed at  [facility]   Report Status PENDING   Incomplete    Studies/Results: Dg Abd 1 View  12/13/2013   CLINICAL DATA:  Feeding tube placement.  EXAM: ABDOMEN - 1 VIEW  COMPARISON:  12/13/2013  FINDINGS: Attempt was made to pass feeding tube into the duodenum. Despite contrast injection and approximately 8 min of fluoroscopy time, the pylorus could not be traversed. Therefore, the feeding tube was left in the gastric antrum.  IMPRESSION: Attempted feeding tube placement by the technologist. The tube was left in the gastric antrum.   Electronically Signed   By: Jeronimo Greaves M.D.   On: 12/13/2013 16:24   Dg Chest Port 1 View  12/15/2013   CLINICAL DATA:  Intubated patient.  EXAM: PORTABLE CHEST - 1 VIEW  COMPARISON:  Plain film of the chest 12/13/2013 and 12/12/2013.  FINDINGS: Support tubes and lines are unchanged. Left worse than right airspace disease persists but appears improved over the past 2 days. There appears to be a left pleural effusion. No pneumothorax is identified. Cavitary lesion in the right mid lung is again seen. Gas the soft tissues of the neck compatible with pneumomediastinum seen on prior studies also appears improved.  IMPRESSION: Persistent but improved bilateral airspace disease compatible with ARDS or multifocal pneumonia.  Improvement pneumomediastinum.   Electronically Signed   By: Drusilla Kanner M.D.   On: 12/15/2013 08:01   Dg Vangie Bicker G Tube Plc W/fl-no Rad  12/13/2013   CLINICAL DATA: Failed attempt to feed r/t increased residuals   NASO G TUBE PLACEMENT WITH FLUORO  Fluoroscopy was utilized by the requesting physician.  No radiographic  interpretation.     Assessment: He continues to do very poorly. I will continue current antimicrobial therapy pending results of today's goals of care meeting with his family.  Plan: 1. Continue cefepime, metronidazole and voriconazole  Cliffton Asters, MD [facility] for Infectious Disease [facility] [phone]  pager   [phone] cell 12/15/2013, 3:00 PM

## 2013-12-15 NOTE — Progress Notes (Signed)
PULMONARY / CRITICAL CARE MEDICINE   Name: Lawrence Jennings MRN: [id number] DOB: 16-Oct-1955    ADMISSION DATE:  11/24/2013 CONSULTATION DATE:  12/03/13   REFERRING MD :  ED  CHIEF COMPLAINT:  AMS  INITIAL PRESENTATION: 58 y/o M with PMH of psoriatic arthritis on Humira + Pred, recent admission (7/3-7/7) for FTT & cellulitis (doxy) who presented to [facility] ER on 7/31 after being found by family with AMS & weakness.  Intubated for hypoxic respiratory failure / ALI.    PMH - mild thrombocytopenia of unknown etiology,diabetes that has been poorly controlled.   STUDIES:  8/2 - Echo EF 60% 8/04 - CT Chest with Pneumomediastinum and RLL cavitary lesion  SIGNIFICANT EVENTS: 8/01 - admitted to ICU for hypoxic respiratory failure 8/03 - levo gtt weaned off 8/06 - breath stacking, changed MV to pressure control, changed sedation, large/dark residuals from OGT 8/07 - small LUL PTX, worsening hypotension and CXR, ARDS protocol started, and paralytics, upper EGD with small gastric ulcers.  8/9 stopped nmb to prevent neuropathy and evaluate further need.  Sedated and  NMB stopped at 0830 12/12/13: On 50% fio2, peep 8; dysnchronous with ventilator. BP running soft. Heading into oligruia 12/13/13: Back on nimbex since yesterday. due to dysnchrony and fio2 improved to 50%, peep 5.  . BP responded to fluids but still oliguric; creat stabilizing. High TF residuals despite reglan and TF stopped. Started reglan scheduled  12/14/13: Off nimbex x 24h. Hypoxemia better; mild vent dysnchrony +. Re GI: unable to get post pyloric panda. Re renal: creat and bic worsening; meets CRRT indication per renal. Re family: goals of care set for tomorrow   SUBJECTIVE/OVERNIGHT/INTERVAL HX 12/15/13: Palliative care goals of care today. Renal function worse, anuric  VITAL SIGNS: Temp:  [97.8 F (36.6 C)-98.7 F (37.1 C)] 98 F (36.7 C) (08/13 0801) Pulse Rate:  [119-134] 121 (08/13 0900) Resp:  [21-36] 31 (08/13 0900) BP:  (87-117)/(52-74) 117/59 mmHg (08/13 0850) SpO2:  [88 %-100 %] 95 % (08/13 0900) Arterial Line BP: (93-151)/(50-77) 106/56 mmHg (08/13 0900) FiO2 (%):  [50 %] 50 % (08/13 0850) Weight:  [94.9 kg (209 lb 3.5 oz)] 94.9 kg (209 lb 3.5 oz) (08/13 0402)  VENTILATOR SETTINGS: Vent Mode:  [-] PRVC FiO2 (%):  [50 %] 50 % Set Rate:  [30 bmp] 30 bmp Vt Set:  [450 mL] 450 mL PEEP:  [10 cmH20] 10 cmH20 Plateau Pressure:  [15 cmH20-21 cmH20] 21 cmH20  INTAKE / OUTPUT:  Intake/Output Summary (Last 24 hours) at 12/15/13 1049 Last data filed at 12/15/13 0800  Gross per 24 hour  Intake 770.88 ml  Output     41 ml  Net 729.88 ml    PHYSICAL EXAMINATION: General: looks critically ill Neuro:  Deeply sedated   HEENT:  Proptosis, PERRL, EOMI, OP clear, dry mucosa, subcutaneous air (crepitus) no change 8/9,multiple missing teeth Cardiovascular:  Tachycardic, regular rhythm, no MRG Lungs: Decreased bs bases, + rhonchi bilateral ,  Diminished BS, no HSM.  Musculoskeletal:  +pedal edema,. Rt Foot cold and purplish toes + Skin:   hyperpigmented areas on face and chest   LABS:  PULMONARY  Recent Labs Lab 12/12/13 1502 12/12/13 2205 12/13/13 0352 12/14/13 1158 12/15/13 0358  PHART 7.293* 7.215* 7.225* 7.250* 7.241*  PCO2ART 40.1 47.7* 45.2* 40.6 41.0  PO2ART 50.0* 69.5* 71.0* 59.0* 64.4*  HCO3 19.6* 18.6* 18.1* 17.8* 17.0*  TCO2 21 20.1 19.4 19 18.3  O2SAT 82.0 91.2 91.7 86.0 89.0    CBC  Recent  Labs Lab 12/13/13 0238 12/14/13 0450 12/15/13 0400  HGB 7.3* 7.2* 7.1*  HCT 22.1* 22.3* 21.5*  WBC 17.9* 17.2* 14.2*  PLT 64* 72* 62*    COAGULATION  Recent Labs Lab 12/12/13 0400  INR 1.47    CARDIAC  No results found for this basename: TROPONINI,  in the last 168 hours  Recent Labs Lab 12/14/13 0450  PROBNP 2132.0*     CHEMISTRY  Recent Labs Lab 12/12/13 0400 12/12/13 1504 12/12/13 2157 12/13/13 0238 12/14/13 0450 12/15/13 0400  NA 143 141 139 137 140 140  K  3.9 4.0 3.7 3.7 3.8 3.8  CL 107 107 105 104 105 104  CO2 20 19 18* 18* 16* 17*  GLUCOSE 115* 150* 149* 142* 104* 94  BUN 50* 55* 56* 57* 69* 82*  CREATININE 2.61* 2.80* 2.77* 2.81* 3.29* 3.64*  CALCIUM 6.6* 6.4* 6.2* 6.2* 6.4* 6.6*  MG 1.5  --   --  1.8 1.8 1.8  PHOS 5.3*  --   --  5.4* 5.9* 6.8*   Estimated Creatinine Clearance: 26.9 ml/min (by C-G formula based on Cr of 3.64).    LIVER  Recent Labs Lab 12/12/13 0400 12/15/13 0400  AST  --  80*  ALT  --  17  ALKPHOS  --  139*  BILITOT  --  0.6  PROT  --  5.1*  ALBUMIN  --  1.0*  INR 1.47  --      INFECTIOUS  Recent Labs Lab 12/26/2013 0425 12/08/2013 1300 12/10/13 1205  LATICACIDVEN 2.3* 2.3* 2.0     ENDOCRINE CBG (last 3)   Recent Labs  12/15/13 0007 12/15/13 0401 12/15/13 0756  GLUCAP 104* 95 91         IMAGING x48h Dg Abd 1 View  12/13/2013   CLINICAL DATA:  Feeding tube placement.  EXAM: ABDOMEN - 1 VIEW  COMPARISON:  12/13/2013  FINDINGS: Attempt was made to pass feeding tube into the duodenum. Despite contrast injection and approximately 8 min of fluoroscopy time, the pylorus could not be traversed. Therefore, the feeding tube was left in the gastric antrum.  IMPRESSION: Attempted feeding tube placement by the technologist. The tube was left in the gastric antrum.   Electronically Signed   By: Jeronimo Greaves M.D.   On: 12/13/2013 16:24   Dg Chest Port 1 View  12/15/2013   CLINICAL DATA:  Intubated patient.  EXAM: PORTABLE CHEST - 1 VIEW  COMPARISON:  Plain film of the chest 12/13/2013 and 12/12/2013.  FINDINGS: Support tubes and lines are unchanged. Left worse than right airspace disease persists but appears improved over the past 2 days. There appears to be a left pleural effusion. No pneumothorax is identified. Cavitary lesion in the right mid lung is again seen. Gas the soft tissues of the neck compatible with pneumomediastinum seen on prior studies also appears improved.  IMPRESSION: Persistent but  improved bilateral airspace disease compatible with ARDS or multifocal pneumonia.  Improvement pneumomediastinum.   Electronically Signed   By: Drusilla Kanner M.D.   On: 12/15/2013 08:01   Dg Vangie Bicker G Tube Plc W/fl-no Rad  12/13/2013   CLINICAL DATA: Failed attempt to feed r/t increased residuals   NASO G TUBE PLACEMENT WITH FLUORO  Fluoroscopy was utilized by the requesting physician.  No radiographic  interpretation.        ASSESSMENT / PLAN:  PULMONARY ETT 25-Dec-2013 >> A:  Acute Hypoxic Resp Failure/ARDS - s/p 72h nimbex ending 12/13/13 Sepsis\Cavitation on CT with  pneumomediastium, concern for Fungal infection Small LUL PTX   - On 8/13/5:   ARDS persists 50%io2/peep 10 with pulse ox 100%     P:   ARDS protocol to continue; pulse ox goal > 92% PTX is small, but with hypoxia will have to increase PEEP and may place chest tube - for now will monitor with serial cxr and hemodynamics Will need to consider tracheostomy but ? C/w goals of care  CARDIOVASCULAR CVL:8/1 l i j cvl>> A:  Septic shock - levophed gtt weaned off 8/3 am , currently off pressors Hypotension - on versed and fentanyl   - off pressors since 12/12/13  P:  D5 half normal saline at 50cc/h  RENAL A:   AKI - Likely secondary to volume depletion and hypotension, worsening.Normal renal US 12/11/13   - worsening renal function 12/15/13. Drug induced  per renal. Meets indication for CRRT   P:   Await goals of care 12/15/13  before deciding on CRRT per renal  Trend BMP Monitor UOP   GASTROINTESTINAL A:   Vent associated Dysphagia  ? Aspiration component Increased Gastric Residual / ABD Distention Hypertriglyceridemia    - Ongoing high TF residuals recurrence 12/13/13 and failed to get post pyloric panda in (high TF around 12/05/13, Sp UGI08/21/2015  scope with multiple gastric ulcers related to OG trauma with recommendation to use doboff)    P:   Consider diabetic gastroparesis as cause; do scheduled reglan for  few days starting 12/14/13 and then decide on starting tube feeds Hold off Tube feeds 12/15/13 and restart 12/16/13 depending on goals of care  DO NOT PLACE OGT TO SUCTION.   Use doboff PPI   HEMATOLOGIC A:   Thrombocytopenia - Mild - unknown etiology.  Noted since July 2015 Anemia Hgb 7.0 8/9   - improving platelet count 12/13/13 P:  Trend CBC, monitor platelets, avoid toxic drugs.  Transfuse for hgb <7.0 SCDs   INFECTIOUS A:   HCAP Sputum 12/03/13 with Group A beta hemolytic strep, 12/07/13 with Klebsiella- in immunocompromised patient (Humira and prednisone)  P:   Rx HCAP now with Cavitary lesion, high concern for fungal infection  BCx2 7/31 >> ng UC 8/1 >> ng Sputum 8/5 >> K. Pneumonia BAL 12/11/2013 >> 91% polys, culture negative  Abx: ID directing zosyn, start date 8/1>>8/7,  Vanc, start date 8/5 >>  8/12 (due to renal failure) Micafungin, start date 8/1>>8/7, Flagyl 8/7>> Cefepime 8/7>> Oral Vfend 8/8>>   ENDOCRINE A:   DM II Chronic Steroid Administration  (pharmacy investigation on 12/14/13: patient was being tapered off prednisone and should have completed by end July)  Proptosis - TSH nml  P:   SSI Continue  Low dose hydrocort - for now 50mg  Q12h (he got sick just after pred taper completed)  Lacrilube for eye protection  NEUROLOGIC A:   AMS, No diprivan due to high TGL    - deeply sedated. WUA off fentanyl and versed but still RASS -3 P:   RASS goal: -3 Daily WUA Versed gtt to dc Fentanyl gtt PRN versed    GLOBAL 12/12/13: No family at bedside. Full code 12/13/13: Per RN: a sister is listed as contact but since admission 11/23/2013 no family visits 12/13/13:  One sister  updated by PCCM MD over phone . Palliative care consult for goals called for 12/15/13 12/15/13: Palliative care goals today   TODAY'S SUMMARY: 58 y/o with FTT, psoriatic arthritis admitted with HCAP, AKI, resolved septic shock, RLL\RML cavitation with pneumomediastium on  CT chest and LUL  PTX on CXR, correcting Hyponatremia\dehydration, treating HCAP . On ARDS protocol . Abx per ID.  Main issues a) renal failure - needs CRRT ; b) high TF residual - rx reglan for few days and then start TF; c) acute heading into chronic resp failure - needs trach; d) low platelets - improving; e) overall very poor prognosis and unlilkely will survive 6-12 months; needs goals of care   The patient is critically ill with multiple organ systems failure and requires high complexity decision making for assessment and support, frequent evaluation and titration of therapies, application of advanced monitoring technologies and extensive interpretation of multiple databases.   Critical Care Time devoted to patient care services described in this note is  35  Minutes.    Dr. Kalman Shan, M.D., [facility] [address].C.P Pulmonary and Critical Care Medicine Staff Physician [facility] Pager: [phone], If no answer or between  15:00h - 7:00h: call [phone]  12/15/2013 10:49 AM

## 2013-12-15 NOTE — Progress Notes (Addendum)
Fentanyl bag draining from top of spike. Bag taken down and replacement bag requested from pharmacy. 100 cc wasted with Thayer Ohm, RN). Pharmacist, Karma Ganja Sabat witnessed leaking bag of fentanyl with Thayer Ohm.

## 2013-12-15 NOTE — Consult Note (Signed)
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 His illness began with a diffuse rash, fever, joint pain, sinusitis and PNA. He has had extensive evaluation and involvement by ID, ENT, GI and Nephrology. 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 He is currently on the ventilator, day 13, minimal improvement. PMT consulted to assist with goals of care. His prognosis is felt to be very poor for meaningful recovery.  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 He has 3 sisters, one younger brother, he has a mother who is living but has severe dementia and is in a facility, his father is living and I have spoken with him on the phone today and he has given permission for the patients three sisters to make decisions. His sisters say he didn't take care of his self when he was ill, he intermittently used drugs and smoked mariguana. They describe him as very proud and that he was "spoiled as a child". He lives with his younger sister who has essentially taken care of him an supported him after his mother went into a SNF.   FamHx:There is no specific family history of autoimmune disease or unusual illness, but they report their younger brother has Raynaud's Disease.   Summary of Goals of Care:  Family has complete consensus that Lawrence Jennings would not want to live in his current condition and most certainly would not want a life in a facility and  would essentially have no life if he could not sing or play the piano. They have elected to shift towards full comfort care. I explain in detail his current medical condition, prognosis and possible disease trajectories. I additionally discussed the process of removal of mechanical ventilation and comfort care.  1. DNR 2. Extubate to full comfort on Saturday 8/15 at 11AM 3. Maintain current level of care and provide comfort until Saturday ie; treat agitation, no weaning, keep seated and comfortable on vent, no need for cbgs or labs. 4. Discontinue antibiotics and any non essential medications. 5. Do not initiate CVVH or escalate interventions   Our team will follow closely.  Anderson Malta, DO Palliative Medicine [phone]

## 2013-12-15 NOTE — Progress Notes (Signed)
NUTRITION FOLLOW UP  Pt meets criteria for SEVERE MALNUTRITION in the context of chronic illness as evidenced by severe fat and muscle mass depletion.   DOCUMENTATION CODES  Per approved criteria   -Severe malnutrition in the context of chronic illness    Intervention:    When able to resume TF, utilize 33M PEPuP Protocol: initiate TF via NGT with Oxepa at 25 ml/h and Prostat 30 ml TID on day 1; on day 2, increase to goal rate of 45 ml/h (1080 ml per day) to provide 1920 kcals, 113 gm protein, 848 ml free water daily.  If patient continues to have intolerance to tube feeding, recommend start TPN if within goals of care.  Nutrition Dx:   Inadequate oral intake related to inability to eat as evidenced by NPO status, ongoing.  Goal:   Intake to meet >90% of estimated nutrition needs, unmet.  Monitor:   TF tolerance/adequacy, weight trend, labs, vent status.  Assessment:   Pt with FTT, psoriatic arthritis admitted with HCAP, AKI, acute lung injury, and septic shock. Pt on chronic steroids. He also has a history of hypertension and diabetes that has been poorly controlled.   Patient remains on the ARDS protocol. TF has been off and on due to problems with increased residuals > 500 ml. IR was unable to place NG tube in a post-pyloric position. Scheduled Reglan has been ordered. Plans for goals of care meeting with family today. If within goals of care, to attempt restart of TF with ongoing scheduled Reglan on 8/14.  Patient is currently intubated on ventilator support. MV: 12 L/min Temp (24hrs), Avg:98.2 F (36.8 C), Min:97.8 F (36.6 C), Max:98.7 F (37.1 C)  Propofol: none  Height: Ht Readings from Last 1 Encounters:  11/05/13 5' 11.5" (1.816 m)    Weight Status:  Up with positive fluid status Wt Readings from Last 1 Encounters:  12/15/13 209 lb 3.5 oz (94.9 kg)  12/03/13  144 lb 13.5 oz (65.7 kg)   Re-estimated needs:  Kcal: 1816 Protein: 100-115 gm Fluid: 2 L  Skin:  stage 2 pressure ulcer to buttocks  Diet Order: NPO   Intake/Output Summary (Last 24 hours) at 12/15/13 1116 Last data filed at 12/15/13 0800  Gross per 24 hour  Intake 657.88 ml  Output     41 ml  Net 616.88 ml    Last BM: 8/13   Labs:   Recent Labs Lab 12/13/13 0238 12/14/13 0450 12/15/13 0400  NA 137 140 140  K 3.7 3.8 3.8  CL 104 105 104  CO2 18* 16* 17*  BUN 57* 69* 82*  CREATININE 2.81* 3.29* 3.64*  CALCIUM 6.2* 6.4* 6.6*  MG 1.8 1.8 1.8  PHOS 5.4* 5.9* 6.8*  GLUCOSE 142* 104* 94    CBG (last 3)   Recent Labs  12/15/13 0007 12/15/13 0401 12/15/13 0756  GLUCAP 104* 95 91    Scheduled Meds: . antiseptic oral rinse  7 mL Mouth Rinse QID  . ceFEPime (MAXIPIME) IV  1 g Intravenous Q24H  . chlorhexidine  15 mL Mouth Rinse BID  . famotidine (PEPCID) IV  20 mg Intravenous Q24H  . hydrocortisone sod succinate (SOLU-CORTEF) inj  50 mg Intravenous Q12H  . insulin aspart  2-6 Units Subcutaneous 6 times per day  . ipratropium-albuterol  3 mL Nebulization Q6H  . metoCLOPramide (REGLAN) injection  5 mg Intravenous 4 times per day  . metronidazole  500 mg Intravenous Q8H  . sennosides  5 mL Per Tube  BID  . voriconazole  200 mg Per Tube Q12H    Continuous Infusions: . fentaNYL infusion INTRAVENOUS Stopped (12/15/13 0800)    Joaquin Courts, RD, LDN, CNSC Pager [phone] After Hours Pager [phone]

## 2013-12-15 NOTE — Progress Notes (Signed)
Subjective: Interval History: none.  Objective: Vital signs in last 24 hours: Temp:  [97.4 F (36.3 C)-98.7 F (37.1 C)] 98.4 F (36.9 C) (08/13 0402) Pulse Rate:  [121-134] 123 (08/13 0600) Resp:  [21-37] 35 (08/13 0347) BP: (87-113)/(52-74) 97/63 mmHg (08/13 0400) SpO2:  [88 %-100 %] 98 % (08/13 0600) Arterial Line BP: (93-151)/(50-77) 103/56 mmHg (08/13 0600) FiO2 (%):  [50 %-60 %] 50 % (08/13 0400) Weight:  [94.9 kg (209 lb 3.5 oz)] 94.9 kg (209 lb 3.5 oz) (08/13 0402) Weight change: -0.3 kg (-10.6 oz)  Intake/Output from previous day: 08/12 0701 - 08/13 0700 In: 873.7 [I.V.:473.7; IV Piggyback:400] Out: 55 [Urine:55] Intake/Output this shift: Total I/O In: 341.7 [I.V.:241.7; IV Piggyback:100] Out: 15 [Urine:15]  General appearance: nonresponsive on vent,dysconjugate gaze Resp: rales bilaterally and rhonchi bilaterally Cardio: S1, S2 normal and systolic murmur: holosystolic 2/6, blowing at apex GI: no bs, liver down 5 cm Extremities: edema 4+  Lab Results:  Recent Labs  12/14/13 0450 12/15/13 0400  WBC 17.2* 14.2*  HGB 7.2* 7.1*  HCT 22.3* 21.5*  PLT 72* 62*   BMET:  Recent Labs  12/14/13 0450 12/15/13 0400  NA 140 140  K 3.8 3.8  CL 105 104  CO2 16* 17*  GLUCOSE 104* 94  BUN 69* 82*  CREATININE 3.29* 3.64*  CALCIUM 6.4* 6.6*    Recent Labs  12/13/13 1814  PTH 328.1*   Iron Studies: No results found for this basename: IRON, TIBC, TRANSFERRIN, FERRITIN,  in the last 72 hours  Studies/Results: Dg Abd 1 View  12/13/2013   CLINICAL DATA:  Feeding tube placement.  EXAM: ABDOMEN - 1 VIEW  COMPARISON:  12/13/2013  FINDINGS: Attempt was made to pass feeding tube into the duodenum. Despite contrast injection and approximately 8 min of fluoroscopy time, the pylorus could not be traversed. Therefore, the feeding tube was left in the gastric antrum.  IMPRESSION: Attempted feeding tube placement by the technologist. The tube was left in the gastric antrum.    Electronically Signed   By: Jeronimo Greaves M.D.   On: 12/13/2013 16:24   Dg Abd Portable 1v  12/13/2013   CLINICAL DATA:  Ileus versus bowel obstruction.  EXAM: PORTABLE ABDOMEN - 1 VIEW  COMPARISON:  None.  FINDINGS: Partial visualization of the lungs demonstrates diffuse airspace disease at the bases with cavitation in the RIGHT mid lung.  Feeding tube is present with the tip in the proximal gastric fundus. Bilateral pleural effusions are present. No gross plain film evidence of free air. The bowel gas pattern appears nonobstructive with gas in the transverse colon. No dilated loops of small bowel are identified. Rectal third Mr. noted. Gas is present along the course of the descending colon. LEFT hip osteoarthritis.  IMPRESSION: 1. Nonobstructive bowel gas pattern. 2. Feeding tube with the tip in the gastric fundus. 3. Diffuse airspace disease in the lungs with cavitary RIGHT upper lobe lesion. Small bilateral pleural effusions.   Electronically Signed   By: Andreas Newport M.D.   On: 12/13/2013 09:36   Dg Vangie Bicker G Tube Plc W/fl-no Rad  12/13/2013   CLINICAL DATA: Failed attempt to feed r/t increased residuals   NASO G TUBE PLACEMENT WITH FLUORO  Fluoroscopy was utilized by the requesting physician.  No radiographic  interpretation.     I have reviewed the patient's current medications.  Assessment/Plan: 1 AKI  Drug induced picture.  Cannot say some hemodynamic component but not typical.  Oliguric,vol xs, acidemia stable.  Awaiting  GOC meeting to consider intervention. 2 VDRF Pneu and CNS 3 ? Sepsis on multiple AB, no definitive cultures 4 Nutrition none, await GOC 5 MS  ? CNS event, has focal findings 6 Psoriasis P AB as per ID, GOC meeting, proceed depending on that    LOS: 13 days   Garlen Reinig L 12/15/2013,6:54 AM

## 2013-12-16 DIAGNOSIS — N170 Acute kidney failure with tubular necrosis: Secondary | ICD-10-CM

## 2013-12-16 DIAGNOSIS — Z515 Encounter for palliative care: Secondary | ICD-10-CM

## 2013-12-16 MED ORDER — LORAZEPAM 2 MG/ML IJ SOLN
1.0000 mg/h | INTRAVENOUS | Status: DC
  Administered 2013-12-16: 1 mg/h via INTRAVENOUS
  Filled 2013-12-16: qty 25

## 2013-12-16 NOTE — Progress Notes (Addendum)
PULMONARY / CRITICAL CARE MEDICINE   Name: Lawrence Jennings MRN: [id number] DOB: [birth year]    ADMISSION DATE:  15-Dec-2013 CONSULTATION DATE:  12/03/13   REFERRING MD :  ED  CHIEF COMPLAINT:  AMS  INITIAL PRESENTATION: 58 y/o M with PMH of psoriatic arthritis on Humira + Pred, recent admission (7/3-7/7) for FTT & cellulitis (doxy) who presented to [facility] ER on 7/31 after being found by family with AMS & weakness.  Intubated for hypoxic respiratory failure / ALI.    PMH - mild thrombocytopenia of unknown etiology,diabetes that has been poorly controlled.   STUDIES:  8/2 - Echo EF 60% 8/04 - CT Chest with Pneumomediastinum and RLL cavitary lesion  SIGNIFICANT EVENTS: 8/01 - admitted to ICU for hypoxic respiratory failure 8/03 - levo gtt weaned off 8/06 - breath stacking, changed MV to pressure control, changed sedation, large/dark residuals from OGT 8/07 - small LUL PTX, worsening hypotension and CXR, ARDS protocol started, and paralytics, upper EGD with small gastric ulcers.  8/9 stopped nmb to prevent neuropathy and evaluate further need.  Sedated and  NMB stopped at 0830 12/12/13: On 50% fio2, peep 8; dysnchronous with ventilator. BP running soft. Heading into oligruia 12/13/13: Back on nimbex since yesterday. due to dysnchrony and fio2 improved to 50%, peep 5.  . BP responded to fluids but still oliguric; creat stabilizing. High TF residuals despite reglan and TF stopped. Started reglan scheduled  12/14/13: Off nimbex x 24h. Hypoxemia better; mild vent dysnchrony +. Re GI: unable to get post pyloric panda. Re renal: creat and bic worsening; meets CRRT indication per renal. Re family: goals of care set for tomorrow  12/15/13:  DNAR. For terminal wean 12/13/2013    SUBJECTIVE/OVERNIGHT/INTERVAL HX 12/16/13: For terminal wean tomorrow   VITAL SIGNS: Temp:  [97.7 F (36.5 C)-98.8 F (37.1 C)] 97.8 F (36.6 C) (08/14 1241) Pulse Rate:  [111-122] 111 (08/14 1215) Resp:  [30-31] 30  (08/14 1109) BP: (84-118)/(50-67) 88/66 mmHg (08/14 1300) SpO2:  [91 %-100 %] 93 % (08/14 1215) Arterial Line BP: (85-98)/(52-58) 86/52 mmHg (08/13 2000) FiO2 (%):  [50 %] 50 % (08/14 1200) Weight:  [95 kg (209 lb 7 oz)] 95 kg (209 lb 7 oz) (08/14 0351)  VENTILATOR SETTINGS: Vent Mode:  [-] PRVC FiO2 (%):  [50 %] 50 % Set Rate:  [30 bmp] 30 bmp Vt Set:  [450 mL] 450 mL PEEP:  [10 cmH20] 10 cmH20 Plateau Pressure:  [19 cmH20-31 cmH20] 23 cmH20  INTAKE / OUTPUT:  Intake/Output Summary (Last 24 hours) at 12/16/13 1336 Last data filed at 12/16/13 0800  Gross per 24 hour  Intake    580 ml  Output      0 ml  Net    580 ml    PHYSICAL EXAMINATION: General: looks critically ill Neuro:  Deeply sedated  Fentanyl  HEENT:  Proptosis, PERRL, EOMI, OP clear, dry mucosa, subcutaneous air (crepitus) no change 8/9,multiple missing teeth Cardiovascular:  Tachycardic, regular rhythm, no MRG Lungs: Decreased bs bases, + rhonchi bilateral ,  Diminished BS, no HSM.  Musculoskeletal:  +pedal edema,. Rt Foot cold and purplish toes + Skin:   hyperpigmented areas on face and chest   LABS:  PULMONARY  Recent Labs Lab 12/12/13 1502 12/12/13 2205 12/13/13 0352 12/14/13 1158 12/15/13 0358  PHART 7.293* 7.215* 7.225* 7.250* 7.241*  PCO2ART 40.1 47.7* 45.2* 40.6 41.0  PO2ART 50.0* 69.5* 71.0* 59.0* 64.4*  HCO3 19.6* 18.6* 18.1* 17.8* 17.0*  TCO2 21 20.1 19.4 19 18.3  O2SAT 82.0 91.2 91.7 86.0 89.0    CBC  Recent Labs Lab 12/13/13 0238 12/14/13 0450 12/15/13 0400  HGB 7.3* 7.2* 7.1*  HCT 22.1* 22.3* 21.5*  WBC 17.9* 17.2* 14.2*  PLT 64* 72* 62*    COAGULATION  Recent Labs Lab 12/12/13 0400  INR 1.47    CARDIAC  No results found for this basename: TROPONINI,  in the last 168 hours  Recent Labs Lab 12/14/13 0450  PROBNP 2132.0*     CHEMISTRY  Recent Labs Lab 12/12/13 0400 12/12/13 1504 12/12/13 2157 12/13/13 0238 12/14/13 0450 12/15/13 0400  NA 143 141 139  137 140 140  K 3.9 4.0 3.7 3.7 3.8 3.8  CL 107 107 105 104 105 104  CO2 20 19 18* 18* 16* 17*  GLUCOSE 115* 150* 149* 142* 104* 94  BUN 50* 55* 56* 57* 69* 82*  CREATININE 2.61* 2.80* 2.77* 2.81* 3.29* 3.64*  CALCIUM 6.6* 6.4* 6.2* 6.2* 6.4* 6.6*  MG 1.5  --   --  1.8 1.8 1.8  PHOS 5.3*  --   --  5.4* 5.9* 6.8*   Estimated Creatinine Clearance: 27 ml/min (by C-G formula based on Cr of 3.64).    LIVER  Recent Labs Lab 12/12/13 0400 12/15/13 0400  AST  --  80*  ALT  --  17  ALKPHOS  --  139*  BILITOT  --  0.6  PROT  --  5.1*  ALBUMIN  --  1.0*  INR 1.47  --      INFECTIOUS  Recent Labs Lab 12/10/13 1205  LATICACIDVEN 2.0     ENDOCRINE CBG (last 3)   Recent Labs  12/15/13 0756 12/15/13 1236 12/15/13 1622  GLUCAP 91 86 86         IMAGING x48h Dg Chest Port 1 View  12/15/2013   CLINICAL DATA:  Intubated patient.  EXAM: PORTABLE CHEST - 1 VIEW  COMPARISON:  Plain film of the chest 12/13/2013 and 12/12/2013.  FINDINGS: Support tubes and lines are unchanged. Left worse than right airspace disease persists but appears improved over the past 2 days. There appears to be a left pleural effusion. No pneumothorax is identified. Cavitary lesion in the right mid lung is again seen. Gas the soft tissues of the neck compatible with pneumomediastinum seen on prior studies also appears improved.  IMPRESSION: Persistent but improved bilateral airspace disease compatible with ARDS or multifocal pneumonia.  Improvement pneumomediastinum.   Electronically Signed   By: Drusilla Kanner M.D.   On: 12/15/2013 08:01       ASSESSMENT / PLAN:  PULMONARY ETT December 14, 2013 >> A:  Acute Hypoxic Resp Failure/ARDS - s/p 72h nimbex ending 12/13/13 Sepsis\Cavitation on CT with pneumomediastium, concern for Fungal infection Small LUL PTX   - On 8/14/5:   ARDS persists     P:   ARDS protocol to continue; pulse ox goal > 92% PTX is small, but with hypoxia will have to increase PEEP  and may place chest tube - for now will monitor with serial cxr and hemodynamics Terminal wean 12/16/13  CARDIOVASCULAR CVL:8/1 l i j cvl>> A:  Septic shock - levophed gtt weaned off 8/3 am , currently off pressors Hypotension - on versed and fentanyl   - off pressors since 12/12/13  P:  D5 half normal saline at 50cc/h  RENAL A:   AKI - Likely secondary to volume depletion and hypotension, worsening.Normal renal US 12/11/13   - worsening renal function 12/15/13. Drug induced  per  renal.  P:   No crrt  GASTROINTESTINAL A:   Vent associated Dysphagia  ? Aspiration component Increased Gastric Residual / ABD Distention Hypertriglyceridemia    - Ongoing high TF residuals recurrence 12/13/13 and failed to get post pyloric panda in (high TF around 12/05/13, Sp UGI08/25/2015  scope with multiple gastric ulcers related to OG trauma with recommendation to use doboff)    P:   No tube feeds   HEMATOLOGIC A:   Thrombocytopenia - Mild - unknown etiology.  Noted since July 2015 Anemia Hgb 7.0 8/9   - improving platelet count 12/13/13 P:  Trend CBC, monitor platelets, avoid toxic drugs.  Transfuse for hgb <7.0 SCDs   INFECTIOUS A:   HCAP Sputum 12/03/13 with Group A beta hemolytic strep, 12/07/13 with Klebsiella- in immunocompromised patient (Humira and prednisone)  P:   Rx HCAP now with Cavitary lesion, high concern for fungal infection  BCx2 7/31 >> ng UC 8/1 >> ng Sputum 8/5 >> K. Pneumonia BAL 12/03/2013 >> 91% polys, culture negative  Abx: Dc all abx  ENDOCRINE A:   DM II Chronic Steroid Administration  (pharmacy investigation on 12/14/13: patient was being tapered off prednisone and should have completed by end July)  Proptosis - TSH nml  P:   Dc ssi Dc hyrocort contnue lacrilube  NEUROLOGIC A:   AMS, No diprivan due to high TGL    - RASS -3 on fent gtt  P:   RASS goal: -3 Daily WUA Fentanyl gtt PRN versed Might need low dose ativan gtt as patient heads into  terminal wean   GLOBAL 12/12/13: No family at bedside. Full code 12/13/13: Per RN: a sister is listed as contact but since admission 11/13/2013 no family visits 12/13/13:  One sister  updated by PCCM MD over phone . Palliative care consult for goals called for 12/15/13 12/15/13: Palliative care goals today 12/16/13: for terminal wean 12/24/2013   TODAY'S SUMMARY: 59 y/o with FTT, psoriatic arthritis admitted with HCAP, AKI, resolved septic shock, RLL\RML cavitation with pneumomediastium on CT chest and LUL PTX on CXR, correcting Hyponatremia\dehydration, treating HCAP . On ARDS protocol . Abx per ID.  Main issues a) renal failure - needs CRRT ; b) high TF residual - rx reglan for few days and then start TF; c) acute heading into chronic resp failure - needs trach; d) low platelets - improving; e) overall very poor prognosis and unlilkely will survive 6-12 months; needs goals of care. Pall goals of care 12/14/13: for terminal wean 01/02/2014   The patient is critically ill with multiple organ systems failure and requires high complexity decision making for assessment and support, frequent evaluation and titration of therapies, application of advanced monitoring technologies and extensive interpretation of multiple databases.   Critical Care Time devoted to patient care services described in this note is  35  Minutes.    Dr. Kalman Shan, M.D., [facility]C.P Pulmonary and Critical Care Medicine Staff Physician [facility]monary and Critical Care Pager: [phone], If no answer or between  15:00h - 7:00h: call [phone]  12/16/2013 1:36 PM

## 2013-12-16 NOTE — Progress Notes (Signed)
Patient ID: Lawrence Jennings, male   DOB: 01-16-1956, 58 y.o.   MRN: [id number]         [facility]    Date of Admission:  11/30/2013     Assessment: I note family's decision to transition Mr. Labreck to comfort care.  Plan: 1. Recommend discontinuing cefepime, metronidazole and voriconazole 2. I will sign off now  Cliffton Asters, MD [facility] [phone] pager   [phone] cell 12/16/2013, 10:34 AM

## 2013-12-16 NOTE — Progress Notes (Signed)
Patient ID: Lawrence Jennings, male   DOB: 11-23-1955, 58 y.o.   MRN: [id number] Will s/o at this time & support only.

## 2013-12-17 LAB — GLUCOSE, CAPILLARY: Glucose-Capillary: 98 mg/dL (ref 70–99)

## 2013-12-17 MED ORDER — FENTANYL CITRATE 0.05 MG/ML IJ SOLN: 100.0000 ug | INTRAMUSCULAR | Status: DC | PRN

## 2013-12-18 LAB — CRYOGLOBULIN

## 2014-01-02 LAB — FUNGUS CULTURE W SMEAR: Fungal Smear: NONE SEEN

## 2014-01-03 NOTE — Progress Notes (Signed)
Per previously defined plan, terminal extubation ordered this AM No charge for this encounter  Billy Fischer, MD ; [facility] service Mobile [phone].  After 5:30 PM or weekends, call [phone]

## 2014-01-03 NOTE — Progress Notes (Signed)
Patient was terminally extubated at 1142 per family request. Family was at bedside after extubation, patient was kept comfortable, patient expired at 1238. No pulse, no blood pressure, no heart beat on auscultation, no visible respirations. Patient was pronounce dead at 12.38pm. Family was given time to grief with the body and at 1428 the body was transferred to the morgue.

## 2014-01-03 NOTE — Progress Notes (Signed)
Withdrawal of life guideline/protocol initiated per family request.  Pt extubated to room air.  RN present t/o entire process, pt appeared comfortable t/o.

## 2014-01-03 NOTE — Progress Notes (Signed)
Chaplain responded to page concerning withdrawal, family requested Chaplain to be present. Met with family in conference room. Very personable people, Chaplain engaged in empathic listening, emotional and spiritual support.For this kind of situation, family members present handling it notably well. Arrangements have already been prepared by the family. Eldest sister noted that they have a very small family yet the pt was very widely known therefore the family will have plenty support from friends and church families. Family grateful for the visit. Will page if needed.  Delford Field

## 2014-01-03 DEATH — deceased

## 2014-01-23 NOTE — Discharge Summary (Signed)
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 patient with    has a past medical history of Allergic reaction; Sinus disease; Back pain; Arthritis; Edema; Hypertension; and Diabetes mellitus without complication.   has past surgical history that includes No past surgeries and Esophagogastroduodenoscopy (N/A, 2014-01-06).   Admitted on 11/21/2013 with    58 y/o M with PMH of psoriatic arthritis on Humira + Pred, recent admission (7/3-7/7) for FTT & cellulitis (doxy) who presented to [facility] ER on 7/31 after being found by family with AMS & weakness. Intubated for hypoxic respiratory failure / ALI.    COURSE   8/01 - moved  to ICU for hypoxic respiratory failure  8/03 - levo gtt weaned off  8/06 - breath stacking, changed MV to pressure control, changed sedation, large/dark residuals from OGT  8/07 - small LUL PTX, worsening hypotension and CXR, ARDS protocol started, and paralytics, upper EGD with small gastric ulcers.  8/9 stopped nmb to prevent neuropathy and evaluate further need. Sedated and NMB stopped at 0830  12/12/13: On 50% fio2, peep 8; dysnchronous with ventilator. BP running soft. Heading into oligruia  12/13/13: Back on nimbex since yesterday. due to dysnchrony and fio2 improved to 50%, peep 5. . BP responded to fluids but still oliguric; creat stabilizing. High TF residuals despite reglan and TF stopped. Started reglan scheduled  12/14/13: Off nimbex x 24h. Hypoxemia better; mild vent dysnchrony +. Re GI: unable  to get post pyloric panda. Re renal: creat and bic worsening; meets CRRT indication per renal. Re family: goals of care set for tomorrow  12/15/13: DNAR. For terminal wean 12/11/2013 based on family discussion 12/16/13: For terminal wean tomorrow    Then on 12/24/2013 patient terminally weaned to comfort and patient expired     SIGNED Dr. Kalman Shan, M.D., [facility].C.P Pulmonary and Critical Care Medicine Staff Physician [facility]monary and Critical Care Pager: [phone], If no answer or between  15:00h - 7:00h: call [phone]  01/23/2014 8:48 AM

## 2016-02-04 IMAGING — RF DG ESOPHAGUS
15 of 24 series · 15 of 24 positions shown · non-contrast
Comparison: None.

CLINICAL DATA: Difficulty swallowing.

EXAM:
ESOPHOGRAM / BARIUM SWALLOW / BARIUM TABLET STUDY
TECHNIQUE: Combined double contrast and single contrast examination performed
using effervescent crystals, thick barium liquid, and thin barium
liquid. The patient was observed with fluoroscopy swallowing a 13mm
barium sulphate tablet.
FLUOROSCOPY TIME:  1 min, 26 seconds.

[Series 1: run · 1 of 13 slices shown (1 of 15)]
[im 1/13]
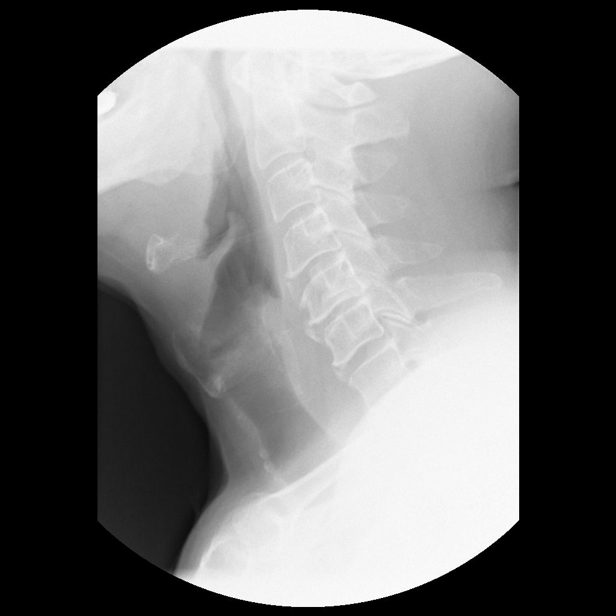

[Series 1: run · 1 of 13 slices shown (2 of 15)]
[im 1/13]
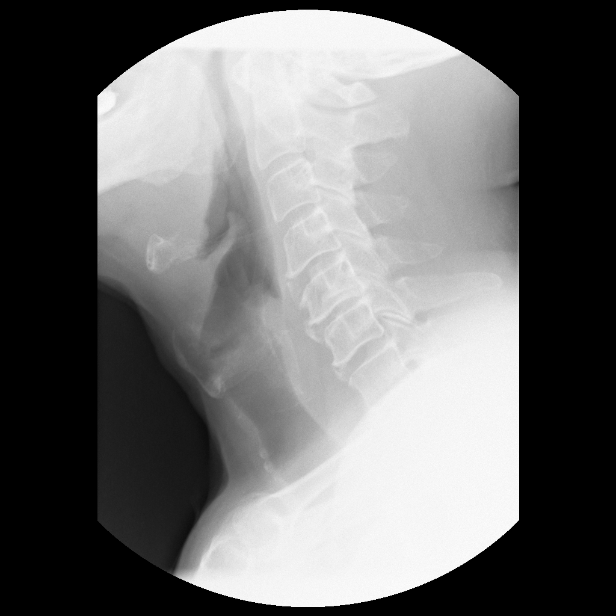

[Series 2: run · 1 of 3 slices shown (3 of 15)]
[im 1/3]
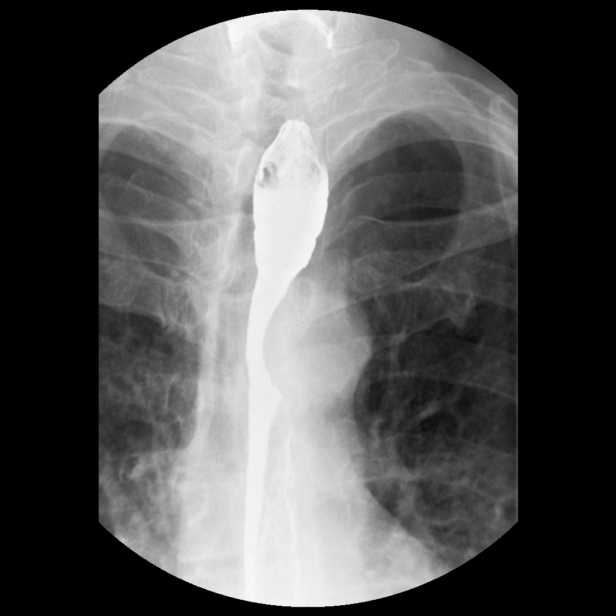

[Series 2: run · 1 of 3 slices shown (4 of 15)]
[im 1/3]
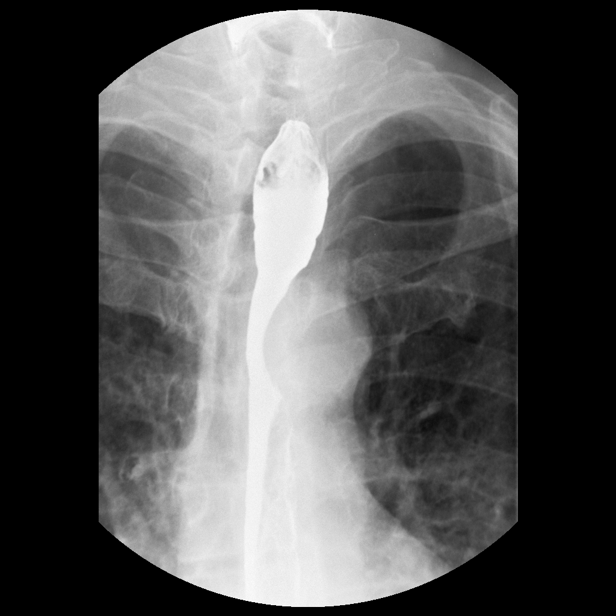

[Series 3: run · 1 of 1 slices shown (5 of 15)]
[im 1/1]
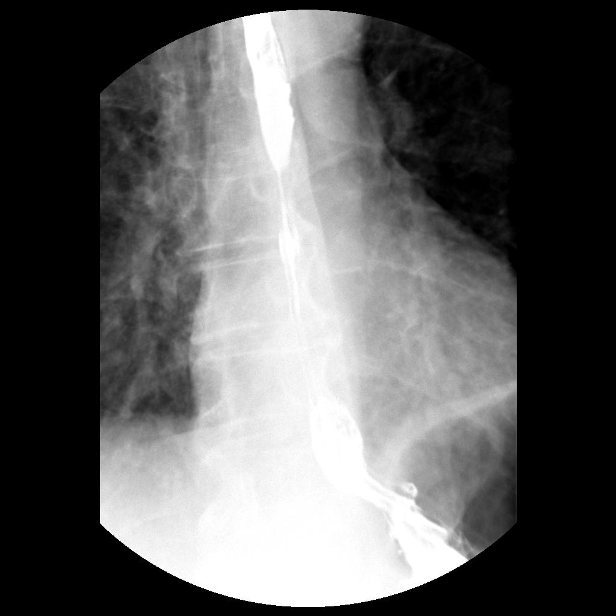

[Series 3: run · 1 of 1 slices shown (6 of 15)]
[im 1/1]
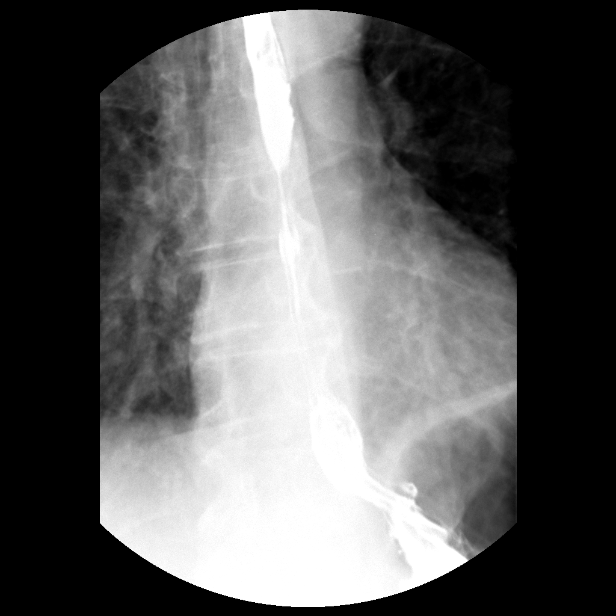

[Series 4: run · 1 of 1 slices shown (7 of 15)]
[im 1/1]
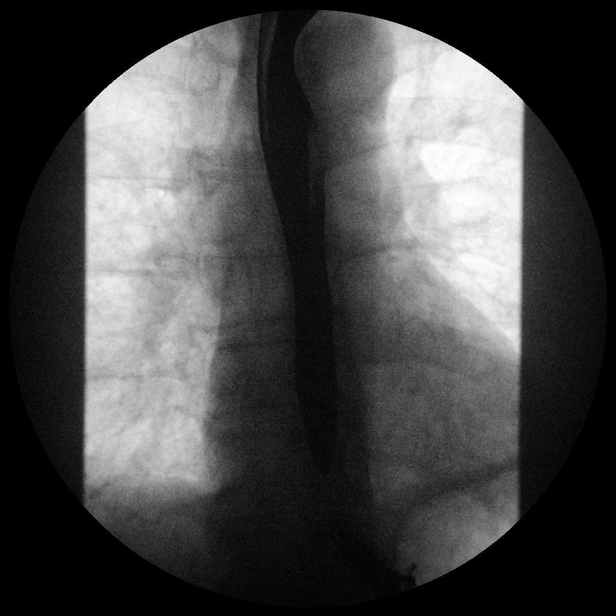

[Series 5: run · 1 of 1 slices shown (8 of 15)]
[im 1/1]
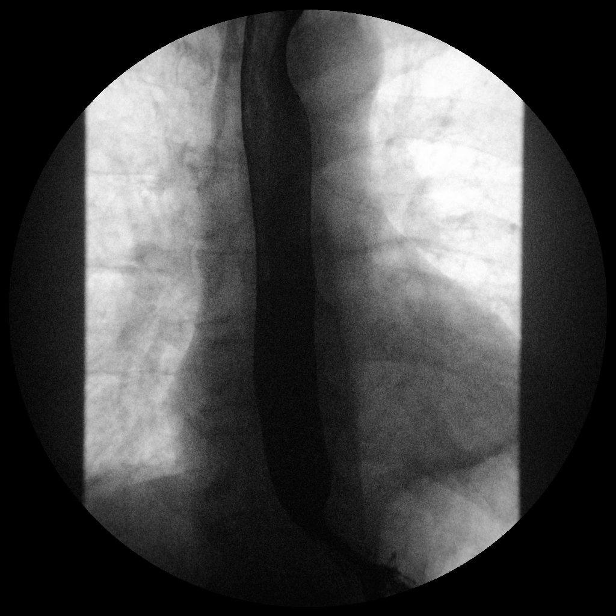

[Series 5: run · 1 of 1 slices shown (9 of 15)]
[im 1/1]
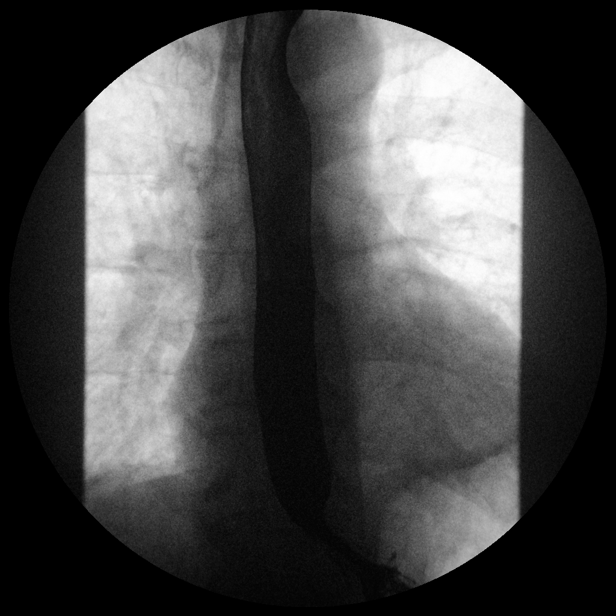

[Series 6: run · 1 of 1 slices shown (10 of 15)]
[im 1/1]
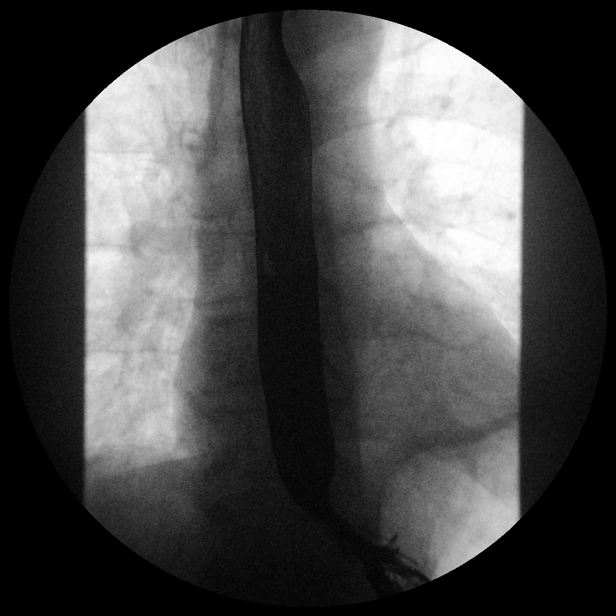

[Series 6: run · 1 of 1 slices shown (11 of 15)]
[im 1/1]
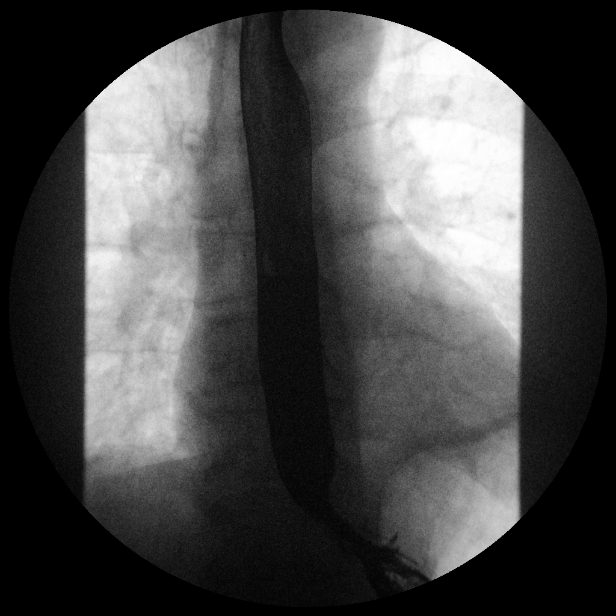

[Series 7: run · 1 of 1 slices shown (12 of 15)]
[im 1/1]
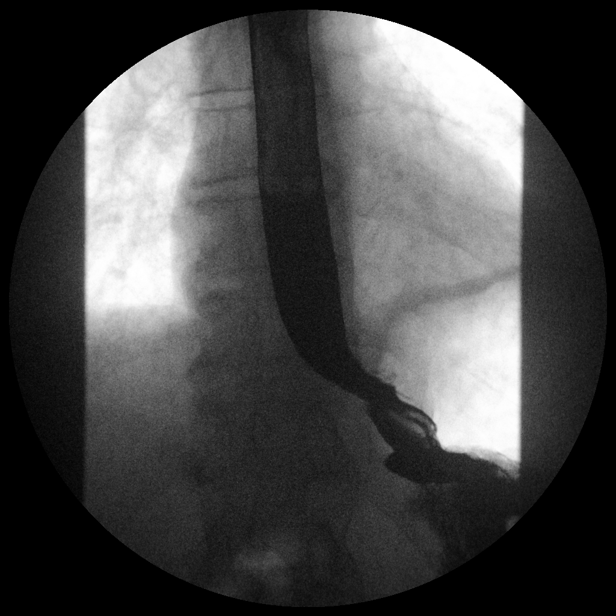

[Series 7: run · 1 of 1 slices shown (13 of 15)]
[im 1/1]
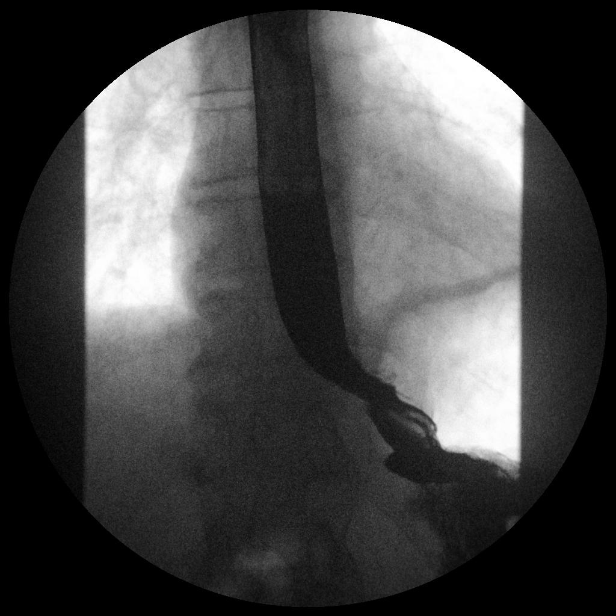

[Series 8: run · 1 of 1 slices shown (14 of 15)]
[im 1/1]
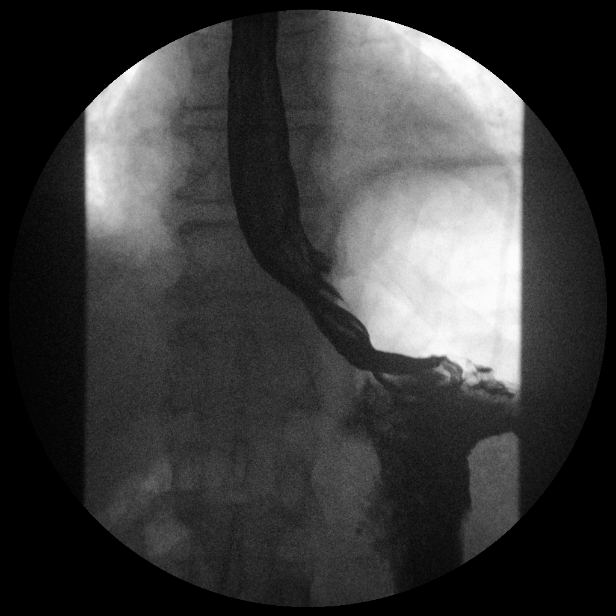

[Series 8: run · 1 of 1 slices shown (15 of 15)]
[im 1/1]
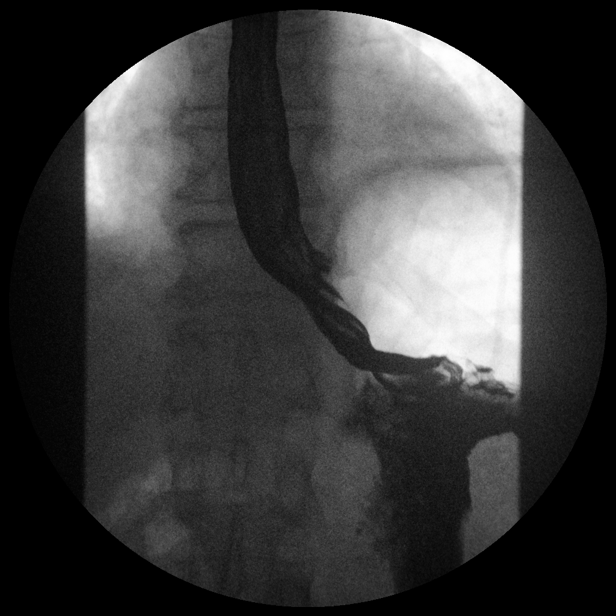

[15 of 24 positions shown; findings below may reference images not displayed]

FINDINGS: Swallowing function appeared normal without aspiration or
penetration. The esophagus also appears normal without stricture,
mass or inflammatory change. No gastroesophageal reflux was
elicited. No hiatal hernia is visualized. Esophageal motility is
unremarkable. 13 mm barium tablet passed easily into the stomach.
IMPRESSION: Negative examination.
